# Patient Record
Sex: Female | Born: 1985 | Race: White | Hispanic: No | Marital: Single | Smoking: Never smoker
Health system: Southern US, Community
[De-identification: ages and names within clinical notes are randomized; demographics above are authoritative.]

## PROBLEM LIST (undated history)

## (undated) ENCOUNTER — Inpatient Hospital Stay: Payer: Self-pay

## (undated) DIAGNOSIS — F419 Anxiety disorder, unspecified: Secondary | ICD-10-CM

## (undated) DIAGNOSIS — Z23 Encounter for immunization: Secondary | ICD-10-CM

## (undated) DIAGNOSIS — K219 Gastro-esophageal reflux disease without esophagitis: Secondary | ICD-10-CM

## (undated) DIAGNOSIS — F339 Major depressive disorder, recurrent, unspecified: Secondary | ICD-10-CM

## (undated) DIAGNOSIS — T7840XA Allergy, unspecified, initial encounter: Secondary | ICD-10-CM

## (undated) DIAGNOSIS — J45909 Unspecified asthma, uncomplicated: Secondary | ICD-10-CM

## (undated) DIAGNOSIS — I881 Chronic lymphadenitis, except mesenteric: Secondary | ICD-10-CM

## (undated) DIAGNOSIS — R040 Epistaxis: Secondary | ICD-10-CM

## (undated) DIAGNOSIS — F1411 Cocaine abuse, in remission: Secondary | ICD-10-CM

## (undated) DIAGNOSIS — F900 Attention-deficit hyperactivity disorder, predominantly inattentive type: Secondary | ICD-10-CM

## (undated) DIAGNOSIS — F329 Major depressive disorder, single episode, unspecified: Secondary | ICD-10-CM

## (undated) DIAGNOSIS — Z8041 Family history of malignant neoplasm of ovary: Secondary | ICD-10-CM

## (undated) DIAGNOSIS — F4310 Post-traumatic stress disorder, unspecified: Secondary | ICD-10-CM

## (undated) DIAGNOSIS — G44209 Tension-type headache, unspecified, not intractable: Secondary | ICD-10-CM

## (undated) DIAGNOSIS — Z789 Other specified health status: Secondary | ICD-10-CM

## (undated) HISTORY — DX: Anxiety disorder, unspecified: F41.9

## (undated) HISTORY — DX: Allergy, unspecified, initial encounter: T78.40XA

## (undated) HISTORY — DX: Attention-deficit hyperactivity disorder, predominantly inattentive type: F90.0

## (undated) HISTORY — DX: Cocaine abuse, in remission: F14.11

## (undated) HISTORY — DX: Major depressive disorder, recurrent, unspecified: F33.9

## (undated) HISTORY — DX: Encounter for immunization: Z23

## (undated) HISTORY — DX: Chronic lymphadenitis, except mesenteric: I88.1

## (undated) HISTORY — DX: Family history of malignant neoplasm of ovary: Z80.41

## (undated) HISTORY — DX: Post-traumatic stress disorder, unspecified: F43.10

## (undated) HISTORY — DX: Major depressive disorder, single episode, unspecified: F32.9

## (undated) HISTORY — DX: Tension-type headache, unspecified, not intractable: G44.209

## (undated) HISTORY — PX: LYMPHADENECTOMY: SHX5960

## (undated) HISTORY — DX: Gastro-esophageal reflux disease without esophagitis: K21.9

## (undated) HISTORY — DX: Epistaxis: R04.0

---
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
## 2008-02-18 ENCOUNTER — Emergency Department (HOSPITAL_COMMUNITY): Admission: EM | Admit: 2008-02-18 | Discharge: 2008-02-18 | Payer: Self-pay | Admitting: Emergency Medicine
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
## 2008-03-09 ENCOUNTER — Emergency Department: Payer: Self-pay | Admitting: Emergency Medicine
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
## 2008-07-22 ENCOUNTER — Ambulatory Visit: Payer: Self-pay | Admitting: Unknown Physician Specialty
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
## 2008-07-30 ENCOUNTER — Ambulatory Visit: Payer: Self-pay | Admitting: Unknown Physician Specialty

## 2008-08-07 ENCOUNTER — Ambulatory Visit: Payer: Self-pay | Admitting: Unknown Physician Specialty

## 2008-09-14 ENCOUNTER — Emergency Department: Payer: Self-pay | Admitting: Emergency Medicine

## 2008-09-17 ENCOUNTER — Emergency Department: Payer: Self-pay | Admitting: Emergency Medicine

## 2008-11-21 HISTORY — PX: LAPAROSCOPY: SHX197

## 2008-12-28 ENCOUNTER — Emergency Department: Payer: Self-pay | Admitting: Emergency Medicine
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
## 2009-08-12 ENCOUNTER — Emergency Department: Payer: Self-pay | Admitting: Emergency Medicine
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
## 2009-08-14 ENCOUNTER — Other Ambulatory Visit: Payer: Self-pay | Admitting: Unknown Physician Specialty

## 2010-04-14 ENCOUNTER — Inpatient Hospital Stay: Payer: Self-pay | Admitting: Obstetrics and Gynecology

## 2010-04-19 ENCOUNTER — Ambulatory Visit: Payer: Self-pay

## 2010-06-28 ENCOUNTER — Emergency Department: Payer: Self-pay | Admitting: Emergency Medicine

## 2011-01-18 ENCOUNTER — Encounter: Payer: Self-pay | Admitting: Internal Medicine

## 2011-01-20 ENCOUNTER — Encounter: Payer: Self-pay | Admitting: Internal Medicine

## 2011-02-20 ENCOUNTER — Encounter: Payer: Self-pay | Admitting: Internal Medicine

## 2011-04-08 NOTE — Discharge Summary (Signed)
NAME:  [name], [name]                         ACCOUNT NO.:  [id number]   MEDICAL RECORD NO.:  [id number]                   PATIENT TYPE:  INP   LOCATION:  0105                                 FACILITY:  [facility]   PHYSICIAN:  Beverly Milch, MD                  DATE OF BIRTH:  05-17-1986   DATE OF ADMISSION:  03/29/2004  DATE OF DISCHARGE:  04/05/2004                                 DISCHARGE SUMMARY   IDENTIFYING DATA:  This 25 year old female 10th grade student at [employer] in [place] was admitted voluntarily emergently on  referral from [facility] for inpatient stabilization  of suicide risk, depression, and medical and psychological consequences of  alcohol.  She reported a several month history of progressive depression.  She discontinued her Depakote three months ago for bipolar disorder and  declined to resume it, stating she could not tolerate that medication.  She  is in therapy with [name] with last appointment Mar 24, 2004.  She had  a suicide plan to cut herself and had last cut one year ago.  For full  details, please see the typed admission assessment.   HISTORY OF PRESENT ILLNESS:  The patient would elaborate little on the above  history.  She seemed to likely receive pharmacotherapy in the past from  Leim Fabry.  She noted that she does take Seroquel episodically though  mainly now just for sleep when needed but not regularly, usually taking two  or three of the 25 mg tablets.  She had been treated with Effexor, Prozac,  and Zoloft in the past and noted that antidepressant did not seem to work.  She had been hospitalized at [facility] in March 2004.  Her last  heavy drinking episode was Mar 28, 2004, and she notes a 6 pound weight loss,  upset stomach, and inability to sleep with low energy that seemed likely  consequences of alcohol and mood disorder.  She expects withdrawal symptoms.  There is a family  history of substance abuse apparently in both parents who  also have depression but they were unaware of the patient's drinking.  Both  parents are currently sober and extended family has a history of substance  abuse as well.  Mother is taking lorazepam and Xanax for her depression.  Father apparently is on no medications.  Both parents have significant  depression associated with the death of the patient's brother when the  patient was 68 years of age and the brother was 8 in 55, being run over by  a school bus.  The patient had breakup with a boyfriend of five months February 29, 2004.   INITIAL MENTAL STATUS EXAM:  The patient had moderate to severe dysphoria  and no manic or mixed features.  She had atypical and melancholic features.  She tended to be quiet and reserved while also self-defeating  in her careful  revelation of partial information.  She did acknowledge suicidal ideation.  She had been fearful in the past of something in her room and would check  under the bed but had stopped doing this three months ago.  She was binge  drinking including vodka heavily for the last few months and started alcohol  at age 72.  She would not discuss other drugs much but subsequently later  did admit to using ecstasy at times.  Any withdrawal symptoms seemed to be  also possibly flashbacks from previous ecstasy use.   LABORATORY FINDINGS:  CBC was normal except hematocrit borderline low at  34.7 with reference range 36-49 and hemoglobin borderline at 12.1 with  reference range 12-16.  White count was normal at 6800, MCV at 87, and  platelet count 293,000.  Comprehensive metabolic panel was normal with  sodium 135, potassium 3.8, glucose 78, creatinine 0.7, calcium 9.4, albumin  3.5, AST 16, and ALT 17.  GGT was normal at 7.  TSH was normal at 0.605.  RPR was nonreactive.  Urine drug screen was positive for propoxyphene,  otherwise negative at the time of admission with propoxyphene  quantitated at  2600 ng/mL.  The patient then did clarify that mother had given her one of  mother's Darvocet's for back pain before admission.  Urine pregnancy test  was negative.  Urinalysis was normal except for a poor clean catch with  small amount of leukocyte esterase, many epithelial and bacteria, 3-6 wbc's,  presence of mucus, and specific gravity of 1.018.  She was asymptomatic in  this regard.  Urine probe for gonorrhea and Chlamydia trachomatis by DNA  amplification were both negative.   HOSPITAL COURSE AND TREATMENT:  General medical exam by Vic Ripper,  P.A.-C., noted that the patient takes Sudafed and her birth control pill.  She allergic to PENICILLIN.  She noted that her paternal first cousin has  bipolar disorder.  She had menarche at age 51.  She had scars on the right  thigh from previous self-cutting and had her last GYN exam six months ago  with Dr. Dayna Barker.  Vital signs were normal throughout hospital stay with  height 62 inches and weight 105 pounds on admission, blood pressure 116/80  with heart rate of 107 sitting and standing blood pressure 121/82 with heart  rate 112.  At the time of discharge, the patient's weight was 106.5 pounds  and her blood pressure was 110/77 with heart rate 98 sitting and 108/75 with  heart rate of 128 standing.  The patient had several times of complaining of  shakiness, apprehensions about capacity to control herself, inability to  sleep, and vague misperceptions associated with craving alcohol that seemed  to be low dose withdrawal symptoms and flashbacks of intoxication combined.  She did not manifest a physiologic withdrawal requiring benzodiazepines but  was monitored closely for such.  The patient required to sleep in the quiet  room next to the nursing station on one of these occasions.  She was started  on Cymbalta 20 mg in the morning and Lamictal 25 mg at bedtime for treatment of her bipolar depression.  She did not  tolerate Lamictal as she reportedly  had muscle fatigue and aches that caused she and mother to feel would be  only due to the Lamictal.  She was switched to Neurontin and grandmother  does take Neurontin okay.  Neurontin was titrated up to 1200 mg nightly and  tolerated well.  She still had some difficulty sleeping toward the end of  the hospitalization and anticipated the need to use her Seroquel p.r.n. at  home for this.  Family therapy was carried out with mother.  The patient  participated in substance abuse, group, milieu, behavioral, individual,  special education, anger management, and occupational therapeutic  recreational therapies during the hospital stay.  She and mother addressed  the changes in lifestyle at home for the patient.  The patient feels  apprehensive about not being able to go out with her friends when she wants  to but mother is being more structured about ensuring sobriety.  The patient  seems to appreciate mother's concern as well.  Mother had to go to court on  the day of discharge so that the family therapy session at the time of  discharge was brief.  The patient pledged compliance and demonstrated  knowledge in what she has to do to continue her recovery.  She was  discharged in improved condition free of suicidal ideation.   FINAL DIAGNOSES:   AXIS I:  1. Bipolar disorder, depressed, severe.  2. Alcohol dependence.  3. Psychoactive substance abuse, not otherwise specified.  4. Noncompliance with treatment.  5. Identity disorder with passive-aggressive features.  6. Other specified family circumstances.  7. Parent-child problem.  8. Other interpersonal problems.   AXIS II:  Diagnosis deferred.   AXIS III:  1. Allergy to penicillin.  2. Seasonal allergic rhinitis.  3. Birth control pills.  4. Weight loss.  5. History of fibrocystic disease.  6. Possible mild alcohol gastritis.  7. Borderline anemia.   AXIS IV:  Stressors: Family- severe,  chronic; peer relations and school-  moderate, acute; phase of life- severe, chronic.   AXIS V:  Global assessment of functioning at the time of admission was 35  with highest in the last year 65 and discharge global assessment of  functioning 54.   PLAN:  The patient was making excellent progress at the time of discharge.  She was more sincere in her self-direction of sobriety and recovery as well  as being able to communicate better with parents.  She had no suicidal  ideation at the time of discharge including no medication associated suicide  side effects.  She was discharged on the following medications:  1. Cymbalta 20 mg every morning, quantity #30 with one refill prescribed.  2. Neurontin 600 mg tablet to take two tablets or 1200 mg every bedtime,     quantity #60 with one refill.  3. Seroquel 25 mg tablet using two at bedtime if needed for insomnia, having     a supply at home.  4. Birth control pill to take one daily, own home supply.  AA was recommended and the patient and mother committed that the patient  would attend.  The patient will see [name] for therapy with  appointment to be made as of Apr 08, 2004, as the office is closed until  that time for individual and family therapy as well as substance abuse  treatment.  She will see Jasmine Pang, M.D., for psychiatric followup  and that office will also need to schedule the patient appointment with  mother though appointment was facilitated in every way possible.  Crisis and  safety plans are established if needed.  She is discharged on a bland but  progressive diet and has no restrictions on activity.  There is a signed  release on the chart for both courtesy copies.  Beverly Milch, MD    GJ/MEDQ  D:  04/06/2004  T:  04/06/2004  Job:  [id number]   cc:   Jasmine Pang, M.D.  Fax: [phone]   [name]  [facility]  [address] [place], [place] [address]
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
## 2011-04-22 ENCOUNTER — Emergency Department: Payer: Self-pay | Admitting: Unknown Physician Specialty

## 2011-06-06 ENCOUNTER — Ambulatory Visit: Payer: Self-pay | Admitting: Internal Medicine

## 2011-06-22 ENCOUNTER — Ambulatory Visit: Payer: Self-pay | Admitting: Internal Medicine

## 2011-07-12 ENCOUNTER — Emergency Department: Payer: Self-pay | Admitting: *Deleted

## 2011-09-09 ENCOUNTER — Ambulatory Visit: Payer: Self-pay | Admitting: Internal Medicine

## 2011-09-09 IMAGING — US TRANSABDOMINAL ULTRASOUND OF PELVIS
1 series · 17 of 25 positions shown · non-contrast
Comparison: none

REASON FOR EXAM: pelvic pain
COMMENTS:

[Series 1: transabdominal ultrasound of pelvis · 17 of 76 slices shown]
[im 1/76]
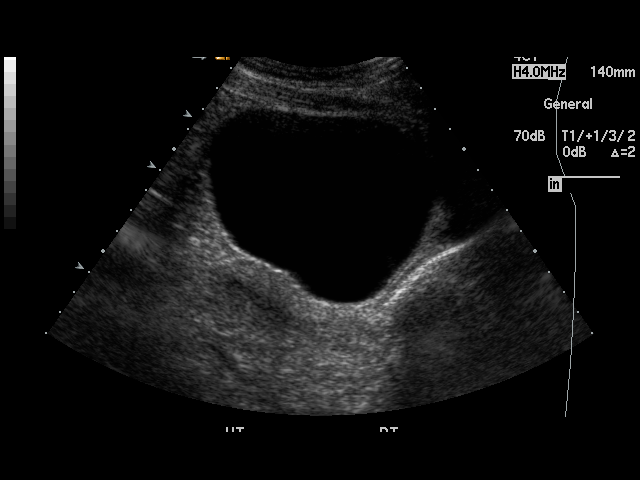
[im 7/76]
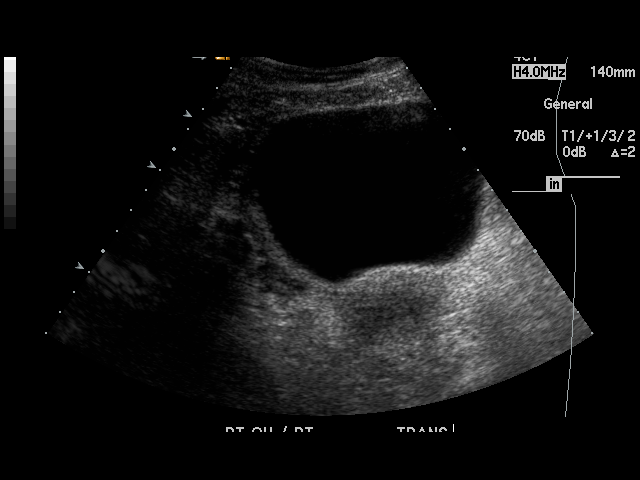
[im 10/76]
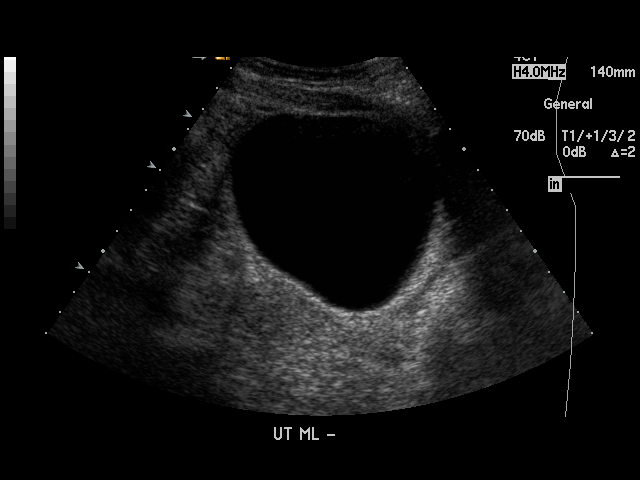
[im 16/76]
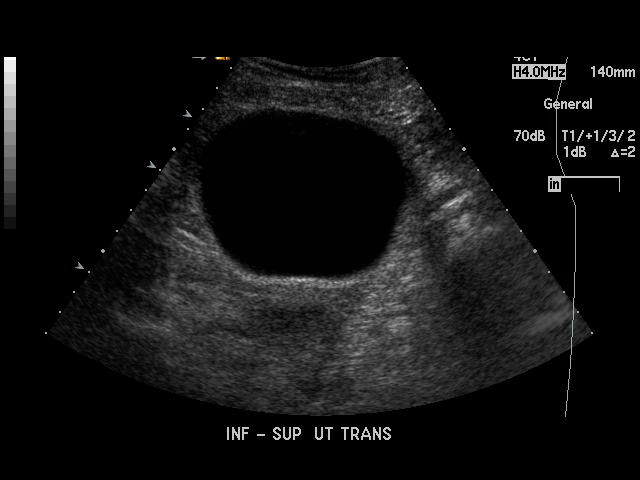
[im 19/76]
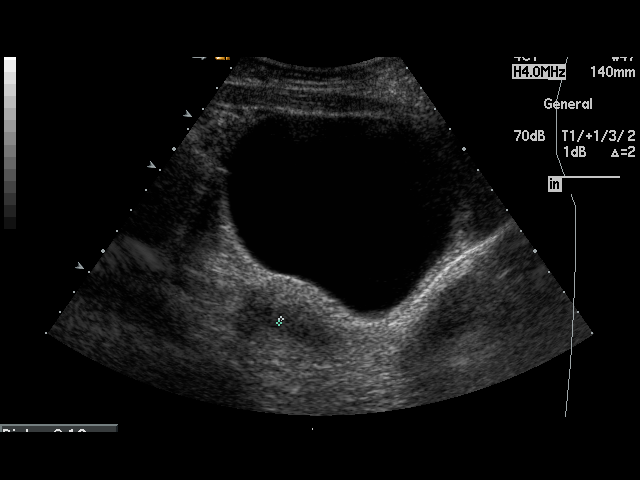
[im 26/76]
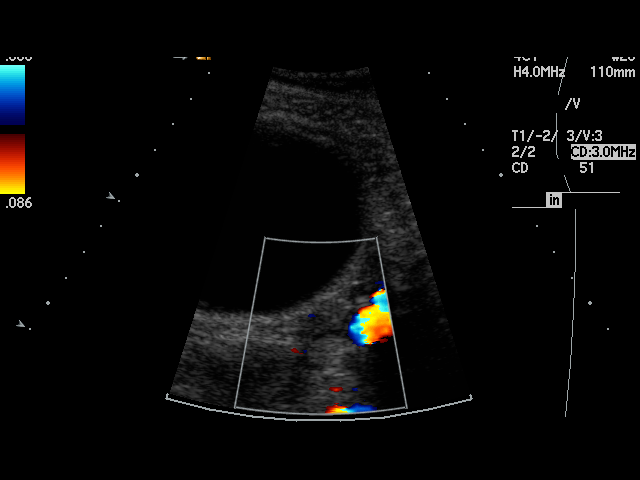
[im 29/76]
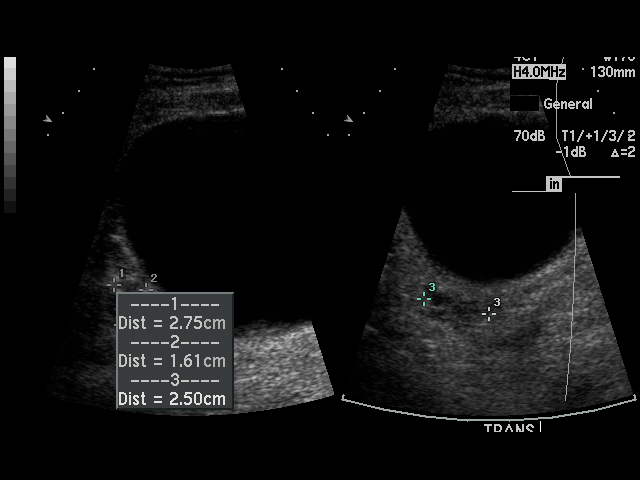
[im 35/76]
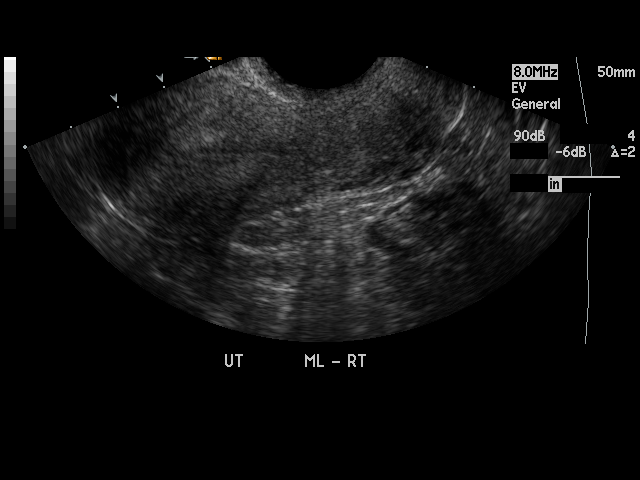
[im 38/76]
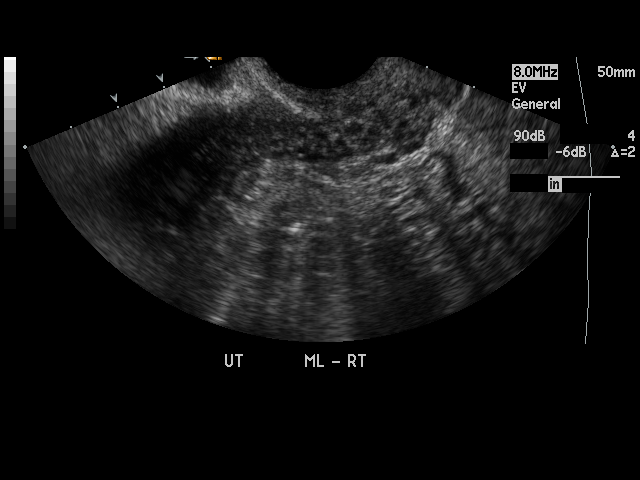
[im 41/76]
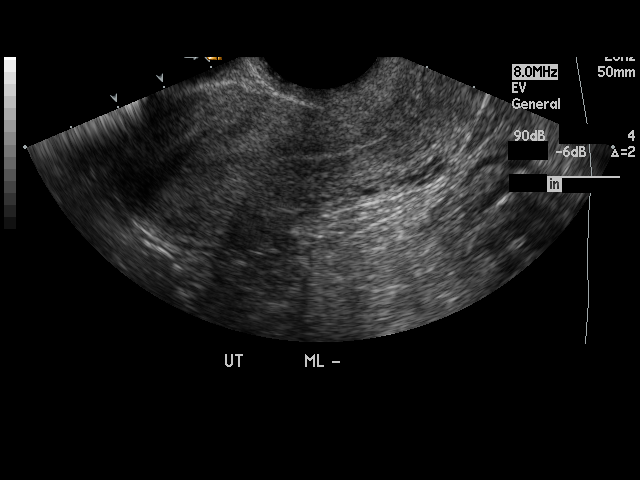
[im 47/76]
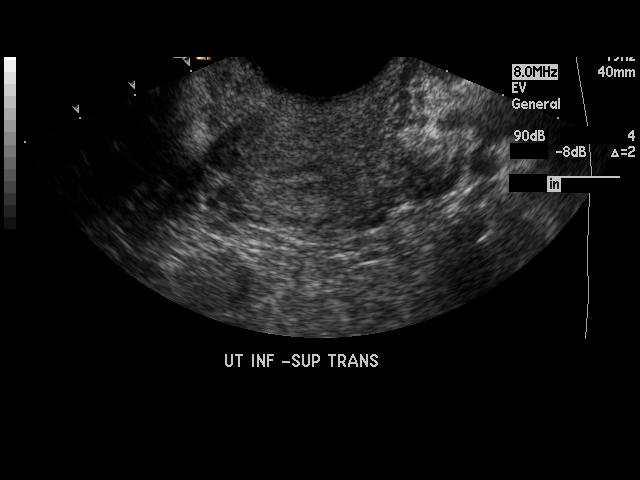
[im 51/76]
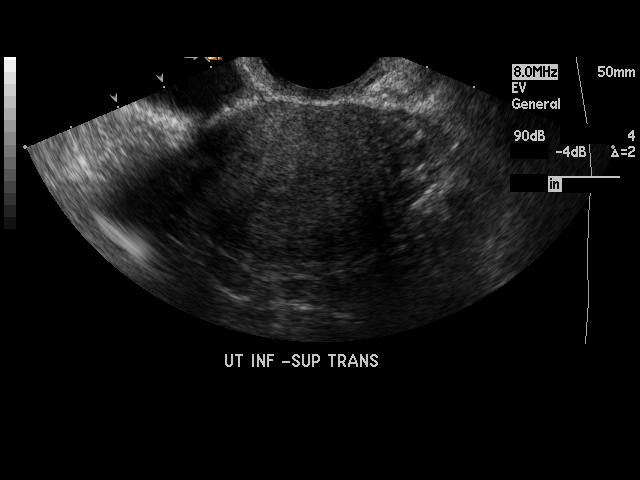
[im 57/76]
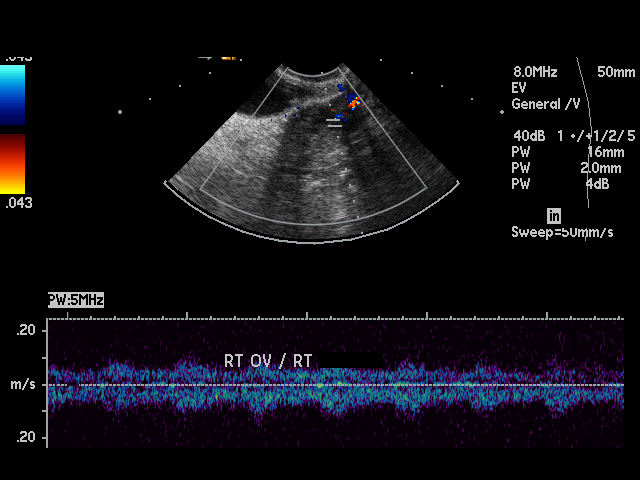
[im 60/76]
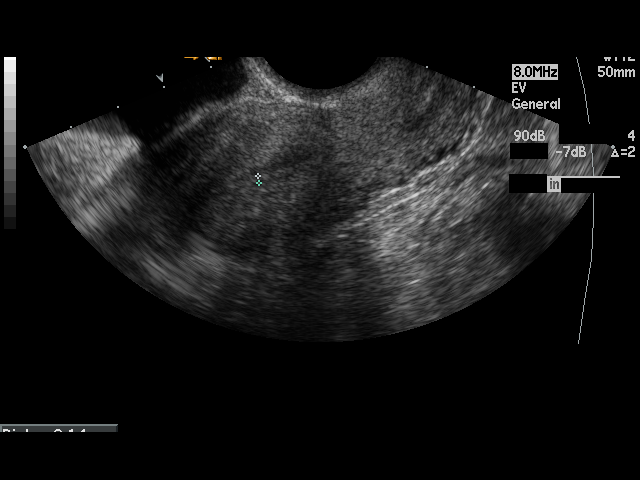
[im 66/76]
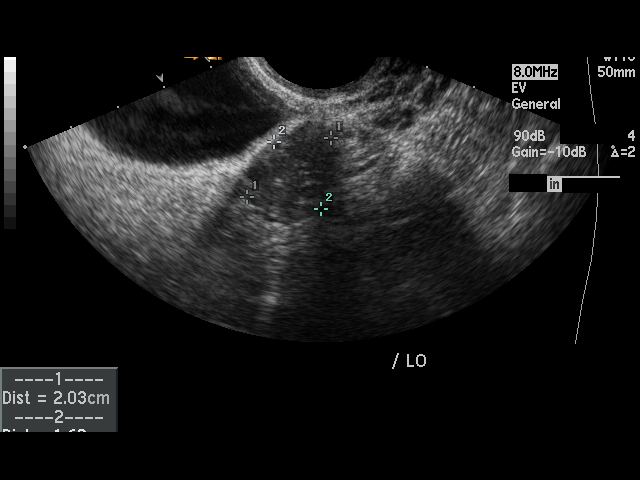
[im 69/76]
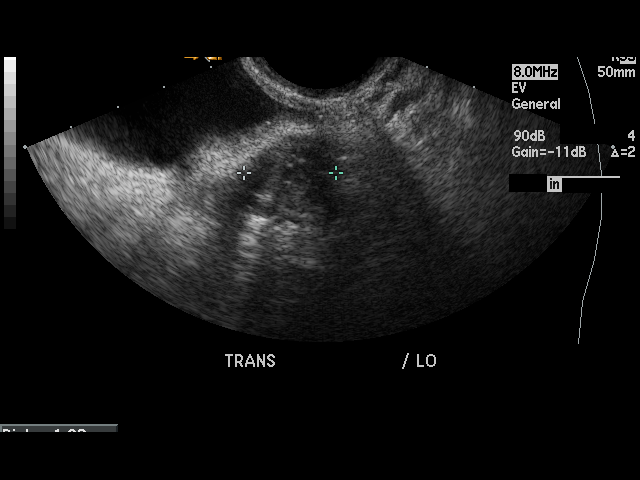
[im 76/76]
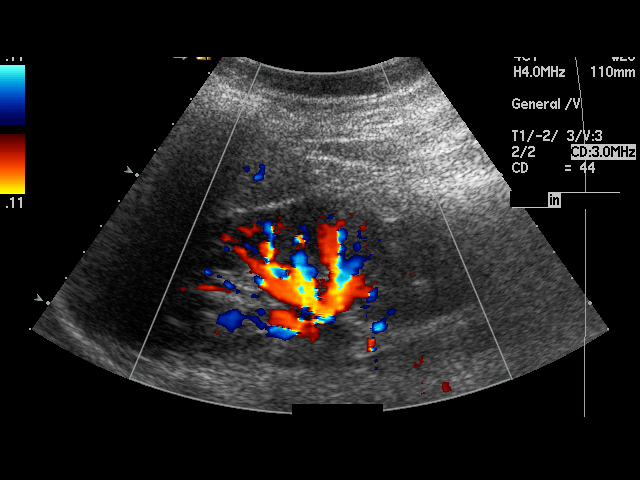

[17 of 25 positions shown; findings below may reference images not displayed]

PROCEDURE:     OMAIR - OMAIR PELVIS NON-OB W/TRANSVAGINAL  - [DATE]  [DATE]

RESULT:     Transabdominal and endovaginal ultrasound was performed. The
uterus measures 7.21 cm x 3.26 cm x 4.73 cm. No uterine mass is seen. The
endometrium measures 1.4 mm in thickness. The right and left ovaries are
visualized. The right ovary measures 2.75 cm at maximum diameter and the
left ovary measures 2.57 cm at maximum diameter. Vascular flow is seen in
each ovary on Doppler examination. No free fluid is seen in the pelvis.
There is slight prominence of the upper and lower infundibulum of the right
kidney compatible with mild ectasia. No dilatation of the renal pelvis is
seen to suggest hydronephrosis. The left kidney is normal in appearance
sonographically.
IMPRESSION: Normal study except for mild ectasia of the right renal collecting system.
This was not present on a prior exam of [DATE]. The possibility of
inflammation or reflux cannot be ruled out.

## 2012-05-23 ENCOUNTER — Ambulatory Visit: Payer: Self-pay | Admitting: General Surgery

## 2012-05-23 LAB — CBC WITH DIFFERENTIAL/PLATELET
Basophil %: 0.3 %
HGB: 11.8 g/dL — ABNORMAL LOW (ref 12.0–16.0)
Lymphocyte #: 2.6 10*3/uL (ref 1.0–3.6)
Lymphocyte %: 23.1 %
MCHC: 34.4 g/dL (ref 32.0–36.0)
MCV: 93 fL (ref 80–100)
Monocyte %: 5.5 %
Neutrophil #: 7.7 10*3/uL — ABNORMAL HIGH (ref 1.4–6.5)
RBC: 3.67 10*6/uL — ABNORMAL LOW (ref 3.80–5.20)

## 2012-05-28 ENCOUNTER — Ambulatory Visit: Payer: Self-pay | Admitting: General Surgery

## 2012-06-01 LAB — PATHOLOGY REPORT

## 2012-09-29 ENCOUNTER — Emergency Department: Payer: Self-pay | Admitting: Internal Medicine

## 2012-09-29 IMAGING — CR DG SHOULDER 3+V*L*
1 series · 3 of 3 positions shown · non-contrast
Comparison: None

REASON FOR EXAM: pain
COMMENTS:

PROCEDURE:     DXR - DXR SHOULDER LEFT COMPLETE  - [DATE] [DATE]
RESULT:     History: Left arm pain

[Series 1: w shoulder external left · 0.14mm/px · 3 of 3 slices shown]
[im 1/3]
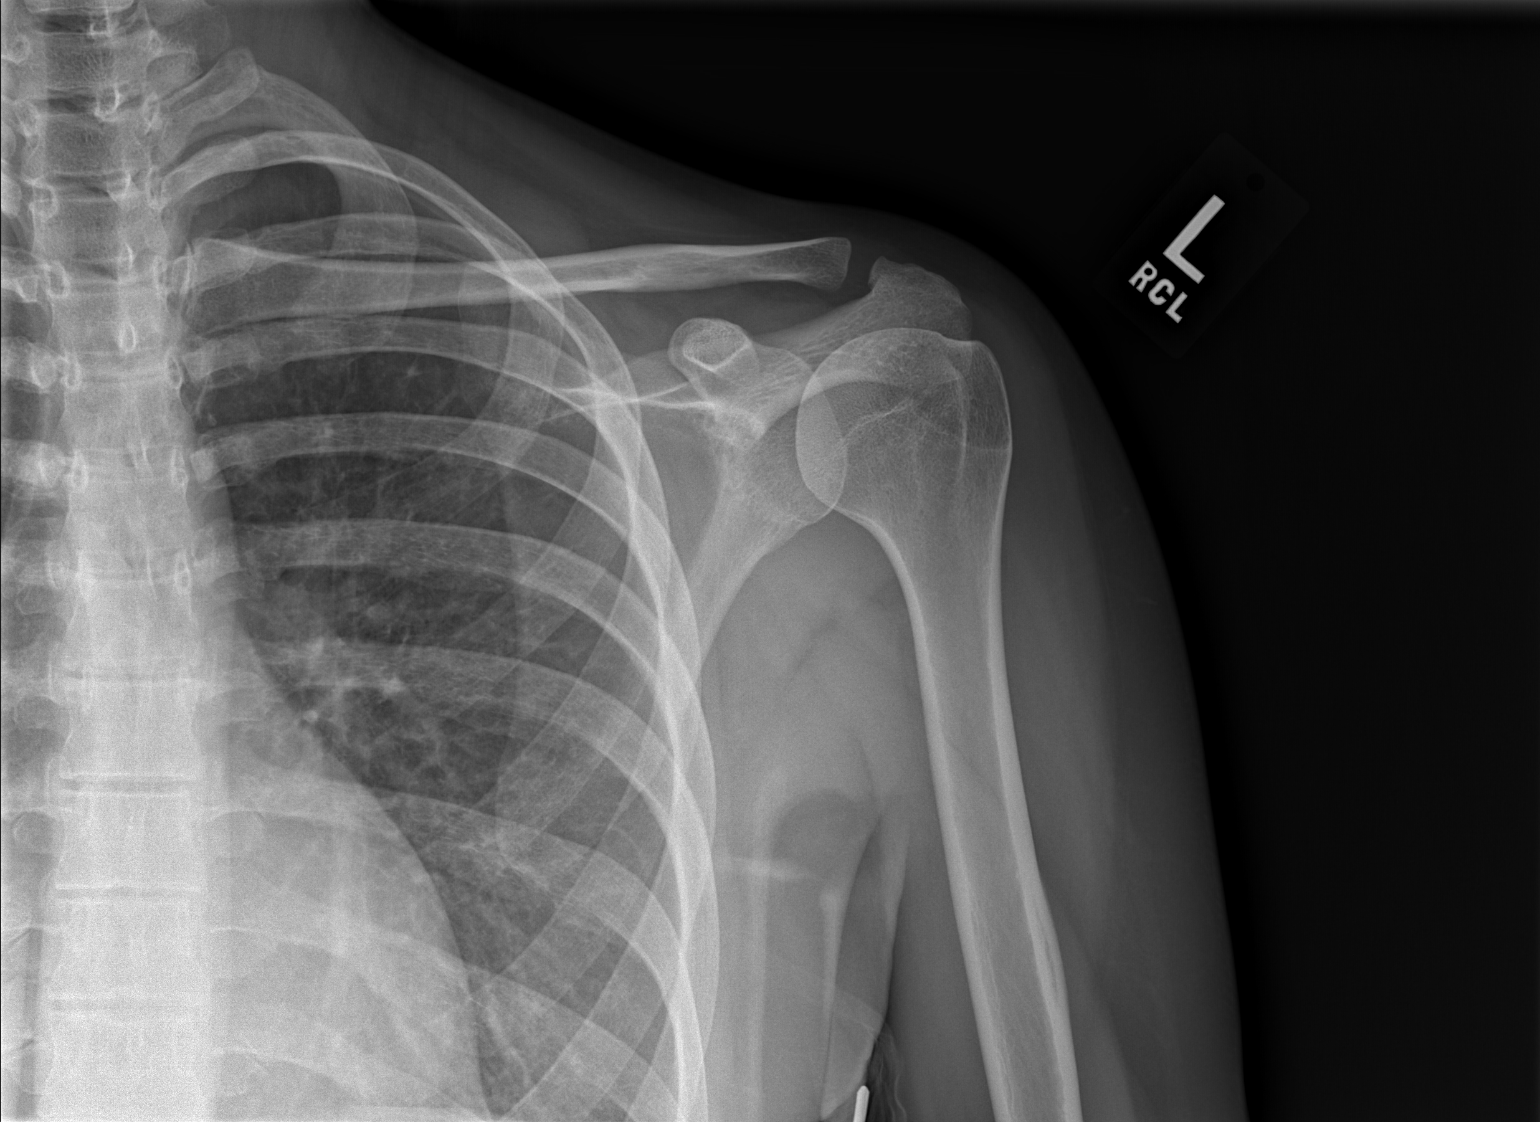
[im 2/3]
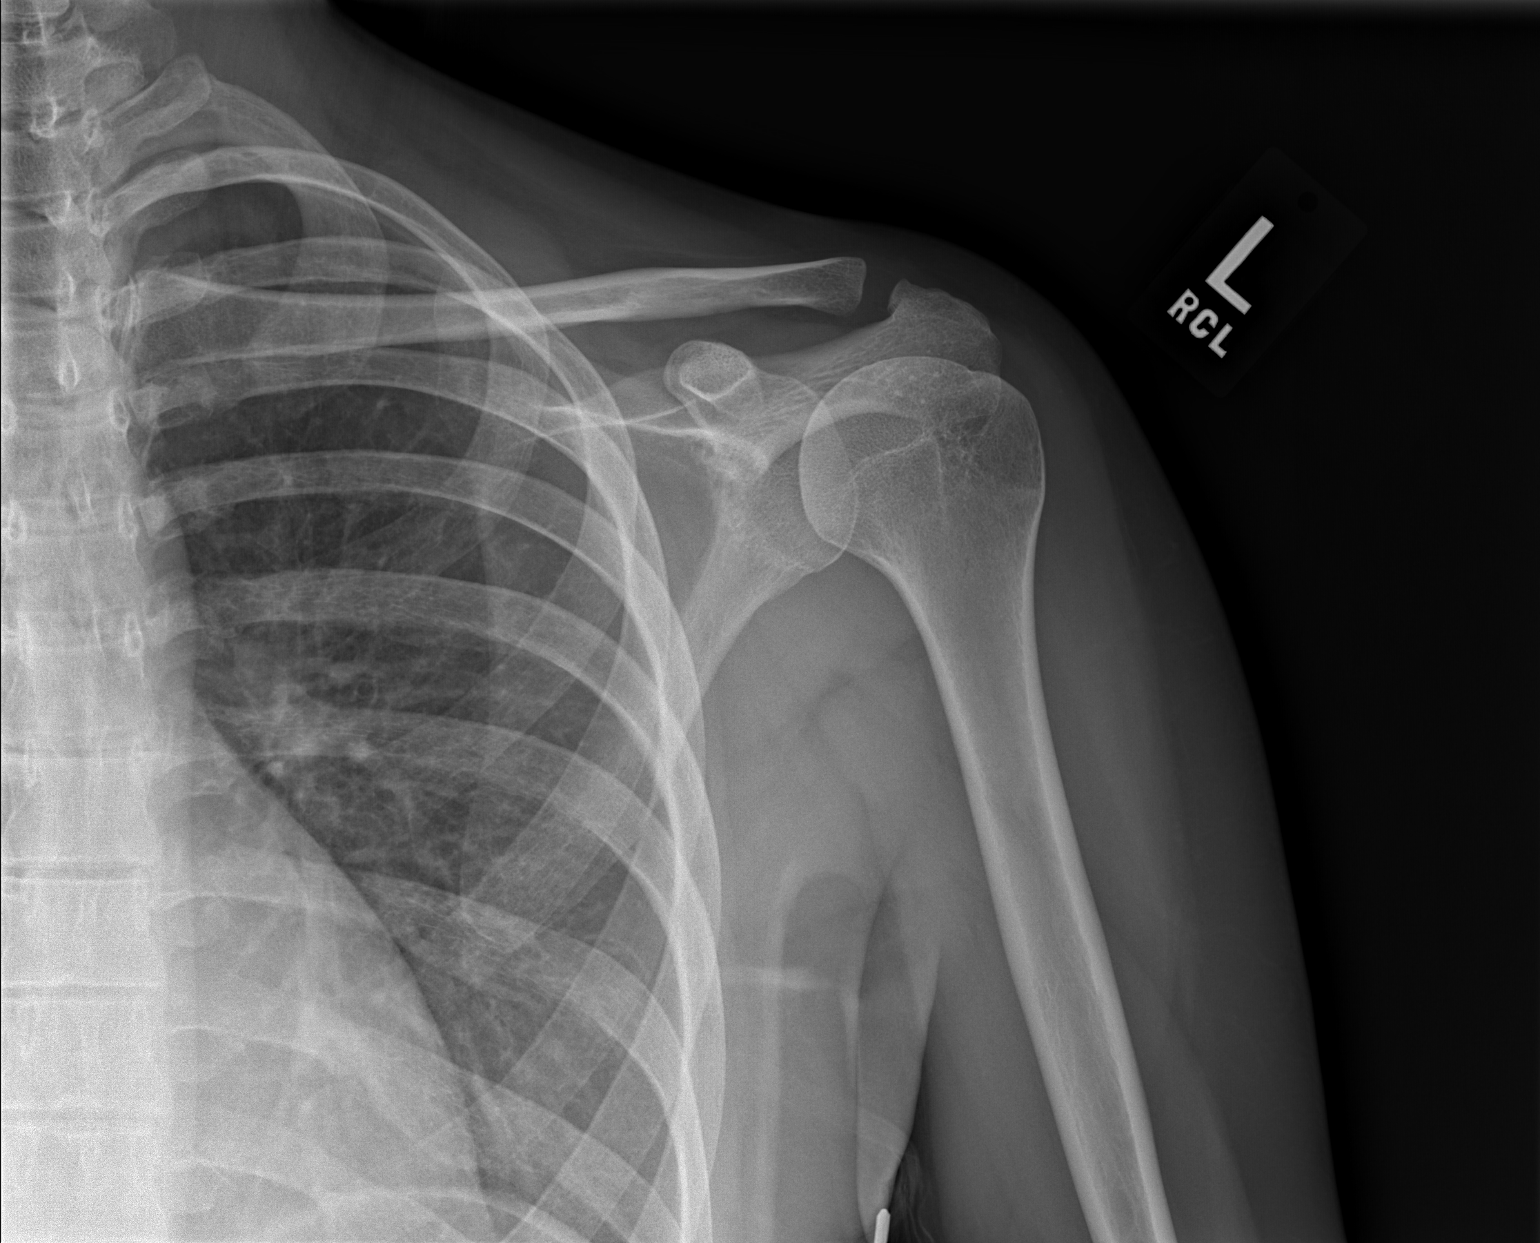
[im 3/3]
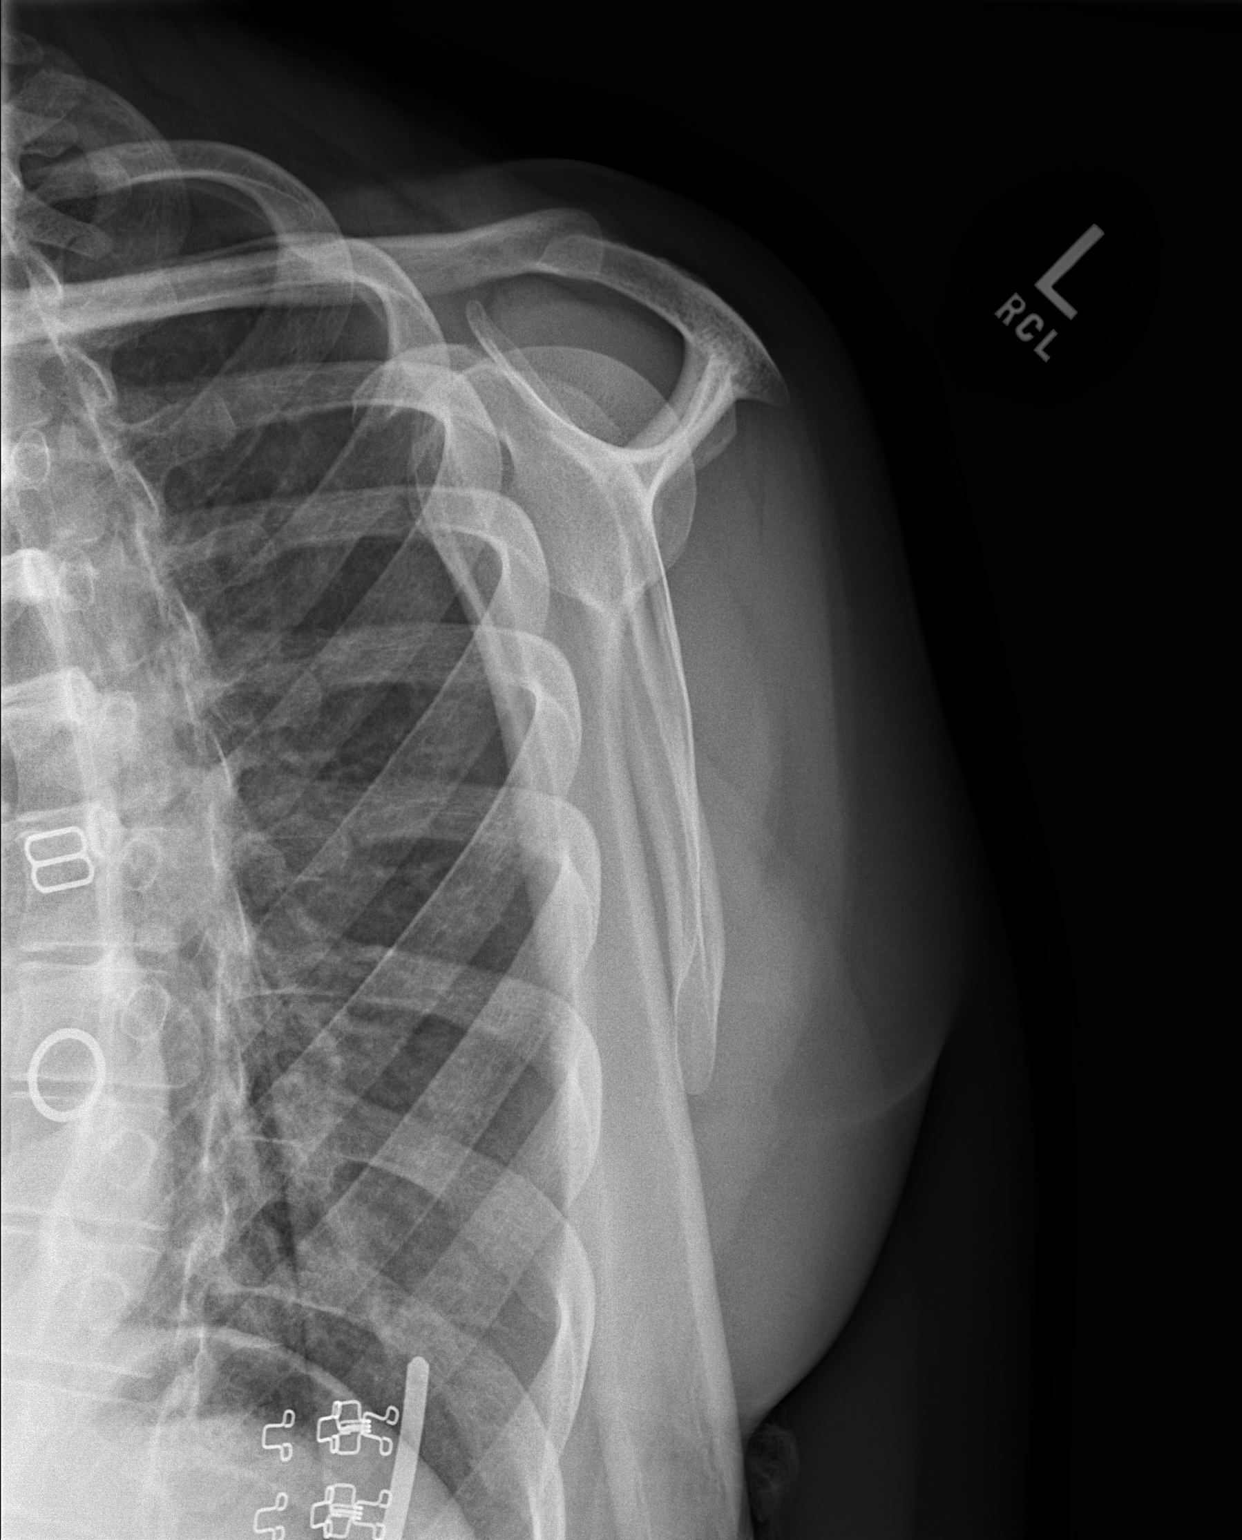

[3 of 3 positions shown; findings below may reference images not displayed]

FINDINGS: 3 views of the left shoulder demonstrates no fracture or dislocation. The
acromioclavicular joint is normal.
IMPRESSION: No acute osseous injury of the left shoulder.

[REDACTED]

## 2013-05-21 IMAGING — CR DG HUMERUS 2V *L*
1 series · 2 of 2 positions shown · non-contrast
Comparison: none

REASON FOR EXAM: pain
COMMENTS:

PROCEDURE:     DXR - DXR HUMERUS LEFT  - [DATE] [DATE]
RESULT:     Findings: 2 views of the left humerus demonstrate no fracture or
dislocation.

[Series 1: w humerus ap left · 0.14mm/px · 2 of 2 slices shown]
[im 1/2]
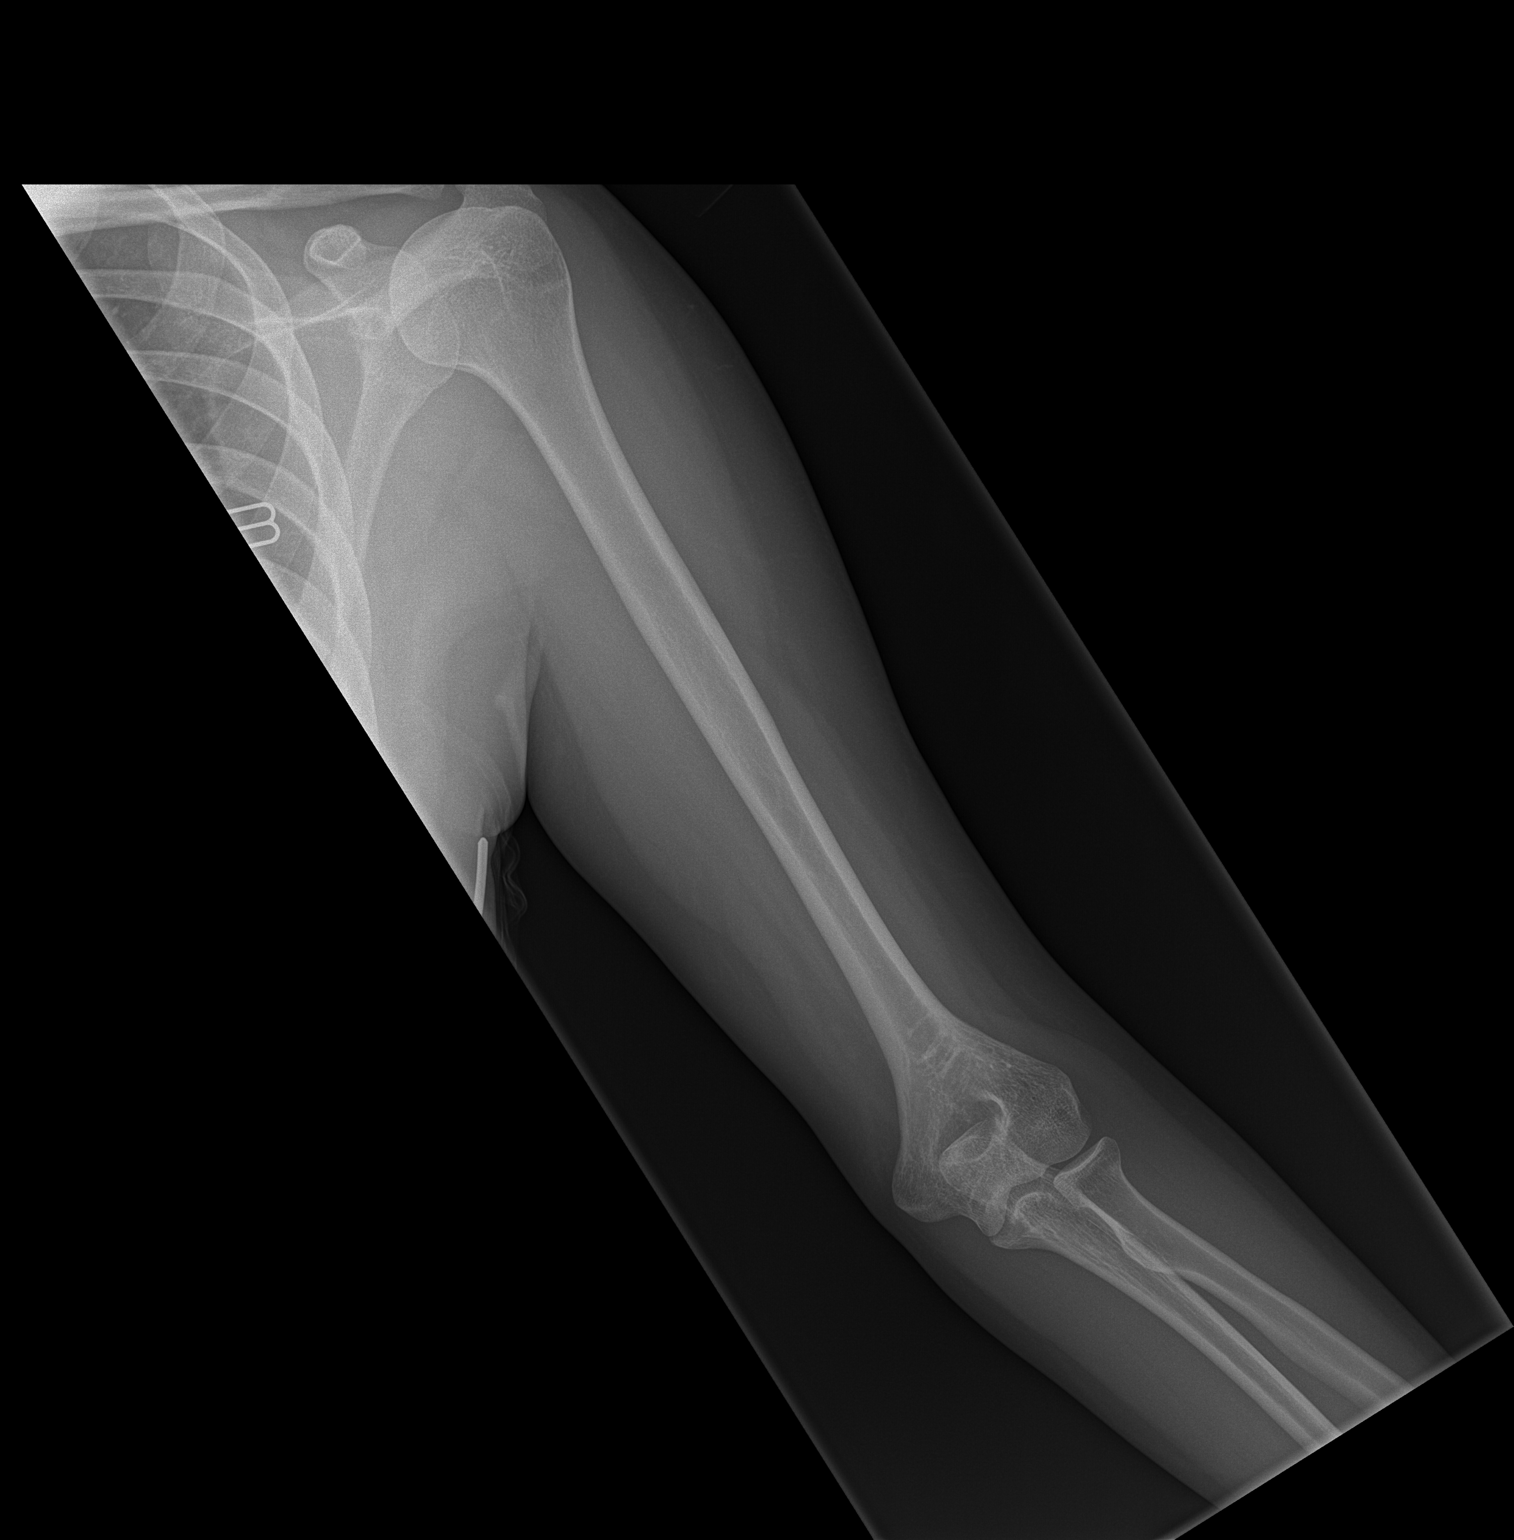
[im 2/2]
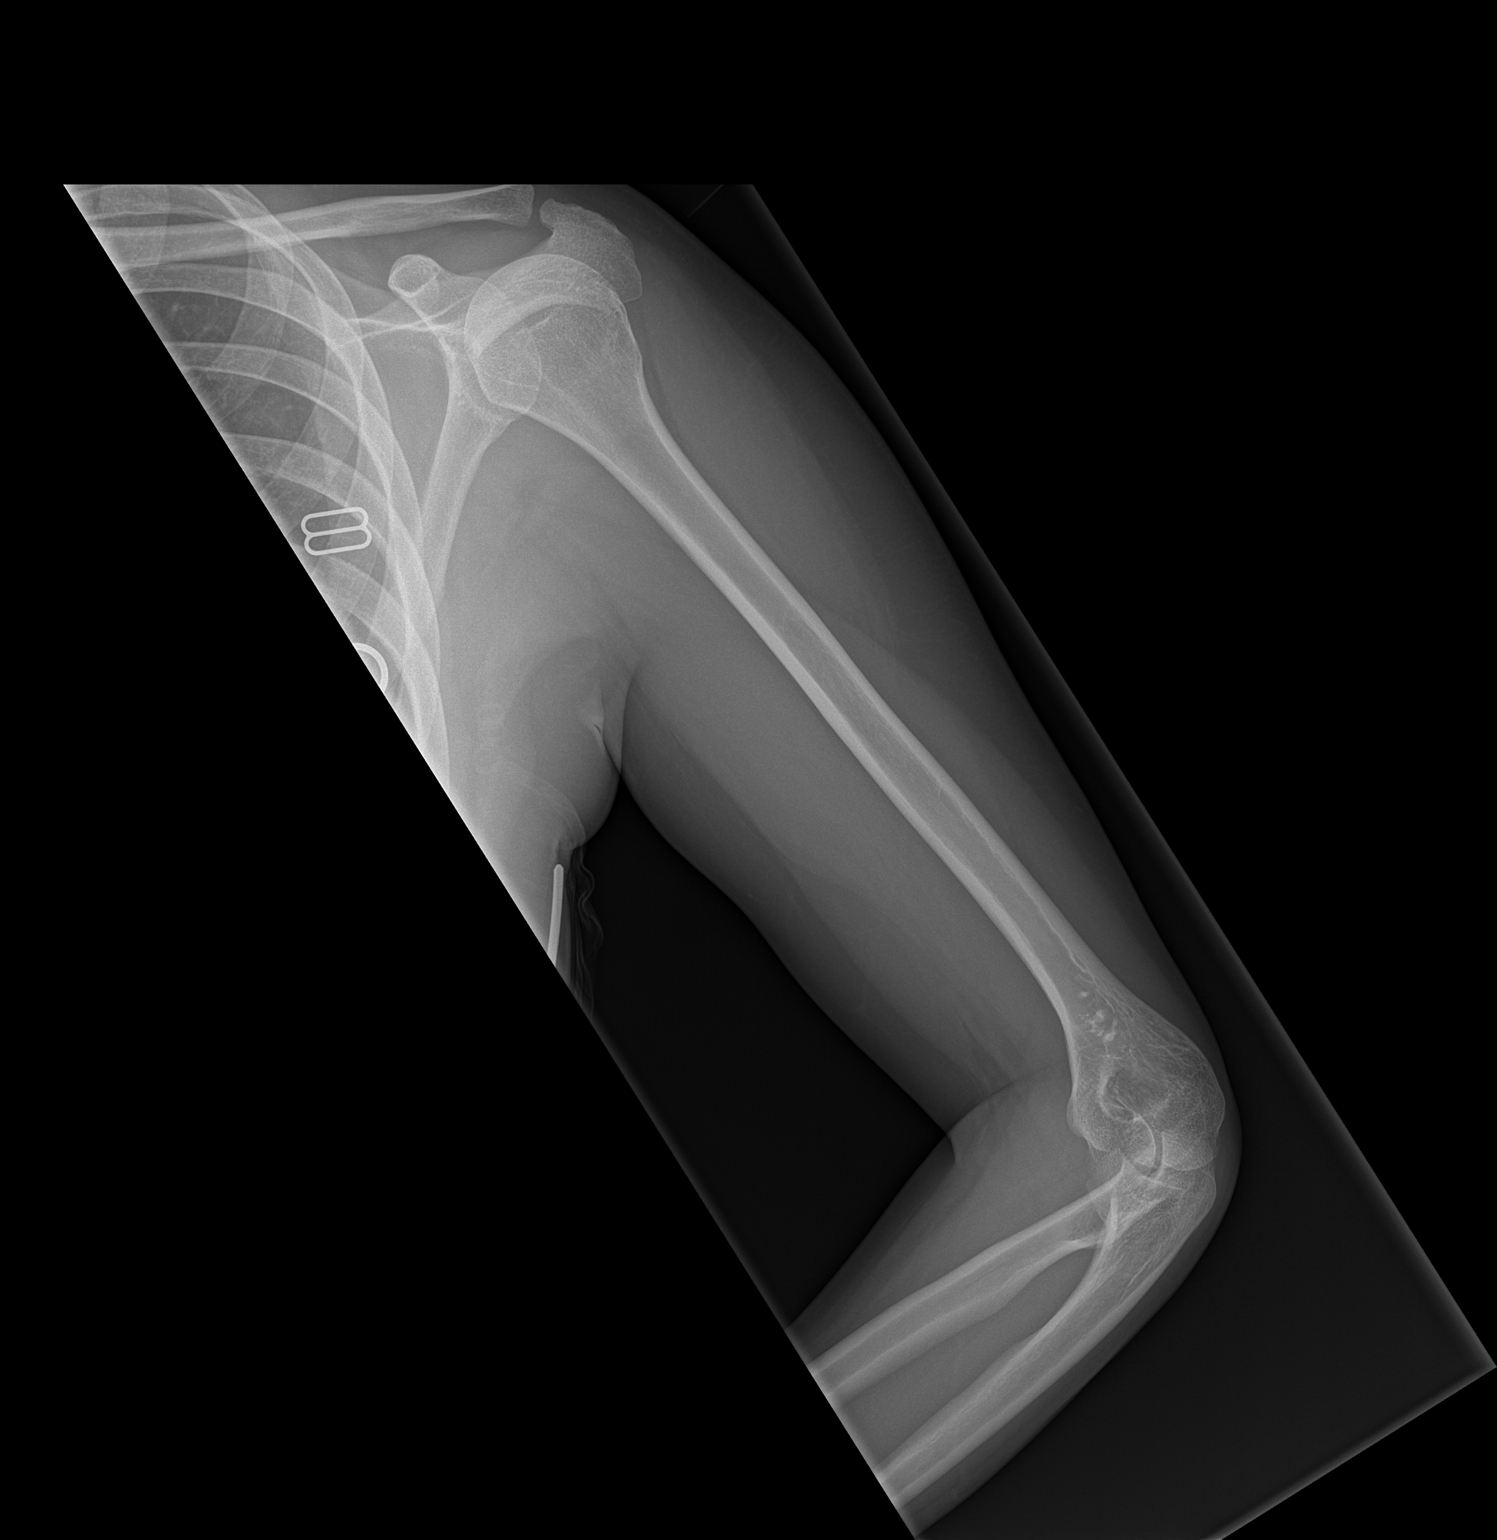

[2 of 2 positions shown; findings below may reference images not displayed]

IMPRESSION: No acute osseous injury of the left humerus.

## 2013-11-21 LAB — HM PAP SMEAR

## 2013-12-03 ENCOUNTER — Emergency Department: Payer: Self-pay | Admitting: Emergency Medicine

## 2013-12-03 LAB — URINALYSIS, COMPLETE
Bacteria: NONE SEEN
Bilirubin,UR: NEGATIVE
GLUCOSE, UR: NEGATIVE mg/dL (ref 0–75)
Nitrite: NEGATIVE
Ph: 5 (ref 4.5–8.0)
Specific Gravity: 1.03 (ref 1.003–1.030)

## 2013-12-03 LAB — CBC
HCT: 40 % (ref 35.0–47.0)
HGB: 13.6 g/dL (ref 12.0–16.0)
MCH: 31.3 pg (ref 26.0–34.0)
MCHC: 34 g/dL (ref 32.0–36.0)
MCV: 92 fL (ref 80–100)
PLATELETS: 306 10*3/uL (ref 150–440)
RBC: 4.35 10*6/uL (ref 3.80–5.20)
RDW: 12.5 % (ref 11.5–14.5)
WBC: 12 10*3/uL — ABNORMAL HIGH (ref 3.6–11.0)

## 2013-12-03 LAB — HCG, QUANTITATIVE, PREGNANCY: BETA HCG, QUANT.: 9490 m[IU]/mL — AB

## 2013-12-04 IMAGING — US US OB < 14 WEEKS - US OB TV
1 series · 14 of 28 positions shown · non-contrast
Comparison: [DATE]

CLINICAL DATA: Vaginal bleeding and pregnancy.

EXAM:
OBSTETRIC <14 WK US AND TRANSVAGINAL OB US
TECHNIQUE: Both transabdominal and transvaginal ultrasound examinations were
performed for complete evaluation of the gestation as well as the
maternal uterus, adnexal regions, and pelvic cul-de-sac.
Transvaginal technique was performed to assess early pregnancy.

[Series 1: us ob < 14 weeks - us ob tv · 0.17mm/px · 14 of 46 slices shown]
[im 2/46]
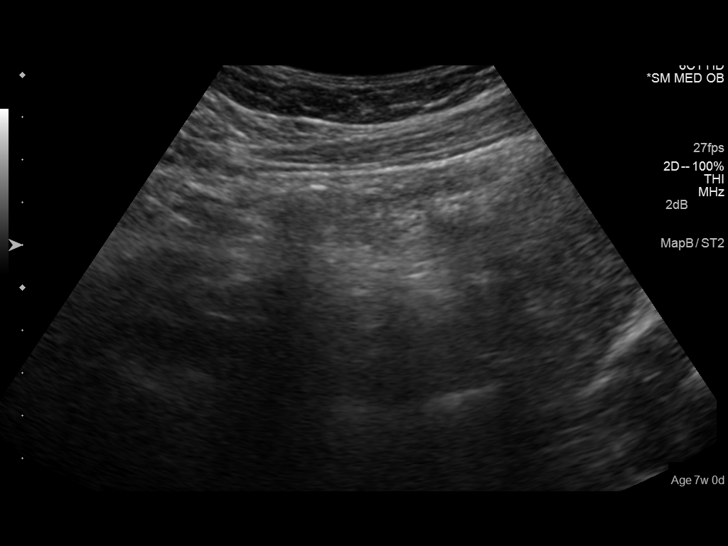
[im 6/46]
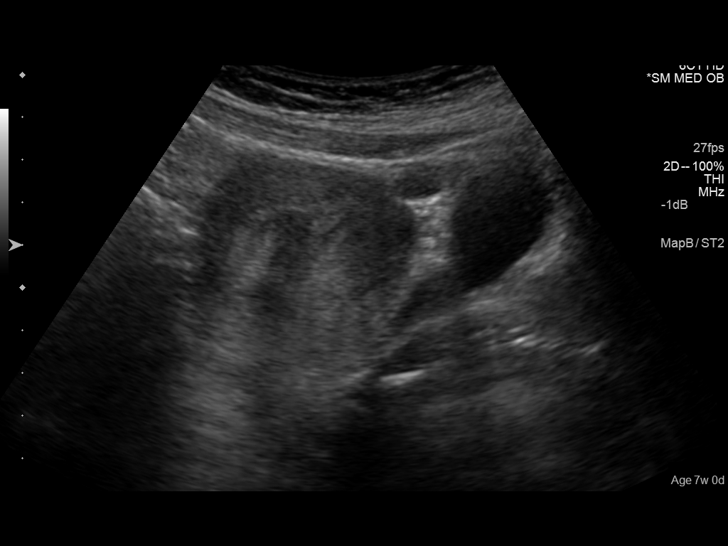
[im 9/46]
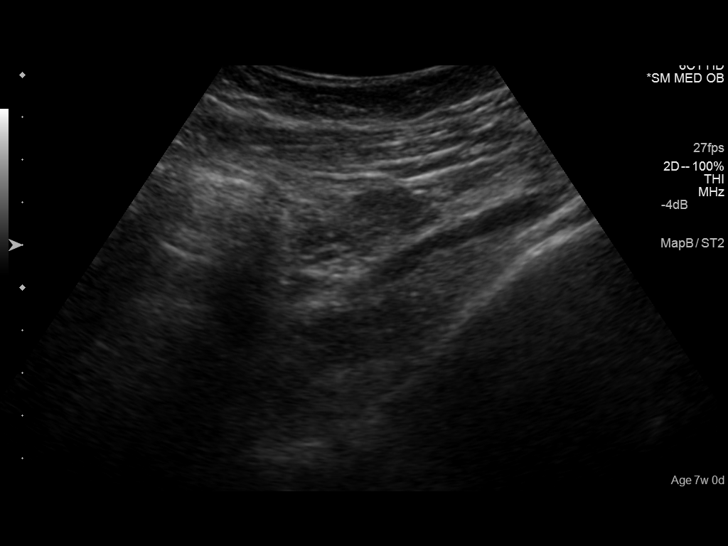
[im 12/46]
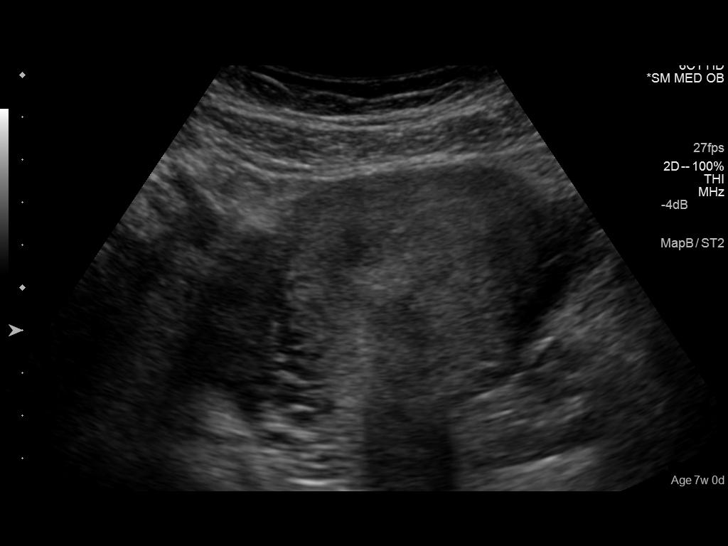
[im 16/46]
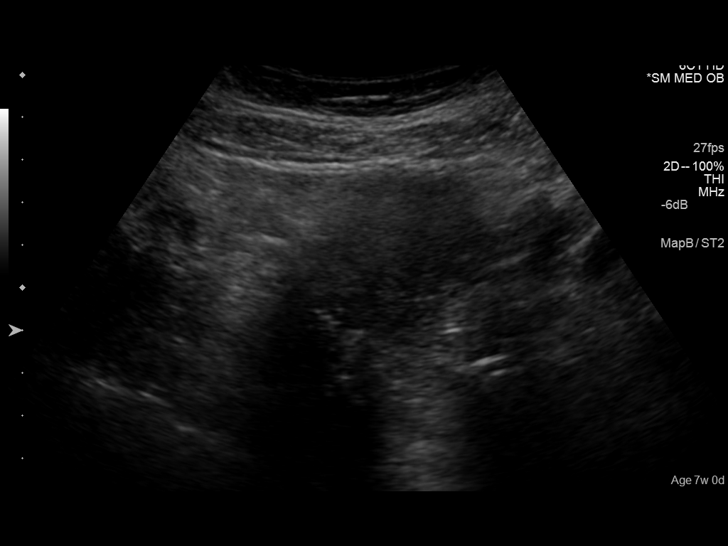
[im 19/46]
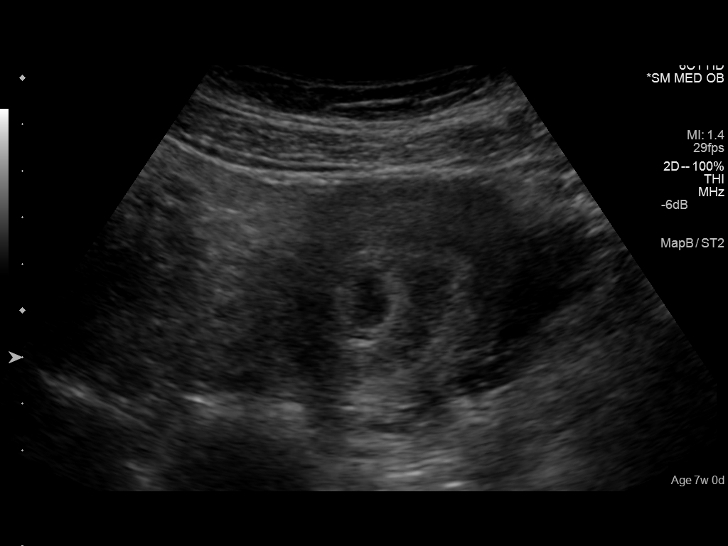
[im 22/46]
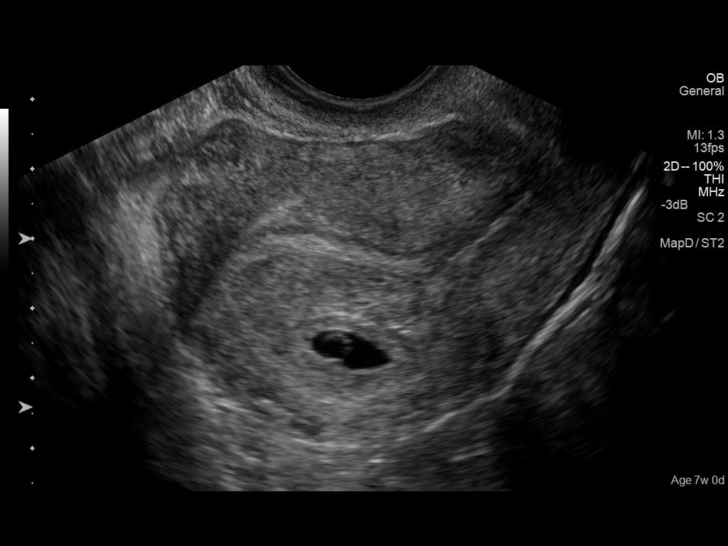
[im 26/46]
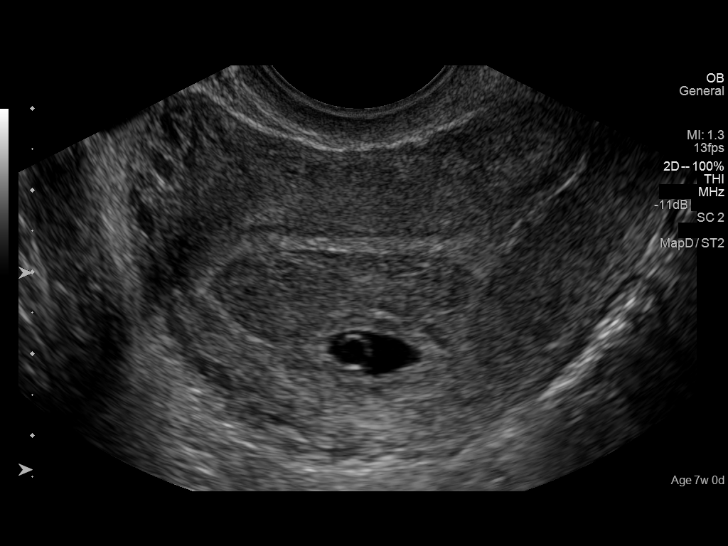
[im 29/46]
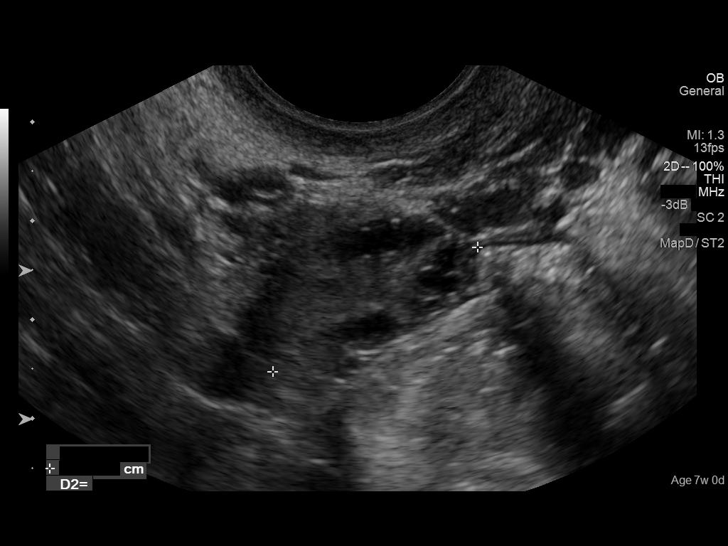
[im 32/46]
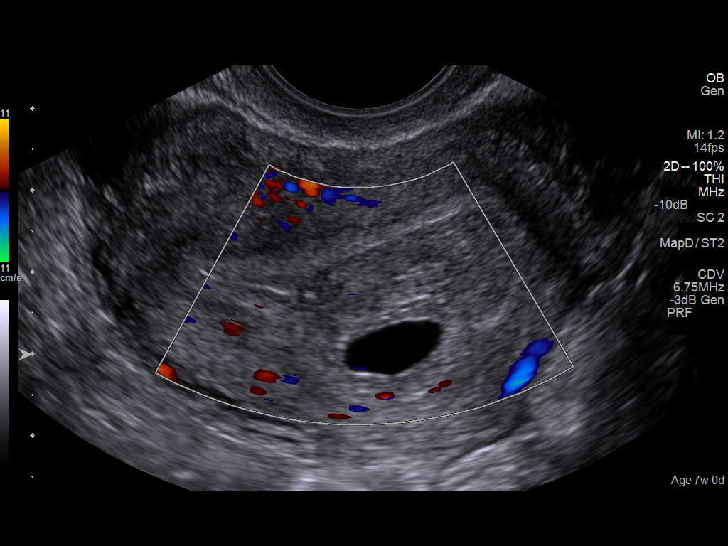
[im 36/46]
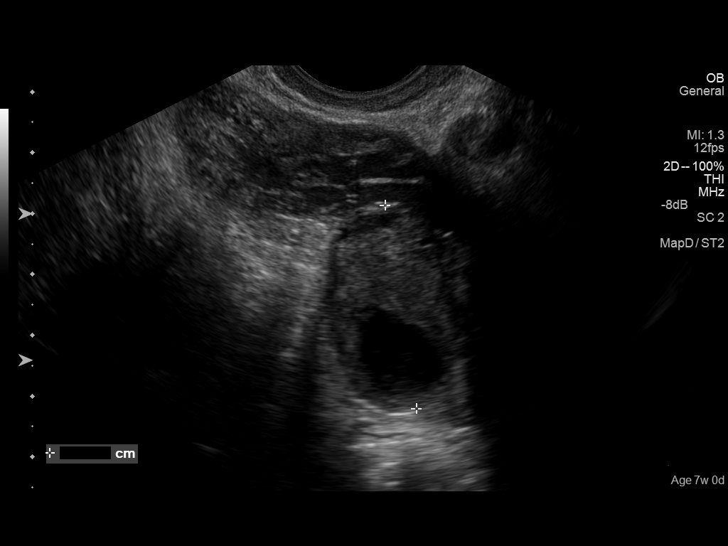
[im 39/46]
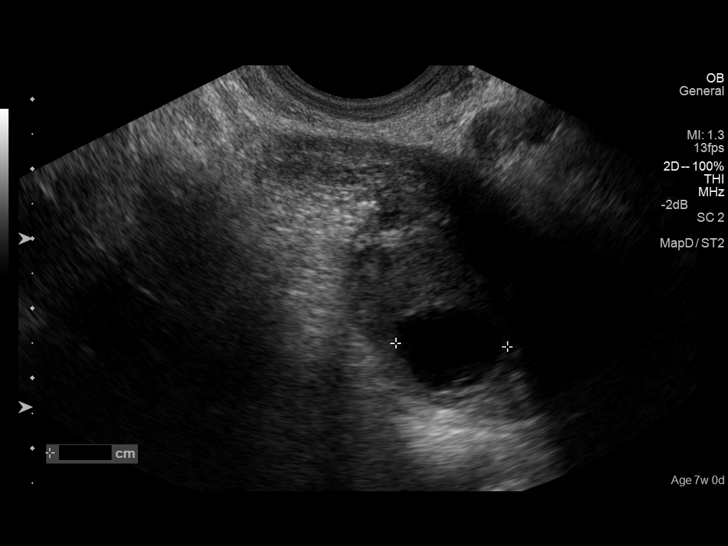
[im 42/46]
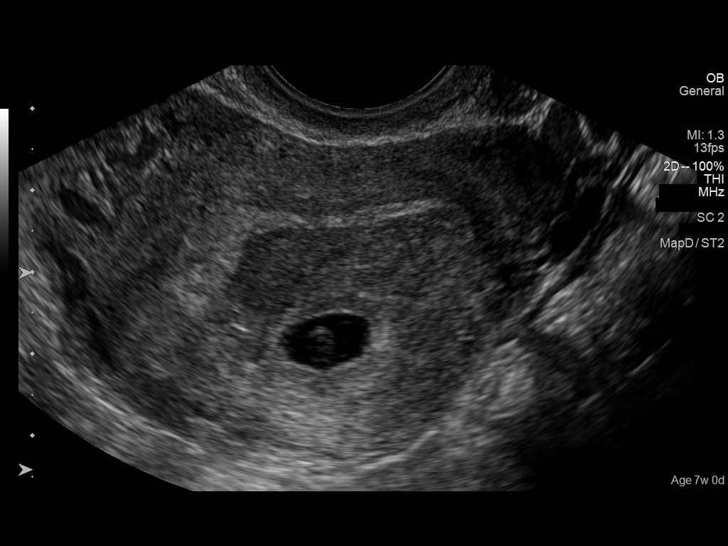
[im 46/46]
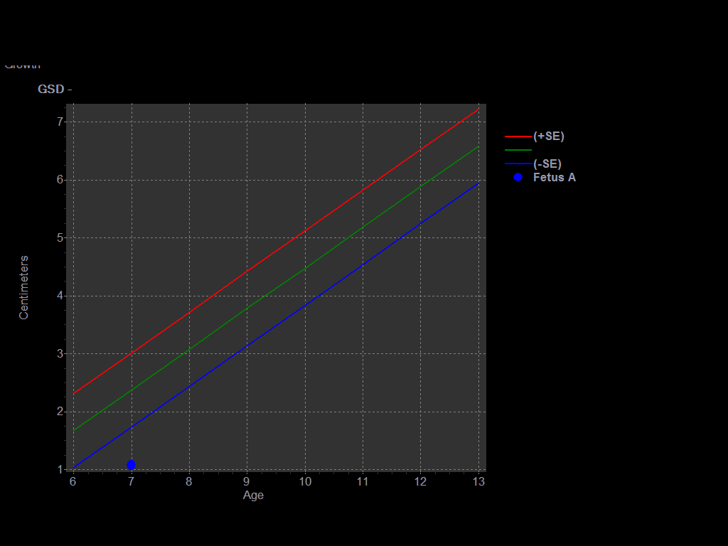

[14 of 28 positions shown; findings below may reference images not displayed]

FINDINGS: Intrauterine gestational sac: Present

Yolk sac:  Present

Embryo:  Not yet seen

MSD:  10.8  mm   5 w   6 d US EDC: [DATE]

Maternal uterus/adnexae: Symmetric and normal size ovaries when
accounting for a dominant follicle in the left ovary, 1.5 cm. No
adnexal mass seen. There is a moderate subchorionic hematoma,
measuring 1.4 x 2.7 x 3.4 cm. The hematoma is slightly hypoechoic to
the endometrium; it encompasses nearly half the gestational sac
circumference.
IMPRESSION: 1. Single intrauterine gestation, estimated age 5 weeks 6 days.
2. Moderate subchorionic hematoma, as above.

## 2014-03-03 DIAGNOSIS — I888 Other nonspecific lymphadenitis: Secondary | ICD-10-CM

## 2014-03-03 HISTORY — DX: Other nonspecific lymphadenitis: I88.8

## 2014-03-14 ENCOUNTER — Emergency Department: Payer: Self-pay | Admitting: Emergency Medicine

## 2014-03-14 LAB — BASIC METABOLIC PANEL
ANION GAP: 6 — AB (ref 7–16)
BUN: 9 mg/dL (ref 7–18)
CALCIUM: 8.3 mg/dL — AB (ref 8.5–10.1)
Chloride: 108 mmol/L — ABNORMAL HIGH (ref 98–107)
Co2: 26 mmol/L (ref 21–32)
Creatinine: 0.42 mg/dL — ABNORMAL LOW (ref 0.60–1.30)
EGFR (African American): >60
Glucose: 82 mg/dL (ref 65–99)
OSMOLALITY: 277 (ref 275–301)
POTASSIUM: 3.5 mmol/L (ref 3.5–5.1)
Sodium: 140 mmol/L (ref 136–145)

## 2014-03-14 LAB — URINALYSIS, COMPLETE
Bacteria: NONE SEEN
Bilirubin,UR: NEGATIVE
Blood: NEGATIVE
Glucose,UR: NEGATIVE mg/dL (ref 0–75)
Leukocyte Esterase: NEGATIVE
NITRITE: NEGATIVE
Ph: 6 (ref 4.5–8.0)
Protein: NEGATIVE
Specific Gravity: 1.023 (ref 1.003–1.030)
Squamous Epithelial: 5
Transitional Epi: <1
WBC UR: 2 /HPF (ref 0–5)

## 2014-03-14 LAB — CBC WITH DIFFERENTIAL/PLATELET
Basophil #: 0 10*3/uL (ref 0.0–0.1)
Basophil %: 0.1 %
EOS PCT: 1.2 %
Eosinophil #: 0.1 10*3/uL (ref 0.0–0.7)
HCT: 32.4 % — AB (ref 35.0–47.0)
HGB: 11.3 g/dL — AB (ref 12.0–16.0)
LYMPHS PCT: 13.6 %
Lymphocyte #: 1.4 10*3/uL (ref 1.0–3.6)
MCH: 34.5 pg — AB (ref 26.0–34.0)
MCHC: 34.8 g/dL (ref 32.0–36.0)
MCV: 99 fL (ref 80–100)
MONO ABS: 0.6 x10 3/mm (ref 0.2–0.9)
MONOS PCT: 6.1 %
NEUTROS PCT: 79 %
Neutrophil #: 8.3 10*3/uL — ABNORMAL HIGH (ref 1.4–6.5)
Platelet: 245 10*3/uL (ref 150–440)
RBC: 3.27 10*6/uL — ABNORMAL LOW (ref 3.80–5.20)
RDW: 14.4 % (ref 11.5–14.5)
WBC: 10.5 10*3/uL (ref 3.6–11.0)

## 2014-05-27 ENCOUNTER — Observation Stay: Payer: Self-pay | Admitting: Obstetrics and Gynecology

## 2014-05-27 LAB — URINALYSIS, COMPLETE
BILIRUBIN, UR: NEGATIVE
Bacteria: NONE SEEN
Blood: NEGATIVE
GLUCOSE, UR: NEGATIVE mg/dL (ref 0–75)
KETONE: NEGATIVE
LEUKOCYTE ESTERASE: NEGATIVE
Nitrite: NEGATIVE
Ph: 7 (ref 4.5–8.0)
Protein: NEGATIVE
RBC,UR: NONE SEEN /HPF (ref 0–5)
Specific Gravity: 1.016 (ref 1.003–1.030)
Squamous Epithelial: 4

## 2014-11-21 NOTE — L&D Delivery Note (Cosign Needed)
Delivery Note At  a viable and healthy female was delivered via  (Presentation:OA;  ).  APGAR:9 ,10 ; weight  .   Placenta status:small, delivered , .3 vessel  Cord:  with the following complications:none .  Cord pH: NA  Anesthesia:  epidural Episiotomy:  none Lacerations:  none Suture Repair: NA Est. Blood Loss (mL):  200  Mom to postpartum.  Baby to Couplet care / Skin to Skin.  Robin Pafford Burr 09/04/2015, 4:36 PM

## 2015-01-31 LAB — OB RESULTS CONSOLE RPR: RPR: NONREACTIVE

## 2015-01-31 LAB — OB RESULTS CONSOLE VARICELLA ZOSTER ANTIBODY, IGG: VARICELLA IGG: IMMUNE

## 2015-01-31 LAB — OB RESULTS CONSOLE ANTIBODY SCREEN: ANTIBODY SCREEN: NEGATIVE

## 2015-01-31 LAB — OB RESULTS CONSOLE ABO/RH: RH Type: POSITIVE

## 2015-01-31 LAB — OB RESULTS CONSOLE HGB/HCT, BLOOD
HEMATOCRIT: 36 %
HEMOGLOBIN: 12.5 g/dL

## 2015-01-31 LAB — OB RESULTS CONSOLE RUBELLA ANTIBODY, IGM: Rubella: IMMUNE

## 2015-01-31 LAB — OB RESULTS CONSOLE GC/CHLAMYDIA
CHLAMYDIA, DNA PROBE: NEGATIVE
GC PROBE AMP, GENITAL: NEGATIVE

## 2015-01-31 LAB — OB RESULTS CONSOLE HEPATITIS B SURFACE ANTIGEN: Hepatitis B Surface Ag: NEGATIVE

## 2015-01-31 LAB — OB RESULTS CONSOLE PLATELET COUNT: Platelets: 295 10*3/uL

## 2015-01-31 LAB — OB RESULTS CONSOLE HIV ANTIBODY (ROUTINE TESTING): HIV: NONREACTIVE

## 2015-03-15 NOTE — Op Note (Signed)
PATIENT NAME:  [name], [name] MR#:  [id number] DATE OF BIRTH:  1986/02/13  DATE OF PROCEDURE:  05/28/2012  PREOPERATIVE DIAGNOSIS: Right inguinal adenopathy.   POSTOPERATIVE DIAGNOSIS: Right inguinal adenopathy.   OPERATIVE PROCEDURE: Excision right inguinal node.   SURGEON: Donnalee CurryJeffrey Hatcher Froning, MD   ANESTHESIA: General by LMA under Dr. Rubye OaksPalmer, Marcaine 0.5% with 1: 200,000 units epinephrine, 15 mL local infiltration.   CLINICAL NOTE: This 29 year old woman has a one-year history of a mass in the right groin area. Ultrasound suggested adenopathy. She was brought to the operating room for planned excision.   OPERATIVE NOTE: With the patient under adequate general anesthesia, the area was prepped with ChloraPrep and draped. A 4 to 5-cm incision was made after localizing the area with ultrasound. The skin was incised sharply and hemostasis achieved with electrocautery and 3-0 Vicryl ties. A bilobed mass consisting of an enlarged but otherwise normal-appearing lymph node laterally attached to a 1.5-cm smoothly marginated cystic-like lesion medially was excised and sent fresh to pathology. The wound was closed in layers with running 3-0 Vicryl deep and running 4-0 Vicryl subcuticular suture for the skin. Marcaine was infiltrated for postoperative analgesia. Benzoin, Steri-Strips, Telfa, and Tegaderm dressing was applied.   The patient tolerated the procedure well and was taken to the recovery room in stable condition.    ____________________________ Earline MayotteJeffrey W. Letica Giaimo, MD jwb:bjt D: 05/28/2012 11:47:02 ET T: 05/28/2012 14:32:06 ET JOB#: [id number]  cc: Margaretann LovelessNeelam S. Khan, MD Karman Veney Brion AlimentW Maccoy Haubner MD ELECTRONICALLY SIGNED 05/28/2012 21:17
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
## 2015-04-28 ENCOUNTER — Other Ambulatory Visit: Payer: Self-pay | Admitting: Obstetrics and Gynecology

## 2015-04-28 ENCOUNTER — Telehealth: Payer: Self-pay | Admitting: Obstetrics and Gynecology

## 2015-04-28 ENCOUNTER — Encounter: Payer: Self-pay | Admitting: *Deleted

## 2015-04-28 ENCOUNTER — Observation Stay
Admission: EM | Admit: 2015-04-28 | Discharge: 2015-04-29 | Disposition: A | Discharge status: Home / Self Care | Payer: Medicaid Other | Attending: Obstetrics and Gynecology | Admitting: Obstetrics and Gynecology

## 2015-04-28 DIAGNOSIS — O212 Late vomiting of pregnancy: Secondary | ICD-10-CM | POA: Diagnosis not present

## 2015-04-28 DIAGNOSIS — R112 Nausea with vomiting, unspecified: Secondary | ICD-10-CM | POA: Diagnosis present

## 2015-04-28 DIAGNOSIS — Z3A21 21 weeks gestation of pregnancy: Secondary | ICD-10-CM | POA: Diagnosis present

## 2015-04-28 DIAGNOSIS — Z3490 Encounter for supervision of normal pregnancy, unspecified, unspecified trimester: Secondary | ICD-10-CM

## 2015-04-28 HISTORY — DX: Other specified health status: Z78.9

## 2015-04-28 LAB — URINALYSIS COMPLETE WITH MICROSCOPIC (ARMC ONLY)
BACTERIA UA: NONE SEEN
Bilirubin Urine: NEGATIVE
Glucose, UA: >500 mg/dL — AB
HGB URINE DIPSTICK: NEGATIVE
LEUKOCYTES UA: NEGATIVE
NITRITE: NEGATIVE
PROTEIN: NEGATIVE mg/dL
Specific Gravity, Urine: 1.011 (ref 1.005–1.030)
pH: 6 (ref 5.0–8.0)

## 2015-04-28 MED ORDER — ONDANSETRON HCL 4 MG/2ML IJ SOLN
INTRAMUSCULAR | Status: AC
  Administered 2015-04-28: 4 mg via INTRAVENOUS
  Filled 2015-04-28: qty 2

## 2015-04-28 MED ORDER — ONDANSETRON 4 MG PO TBDP: 4.0000 mg | ORAL_TABLET | Freq: Four times a day (QID) | ORAL | Status: DC | PRN

## 2015-04-28 MED ORDER — ONDANSETRON HCL 4 MG/2ML IJ SOLN
4.0000 mg | Freq: Four times a day (QID) | INTRAMUSCULAR | Status: DC | PRN
  Administered 2015-04-28 – 2015-04-29 (×2): 4 mg via INTRAVENOUS

## 2015-04-28 MED ORDER — DEXTROSE 5 % IN LACTATED RINGERS IV BOLUS
500.0000 mL | Freq: Once | INTRAVENOUS | Status: AC
  Administered 2015-04-28: 500 mL via INTRAVENOUS

## 2015-04-28 MED ORDER — DEXTROSE IN LACTATED RINGERS 5 % IV SOLN
INTRAVENOUS | Status: DC
  Administered 2015-04-28 – 2015-04-29 (×2): via INTRAVENOUS

## 2015-04-28 MED ORDER — PROMETHAZINE HCL 25 MG/ML IJ SOLN
25.0000 mg | Freq: Once | INTRAMUSCULAR | Status: AC
  Administered 2015-04-28: 25 mg via INTRAVENOUS

## 2015-04-28 MED ORDER — PROMETHAZINE HCL 25 MG/ML IJ SOLN
INTRAMUSCULAR | Status: AC
  Administered 2015-04-28: 25 mg via INTRAVENOUS
  Filled 2015-04-28: qty 1

## 2015-04-28 NOTE — Telephone Encounter (Signed)
Please let her know I sent in rx for zofran ODT, and to let us know if that doesn't work

## 2015-04-28 NOTE — Telephone Encounter (Signed)
PT IS SICK THROWING UP CAN'T KEEP ANYTHING DOWN , SHE WANTED TO KNOW IF SOMETHING CAN BE PHONED IN

## 2015-04-28 NOTE — Telephone Encounter (Signed)
rx was faxed to pts pharmacy

## 2015-04-28 NOTE — OB Triage Provider Note (Signed)
L&D OB Triage Note  [name] is a 29 y.o. 334P2010 female at 7736w3d, EDD Estimated Date of Delivery: 09/05/15 who presented to triage for complaints of nausea and vomiting x 16 hours with no relief from ODT Zofran.  She was evaluated by the nurses with no significant findings/findings significant for vomiting and dehydration. Vital signs stable. An NST was not  performed due to early gestational age but tracing has been reviewed by CNM. She was treated with D5LR IVF and phenergan & zofran.   NST INTERPRETATION: Indications: dehydration                   Impression: Gastritis at 21 weeks   Plan: Slowly reintroduce fluids and bland diet. D/C home when stable and to continue Zofran ODT as needed. Continue routine prenatal care. Follow up with OB/GYN as previously scheduled.     Jode Lippe Ines BloomerBurr, CNM

## 2015-04-29 DIAGNOSIS — O212 Late vomiting of pregnancy: Secondary | ICD-10-CM | POA: Diagnosis not present

## 2015-04-29 MED ORDER — ONDANSETRON HCL 4 MG/2ML IJ SOLN
INTRAMUSCULAR | Status: AC
  Administered 2015-04-29: 4 mg via INTRAVENOUS
  Filled 2015-04-29: qty 2

## 2015-04-29 NOTE — Discharge Summary (Signed)
Reviewed discharge instructions with patient including when to return or follow up with provider. Instructed to rest and hydrate and call with any concerns.

## 2015-04-30 ENCOUNTER — Telehealth: Payer: Self-pay | Admitting: Obstetrics and Gynecology

## 2015-04-30 ENCOUNTER — Encounter: Payer: Self-pay | Admitting: Obstetrics and Gynecology

## 2015-06-05 ENCOUNTER — Ambulatory Visit (INDEPENDENT_AMBULATORY_CARE_PROVIDER_SITE_OTHER): Payer: Medicaid Other | Admitting: Obstetrics and Gynecology

## 2015-06-05 ENCOUNTER — Encounter: Payer: Self-pay | Admitting: Obstetrics and Gynecology

## 2015-06-05 VITALS — BP 97/64 | HR 97 | Wt 129.5 lb

## 2015-06-05 DIAGNOSIS — Z3402 Encounter for supervision of normal first pregnancy, second trimester: Secondary | ICD-10-CM

## 2015-06-05 DIAGNOSIS — Z202 Contact with and (suspected) exposure to infections with a predominantly sexual mode of transmission: Secondary | ICD-10-CM

## 2015-06-05 LAB — POCT URINALYSIS DIPSTICK
Bilirubin, UA: NEGATIVE
Blood, UA: NEGATIVE
Glucose, UA: NEGATIVE
Ketones, UA: NEGATIVE
Nitrite, UA: NEGATIVE
PH UA: 8
Protein, UA: NEGATIVE
SPEC GRAV UA: 1.01
UROBILINOGEN UA: NEGATIVE
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
## 2015-06-17 ENCOUNTER — Other Ambulatory Visit: Payer: Medicaid Other

## 2015-06-17 ENCOUNTER — Encounter: Payer: Self-pay | Admitting: Obstetrics and Gynecology

## 2015-06-17 ENCOUNTER — Ambulatory Visit (INDEPENDENT_AMBULATORY_CARE_PROVIDER_SITE_OTHER): Payer: Medicaid Other | Admitting: Obstetrics and Gynecology

## 2015-06-17 VITALS — BP 84/59 | HR 115 | Wt 132.8 lb

## 2015-06-17 DIAGNOSIS — Z131 Encounter for screening for diabetes mellitus: Secondary | ICD-10-CM

## 2015-06-17 DIAGNOSIS — Z3493 Encounter for supervision of normal pregnancy, unspecified, third trimester: Secondary | ICD-10-CM

## 2015-06-17 LAB — POCT URINALYSIS DIPSTICK
Glucose, UA: NEGATIVE
Ketones, UA: NEGATIVE
Leukocytes, UA: NEGATIVE
Nitrite, UA: NEGATIVE
RBC UA: NEGATIVE
SPEC GRAV UA: 1.015
Urobilinogen, UA: 0.2
pH, UA: 5

## 2015-06-17 MED ORDER — TETANUS-DIPHTH-ACELL PERTUSSIS 5-2.5-18.5 LF-MCG/0.5 IM SUSP
0.5000 mL | Freq: Once | INTRAMUSCULAR | Status: AC
  Administered 2015-06-17: 0.5 mL via INTRAMUSCULAR

## 2015-06-17 NOTE — Progress Notes (Signed)
ROB & glucola- STI panel obtained, no current s/s at this time.

## 2015-06-17 NOTE — Progress Notes (Signed)
Pt is here blood consent signed, tdap given, pt would like STD check C/o headaches, SOB

## 2015-06-18 ENCOUNTER — Telehealth: Payer: Self-pay | Admitting: *Deleted

## 2015-06-18 LAB — RPR: RPR Ser Ql: NONREACTIVE

## 2015-06-18 LAB — GLUCOSE, 1 HOUR GESTATIONAL: GESTATIONAL DIABETES SCREEN: 83 mg/dL (ref 65–139)

## 2015-06-18 LAB — HEMOGLOBIN AND HEMATOCRIT, BLOOD
HEMOGLOBIN: 10.7 g/dL — AB (ref 11.1–15.9)
Hematocrit: 31.6 % — ABNORMAL LOW (ref 34.0–46.6)

## 2015-06-18 LAB — HIV ANTIBODY (ROUTINE TESTING W REFLEX): HIV SCREEN 4TH GENERATION: NONREACTIVE

## 2015-06-18 LAB — HEPATITIS C ANTIBODY

## 2015-06-18 NOTE — Telephone Encounter (Signed)
Notified pt of lab results, samples put up front for pt, she was very appreciative

## 2015-06-18 NOTE — Telephone Encounter (Signed)
-----   Message from Ulyses Amor, [place] sent at 06/18/2015  8:07 AM EDT ----- Please let her know she passed her glucola but is anemic, needs to start iron supplements, put fusion plus samlpes out for her

## 2015-06-21 LAB — NUSWAB VAGINITIS PLUS (VG+)
Atopobium vaginae: HIGH Score — AB
BVAB 2: HIGH Score — AB
MEGASPHAERA 1: HIGH {score} — AB

## 2015-06-24 ENCOUNTER — Other Ambulatory Visit: Payer: Self-pay | Admitting: Obstetrics and Gynecology

## 2015-06-24 ENCOUNTER — Telehealth: Payer: Self-pay | Admitting: *Deleted

## 2015-06-24 DIAGNOSIS — N760 Acute vaginitis: Secondary | ICD-10-CM

## 2015-06-24 DIAGNOSIS — B9689 Other specified bacterial agents as the cause of diseases classified elsewhere: Secondary | ICD-10-CM

## 2015-06-24 LAB — HSV 1 AND 2 IGM ABS, INDIRECT: HSV 2 IgM: <1:10 {titer}

## 2015-06-24 MED ORDER — METRONIDAZOLE 500 MG PO TABS: 500.0000 mg | ORAL_TABLET | Freq: Two times a day (BID) | ORAL | Status: DC

## 2015-06-24 NOTE — Telephone Encounter (Signed)
-----   Message from Ulyses Amor, [place] sent at 06/24/2015  9:21 AM EDT ----- Please let her know vaginal swab was negative for STDs but she does have BV- I sent in prescription to treat- to take 1 pill bid x 5 days.

## 2015-06-24 NOTE — Telephone Encounter (Signed)
Notified pt of lab results and rx was sent for pt to pick up

## 2015-06-26 ENCOUNTER — Telehealth: Payer: Self-pay | Admitting: *Deleted

## 2015-06-26 NOTE — Telephone Encounter (Signed)
Spoke with pt lab results given

## 2015-06-26 NOTE — Telephone Encounter (Signed)
-----   Message from Ulyses Amor, [place] sent at 06/25/2015  8:35 AM EDT ----- Please let her know her herpes screen is negative

## 2015-06-30 ENCOUNTER — Ambulatory Visit (INDEPENDENT_AMBULATORY_CARE_PROVIDER_SITE_OTHER): Payer: Medicaid Other | Admitting: Obstetrics and Gynecology

## 2015-06-30 ENCOUNTER — Encounter: Payer: Self-pay | Admitting: Obstetrics and Gynecology

## 2015-06-30 VITALS — BP 104/65 | HR 101 | Wt 135.4 lb

## 2015-06-30 DIAGNOSIS — Z3493 Encounter for supervision of normal pregnancy, unspecified, third trimester: Secondary | ICD-10-CM

## 2015-06-30 LAB — POCT URINALYSIS DIPSTICK
Blood, UA: NEGATIVE
GLUCOSE UA: NEGATIVE
KETONES UA: NEGATIVE
NITRITE UA: NEGATIVE
PROTEIN UA: NEGATIVE
SPEC GRAV UA: 1.01
UROBILINOGEN UA: 0.2
pH, UA: 7

## 2015-06-30 NOTE — Progress Notes (Signed)
ROB-denies any changes Pt forgot to pick up Metronidazole, will do today

## 2015-06-30 NOTE — Progress Notes (Signed)
ROB-doing well, considering Depo PP and breastfeeding, plans circumcision.

## 2015-08-01 ENCOUNTER — Observation Stay
Admission: EM | Admit: 2015-08-01 | Discharge: 2015-08-01 | Disposition: A | Discharge status: Home / Self Care | Payer: Medicaid Other | Attending: Obstetrics and Gynecology | Admitting: Obstetrics and Gynecology

## 2015-08-01 DIAGNOSIS — Z3A35 35 weeks gestation of pregnancy: Secondary | ICD-10-CM | POA: Diagnosis not present

## 2015-08-01 LAB — URINALYSIS COMPLETE WITH MICROSCOPIC (ARMC ONLY)
BILIRUBIN URINE: NEGATIVE
Bacteria, UA: NONE SEEN
Glucose, UA: NEGATIVE mg/dL
HGB URINE DIPSTICK: NEGATIVE
KETONES UR: NEGATIVE mg/dL
LEUKOCYTES UA: NEGATIVE
Nitrite: NEGATIVE
PROTEIN: NEGATIVE mg/dL
SPECIFIC GRAVITY, URINE: 1.02 (ref 1.005–1.030)
pH: 6 (ref 5.0–8.0)

## 2015-08-01 MED ORDER — TERBUTALINE SULFATE 1 MG/ML IJ SOLN
INTRAMUSCULAR | Status: AC
  Administered 2015-08-01: 0.25 mg via SUBCUTANEOUS
  Filled 2015-08-01: qty 1

## 2015-08-01 MED ORDER — TERBUTALINE SULFATE 1 MG/ML IJ SOLN
0.2500 mg | Freq: Once | INTRAMUSCULAR | Status: AC
Start: 2015-08-01 — End: 2015-08-01
  Administered 2015-08-01: 0.25 mg via SUBCUTANEOUS

## 2015-08-01 NOTE — OB Triage Note (Signed)
Contractions every 15 minutes since 1230 PM today. No leaking fluid or vaginal bleeding. Decreased fetal movement. Tried rest and apple juice, no improvement.

## 2015-08-03 NOTE — Progress Notes (Signed)
NST INTERPRETATION:  Indications:  1. Contractions 2.  35.2 week IUP  Mode: External Baseline Rate (A): 125 bpm Variability: Moderate Accelerations: 15 x 15 Decelerations: None     Contraction Frequency (min): occasional   Impression: 1.  reactive NST 2. No evidence of labor   Plan:  1.  Labor precautions. 2.  Keep her regular OB appointment as scheduled   Herold Harms, MD

## 2015-08-05 ENCOUNTER — Encounter: Payer: Self-pay | Admitting: Obstetrics and Gynecology

## 2015-08-05 ENCOUNTER — Ambulatory Visit (INDEPENDENT_AMBULATORY_CARE_PROVIDER_SITE_OTHER): Payer: Medicaid Other | Admitting: Obstetrics and Gynecology

## 2015-08-05 VITALS — BP 100/62 | HR 95 | Wt 145.1 lb

## 2015-08-05 DIAGNOSIS — Z3493 Encounter for supervision of normal pregnancy, unspecified, third trimester: Secondary | ICD-10-CM

## 2015-08-05 LAB — POCT URINALYSIS DIPSTICK
BILIRUBIN UA: NEGATIVE
Blood, UA: NEGATIVE
GLUCOSE UA: NEGATIVE
KETONES UA: NEGATIVE
LEUKOCYTES UA: NEGATIVE
Nitrite, UA: NEGATIVE
Spec Grav, UA: >=1.03
Urobilinogen, UA: 0.2
pH, UA: 5

## 2015-08-05 MED ORDER — INFLUENZA VAC SPLIT QUAD 0.5 ML IM SUSY
0.5000 mL | PREFILLED_SYRINGE | Freq: Once | INTRAMUSCULAR | Status: AC
  Administered 2015-08-05: 0.5 mL via INTRAMUSCULAR

## 2015-08-05 NOTE — Progress Notes (Signed)
ROB-feeling better since going to hospital- no more uterine irritabilty, PTL precautions discussed, Cultures next visit.

## 2015-08-05 NOTE — Patient Instructions (Signed)

## 2015-08-05 NOTE — Progress Notes (Signed)
ROB- went to ER 08/01/2015, she is c/o fatigue

## 2015-08-13 ENCOUNTER — Ambulatory Visit (INDEPENDENT_AMBULATORY_CARE_PROVIDER_SITE_OTHER): Payer: Medicaid Other | Admitting: Obstetrics and Gynecology

## 2015-08-13 ENCOUNTER — Encounter: Payer: Self-pay | Admitting: Obstetrics and Gynecology

## 2015-08-13 VITALS — BP 102/66 | HR 92 | Wt 148.0 lb

## 2015-08-13 DIAGNOSIS — Z3493 Encounter for supervision of normal pregnancy, unspecified, third trimester: Secondary | ICD-10-CM

## 2015-08-13 DIAGNOSIS — Z36 Encounter for antenatal screening of mother: Secondary | ICD-10-CM

## 2015-08-13 DIAGNOSIS — O3473 Maternal care for abnormality of vulva and perineum, third trimester: Secondary | ICD-10-CM

## 2015-08-13 DIAGNOSIS — Z113 Encounter for screening for infections with a predominantly sexual mode of transmission: Secondary | ICD-10-CM

## 2015-08-13 DIAGNOSIS — Z3685 Encounter for antenatal screening for Streptococcus B: Secondary | ICD-10-CM

## 2015-08-13 LAB — POCT URINALYSIS DIPSTICK
BILIRUBIN UA: NEGATIVE
Glucose, UA: NEGATIVE
Ketones, UA: NEGATIVE
Leukocytes, UA: NEGATIVE
NITRITE UA: NEGATIVE
PH UA: 7
Protein, UA: NEGATIVE
RBC UA: NEGATIVE
Spec Grav, UA: 1.01
Urobilinogen, UA: 0.2

## 2015-08-13 MED ORDER — LIDOCAINE HCL 2 % EX GEL: 1.0000 "application " | CUTANEOUS | Status: DC | PRN

## 2015-08-13 MED ORDER — CLINDAMYCIN HCL 300 MG PO CAPS: 300.0000 mg | ORAL_CAPSULE | Freq: Three times a day (TID) | ORAL | Status: DC

## 2015-08-13 MED ORDER — FUSION PLUS PO CAPS: 1.0000 | ORAL_CAPSULE | Freq: Every day | ORAL | Status: DC

## 2015-08-13 NOTE — Progress Notes (Signed)
ROB-cultures obtained today, ? Knot R labia- soreness and painful

## 2015-08-13 NOTE — Progress Notes (Signed)
ROB- doing well, right vulvar boil noted 2-3cm painful- not draining- rx for lidocaine gel and antibiotic sent in. Cultures obtained.

## 2015-08-15 LAB — STREP GP B NAA+RFLX: Strep Gp B NAA+Rflx: NEGATIVE

## 2015-08-16 LAB — GC/CHLAMYDIA PROBE AMP
CHLAMYDIA, DNA PROBE: NEGATIVE
Neisseria gonorrhoeae by PCR: NEGATIVE

## 2015-08-20 ENCOUNTER — Encounter: Payer: Self-pay | Admitting: Obstetrics and Gynecology

## 2015-08-20 ENCOUNTER — Ambulatory Visit (INDEPENDENT_AMBULATORY_CARE_PROVIDER_SITE_OTHER): Payer: Medicaid Other | Admitting: Obstetrics and Gynecology

## 2015-08-20 ENCOUNTER — Encounter: Payer: Medicaid Other | Admitting: Obstetrics and Gynecology

## 2015-08-20 VITALS — BP 102/62 | HR 99 | Wt 145.1 lb

## 2015-08-20 DIAGNOSIS — Z3493 Encounter for supervision of normal pregnancy, unspecified, third trimester: Secondary | ICD-10-CM

## 2015-08-20 LAB — POCT URINALYSIS DIPSTICK
BILIRUBIN UA: 1
Blood, UA: NEGATIVE
Glucose, UA: NEGATIVE
LEUKOCYTES UA: NEGATIVE
NITRITE UA: NEGATIVE
PH UA: 6
Spec Grav, UA: 1.025
Urobilinogen, UA: 0.2

## 2015-08-20 MED ORDER — DOCUSATE SODIUM 100 MG PO CAPS
100.0000 mg | ORAL_CAPSULE | Freq: Two times a day (BID) | ORAL | Status: DC | PRN
Start: 2015-08-20 — End: 2015-10-13

## 2015-08-20 NOTE — Progress Notes (Signed)
ROB: Patient doing well, denies complaints.  Notes right vulvar boil has decreased in size but still present after antibiotic use.  Denies tenderness or drainage.  Concerned as to effects on delivery.  Reassured patient that boil was healing appropriately, and would have no bearing on being able to have a vaginal delivery as long as it was closed nnot obstructing the vaginal canal. RTC in 1 week. Labor precautions discussed.

## 2015-08-20 NOTE — Progress Notes (Signed)
Completed atb- vulvar boil still present. Not painful. No draining.

## 2015-08-27 ENCOUNTER — Encounter: Payer: Self-pay | Admitting: Obstetrics and Gynecology

## 2015-08-27 ENCOUNTER — Ambulatory Visit (INDEPENDENT_AMBULATORY_CARE_PROVIDER_SITE_OTHER): Payer: Medicaid Other | Admitting: Obstetrics and Gynecology

## 2015-08-27 VITALS — BP 103/66 | HR 106 | Wt 148.4 lb

## 2015-08-27 DIAGNOSIS — Z3493 Encounter for supervision of normal pregnancy, unspecified, third trimester: Secondary | ICD-10-CM

## 2015-08-27 LAB — POCT URINALYSIS DIPSTICK
Bilirubin, UA: NEGATIVE
Blood, UA: NEGATIVE
Glucose, UA: NEGATIVE
KETONES UA: NEGATIVE
LEUKOCYTES UA: NEGATIVE
Nitrite, UA: NEGATIVE
PH UA: 6
PROTEIN UA: NEGATIVE
SPEC GRAV UA: 1.01
UROBILINOGEN UA: 0.2

## 2015-08-27 NOTE — Progress Notes (Signed)
ROB-nose is stopped up x 1 day Some contractions, lots of pressure

## 2015-08-27 NOTE — Progress Notes (Signed)
ROB- labor precautions reiterated, will consider IOL next week due to precipitous delivery last time.

## 2015-09-03 NOTE — Telephone Encounter (Signed)
ERROR

## 2015-09-04 ENCOUNTER — Inpatient Hospital Stay: Payer: Medicaid Other | Admitting: Anesthesiology

## 2015-09-04 ENCOUNTER — Encounter: Payer: Self-pay | Admitting: *Deleted

## 2015-09-04 ENCOUNTER — Inpatient Hospital Stay
Admission: EM | Admit: 2015-09-04 | Discharge: 2015-09-06 | DRG: 775 | Disposition: A | Discharge status: Home / Self Care | Payer: Medicaid Other | Attending: Obstetrics and Gynecology | Admitting: Obstetrics and Gynecology

## 2015-09-04 ENCOUNTER — Ambulatory Visit (INDEPENDENT_AMBULATORY_CARE_PROVIDER_SITE_OTHER): Payer: Medicaid Other | Admitting: Obstetrics and Gynecology

## 2015-09-04 ENCOUNTER — Encounter: Payer: Self-pay | Admitting: Obstetrics and Gynecology

## 2015-09-04 VITALS — BP 99/68 | HR 93 | Wt 146.5 lb

## 2015-09-04 DIAGNOSIS — Z3A39 39 weeks gestation of pregnancy: Secondary | ICD-10-CM

## 2015-09-04 DIAGNOSIS — O9902 Anemia complicating childbirth: Secondary | ICD-10-CM | POA: Diagnosis present

## 2015-09-04 DIAGNOSIS — Z79899 Other long term (current) drug therapy: Secondary | ICD-10-CM | POA: Diagnosis not present

## 2015-09-04 DIAGNOSIS — Z3493 Encounter for supervision of normal pregnancy, unspecified, third trimester: Secondary | ICD-10-CM

## 2015-09-04 DIAGNOSIS — Z91040 Latex allergy status: Secondary | ICD-10-CM

## 2015-09-04 DIAGNOSIS — Z833 Family history of diabetes mellitus: Secondary | ICD-10-CM

## 2015-09-04 DIAGNOSIS — Z3490 Encounter for supervision of normal pregnancy, unspecified, unspecified trimester: Secondary | ICD-10-CM

## 2015-09-04 DIAGNOSIS — Z8041 Family history of malignant neoplasm of ovary: Secondary | ICD-10-CM | POA: Diagnosis not present

## 2015-09-04 DIAGNOSIS — Z880 Allergy status to penicillin: Secondary | ICD-10-CM

## 2015-09-04 LAB — POCT URINALYSIS DIPSTICK
BILIRUBIN UA: NEGATIVE
GLUCOSE UA: NEGATIVE
Ketones, UA: NEGATIVE
Leukocytes, UA: NEGATIVE
NITRITE UA: NEGATIVE
Protein, UA: NEGATIVE
RBC UA: NEGATIVE
SPEC GRAV UA: 1.01
Urobilinogen, UA: 0.2
pH, UA: 7

## 2015-09-04 LAB — CBC
HEMATOCRIT: 33.4 % — AB (ref 35.0–47.0)
HEMOGLOBIN: 11.4 g/dL — AB (ref 12.0–16.0)
MCH: 30.7 pg (ref 26.0–34.0)
MCHC: 34 g/dL (ref 32.0–36.0)
MCV: 90.2 fL (ref 80.0–100.0)
PLATELETS: 239 10*3/uL (ref 150–440)
RBC: 3.7 MIL/uL — ABNORMAL LOW (ref 3.80–5.20)
RDW: 15.7 % — AB (ref 11.5–14.5)
WBC: 12.3 10*3/uL — ABNORMAL HIGH (ref 3.6–11.0)

## 2015-09-04 LAB — RAPID HIV SCREEN (HIV 1/2 AB+AG)
HIV 1/2 Antibodies: NONREACTIVE
HIV-1 P24 Antigen - HIV24: NONREACTIVE

## 2015-09-04 MED ORDER — LACTATED RINGERS IV SOLN
INTRAVENOUS | Status: DC
  Administered 2015-09-04: 12:00:00 via INTRAVENOUS

## 2015-09-04 MED ORDER — BUPIVACAINE HCL (PF) 0.25 % IJ SOLN
INTRAMUSCULAR | Status: DC | PRN
  Administered 2015-09-04: 10 mL via EPIDURAL

## 2015-09-04 MED ORDER — OXYTOCIN BOLUS FROM INFUSION
500.0000 mL | INTRAVENOUS | Status: DC
Start: 2015-09-04 — End: 2015-09-04

## 2015-09-04 MED ORDER — WITCH HAZEL-GLYCERIN EX PADS: 1.0000 "application " | MEDICATED_PAD | CUTANEOUS | Status: DC | PRN

## 2015-09-04 MED ORDER — ACETAMINOPHEN 325 MG PO TABS: 650.0000 mg | ORAL_TABLET | ORAL | Status: DC | PRN

## 2015-09-04 MED ORDER — IBUPROFEN 600 MG PO TABS
ORAL_TABLET | ORAL | Status: AC
  Administered 2015-09-04: 600 mg via ORAL
  Filled 2015-09-04: qty 1

## 2015-09-04 MED ORDER — IBUPROFEN 600 MG PO TABS
600.0000 mg | ORAL_TABLET | Freq: Four times a day (QID) | ORAL | Status: DC
  Administered 2015-09-04 – 2015-09-06 (×8): 600 mg via ORAL
  Filled 2015-09-04 (×7): qty 1

## 2015-09-04 MED ORDER — LANOLIN HYDROUS EX OINT: TOPICAL_OINTMENT | CUTANEOUS | Status: DC | PRN

## 2015-09-04 MED ORDER — DIPHENHYDRAMINE HCL 25 MG PO CAPS
25.0000 mg | ORAL_CAPSULE | Freq: Four times a day (QID) | ORAL | Status: DC | PRN
Start: 2015-09-04 — End: 2015-09-06

## 2015-09-04 MED ORDER — SIMETHICONE 80 MG PO CHEW: 80.0000 mg | CHEWABLE_TABLET | ORAL | Status: DC | PRN

## 2015-09-04 MED ORDER — LIDOCAINE HCL (PF) 2 % IJ SOLN
INTRAMUSCULAR | Status: DC | PRN
  Administered 2015-09-04: 7 mL via INTRADERMAL

## 2015-09-04 MED ORDER — OXYCODONE-ACETAMINOPHEN 5-325 MG PO TABS
2.0000 | ORAL_TABLET | ORAL | Status: DC | PRN
  Administered 2015-09-04 – 2015-09-05 (×5): 2 via ORAL
  Filled 2015-09-04 (×4): qty 2

## 2015-09-04 MED ORDER — DIPHENHYDRAMINE HCL 50 MG/ML IJ SOLN: 12.5000 mg | INTRAMUSCULAR | Status: DC | PRN

## 2015-09-04 MED ORDER — DIBUCAINE 1 % RE OINT: 1.0000 "application " | TOPICAL_OINTMENT | RECTAL | Status: DC | PRN

## 2015-09-04 MED ORDER — PRENATAL MULTIVITAMIN CH
1.0000 | ORAL_TABLET | Freq: Every day | ORAL | Status: DC
  Administered 2015-09-05 – 2015-09-06 (×2): 1 via ORAL
  Filled 2015-09-04 (×2): qty 1

## 2015-09-04 MED ORDER — FENTANYL 2.5 MCG/ML W/ROPIVACAINE 0.2% IN NS 100 ML EPIDURAL INFUSION (ARMC-ANES): 10.0000 mL/h | EPIDURAL | Status: DC

## 2015-09-04 MED ORDER — OXYCODONE-ACETAMINOPHEN 5-325 MG PO TABS
1.0000 | ORAL_TABLET | ORAL | Status: DC | PRN
  Administered 2015-09-06 (×2): 1 via ORAL
  Filled 2015-09-04 (×2): qty 1

## 2015-09-04 MED ORDER — FENTANYL CITRATE (PF) 100 MCG/2ML IJ SOLN: 50.0000 ug | INTRAMUSCULAR | Status: DC | PRN

## 2015-09-04 MED ORDER — LIDOCAINE HCL (PF) 1 % IJ SOLN: 30.0000 mL | INTRAMUSCULAR | Status: DC | PRN

## 2015-09-04 MED ORDER — CITRIC ACID-SODIUM CITRATE 334-500 MG/5ML PO SOLN: 30.0000 mL | ORAL | Status: DC | PRN

## 2015-09-04 MED ORDER — PHENYLEPHRINE 40 MCG/ML (10ML) SYRINGE FOR IV PUSH (FOR BLOOD PRESSURE SUPPORT): 80.0000 ug | PREFILLED_SYRINGE | INTRAVENOUS | Status: DC | PRN

## 2015-09-04 MED ORDER — FENTANYL 2.5 MCG/ML W/ROPIVACAINE 0.2% IN NS 100 ML EPIDURAL INFUSION (ARMC-ANES)
EPIDURAL | Status: AC
  Administered 2015-09-04: 10 mL/h via EPIDURAL
  Filled 2015-09-04: qty 100

## 2015-09-04 MED ORDER — BUTORPHANOL TARTRATE 1 MG/ML IJ SOLN
1.0000 mg | INTRAMUSCULAR | Status: DC | PRN
  Administered 2015-09-04 (×2): 1 mg via INTRAVENOUS
  Filled 2015-09-04 (×2): qty 1

## 2015-09-04 MED ORDER — EPHEDRINE 5 MG/ML INJ
10.0000 mg | INTRAVENOUS | Status: DC | PRN
Start: 2015-09-04 — End: 2015-09-04

## 2015-09-04 MED ORDER — OXYTOCIN 40 UNITS IN LACTATED RINGERS INFUSION - SIMPLE MED
1.0000 m[IU]/min | INTRAVENOUS | Status: DC
  Administered 2015-09-04: 1 m[IU]/min via INTRAVENOUS

## 2015-09-04 MED ORDER — BENZOCAINE-MENTHOL 20-0.5 % EX AERO: 1.0000 "application " | INHALATION_SPRAY | CUTANEOUS | Status: DC | PRN

## 2015-09-04 MED ORDER — LIDOCAINE HCL (PF) 1 % IJ SOLN: INTRAMUSCULAR | Status: DC | PRN

## 2015-09-04 MED ORDER — TERBUTALINE SULFATE 1 MG/ML IJ SOLN: 0.2500 mg | Freq: Once | INTRAMUSCULAR | Status: DC | PRN

## 2015-09-04 MED ORDER — OXYCODONE-ACETAMINOPHEN 5-325 MG PO TABS
ORAL_TABLET | ORAL | Status: AC
  Filled 2015-09-04: qty 2

## 2015-09-04 MED ORDER — LACTATED RINGERS IV SOLN: 500.0000 mL | INTRAVENOUS | Status: DC | PRN

## 2015-09-04 MED ORDER — ZOLPIDEM TARTRATE 5 MG PO TABS: 5.0000 mg | ORAL_TABLET | Freq: Every evening | ORAL | Status: DC | PRN

## 2015-09-04 MED ORDER — LIDOCAINE-EPINEPHRINE (PF) 1.5 %-1:200000 IJ SOLN
INTRAMUSCULAR | Status: DC | PRN
  Administered 2015-09-04: 3 mL via PERINEURAL

## 2015-09-04 MED ORDER — OXYTOCIN 40 UNITS IN LACTATED RINGERS INFUSION - SIMPLE MED
62.5000 mL/h | INTRAVENOUS | Status: DC
  Administered 2015-09-04: 999 mL/h via INTRAVENOUS
  Filled 2015-09-04: qty 1000

## 2015-09-04 MED ORDER — ONDANSETRON HCL 4 MG PO TABS: 4.0000 mg | ORAL_TABLET | ORAL | Status: DC | PRN

## 2015-09-04 MED ORDER — ONDANSETRON HCL 4 MG/2ML IJ SOLN
4.0000 mg | Freq: Four times a day (QID) | INTRAMUSCULAR | Status: DC | PRN
  Administered 2015-09-04: 4 mg via INTRAVENOUS
  Filled 2015-09-04: qty 2

## 2015-09-04 MED ORDER — SENNOSIDES-DOCUSATE SODIUM 8.6-50 MG PO TABS
2.0000 | ORAL_TABLET | ORAL | Status: DC
  Administered 2015-09-06: 2 via ORAL
  Filled 2015-09-04: qty 2

## 2015-09-04 MED ORDER — ONDANSETRON HCL 4 MG/2ML IJ SOLN: 4.0000 mg | INTRAMUSCULAR | Status: DC | PRN

## 2015-09-04 NOTE — H&P (Signed)
Obstetric History and Physical  [name] is a 29 y.o. [phone] with IUP at [redacted]w[redacted]d presenting with contraction. Patient states she has been having  irregular, every 6-9 minutes contractions, none vaginal bleeding, intact membranes, with active fetal movement.    Prenatal Course Source of Care: [facility]  Pregnancy complications or risks:none  Prenatal labs and studies: ABO, Rh: O/Positive/-- (03/12 0000) Antibody: Negative (03/12 0000) Rubella: Immune (03/12 0000) RPR: Non Reactive (07/27 1426)  HBsAg: Negative (03/12 0000)  HIV: Non-reactive (03/12 0000)  GBS:  1 hr Glucola  normal Genetic screening normal Anatomy US normal  Past Medical History  Diagnosis Date  . Medical history non-contributory   . Need for Tdap vaccination     at 28 weeks  . Anxiety and depression   . Headache, tension-type   . Bleeding nose   . 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 Harmsen today for follow up regarding left inguinal adenopathy with history of right caseating granulomatous lymphadenitis with resection in 2013. She saw Ermalene Searing in our department two weeks ago. Her case was discussed with Dr Noel Gerold with [facility] ID and  differential diagnosis includes non-infectious disease process (ex. Sarcoidosis, malignancy), infectious disease (ex. viral or bacterial, including TB), fungal infection (ex. Blastomycosis, coccidioidomycosis, histoplasmosis). She was scheduled to see surgical oncology for a possible biopsy, but missed that appointment. She has appt with Dr. Rosemarie Beath on 03/26/14.  We reviewed the results of her evaluation thus far:  03/03/14 CXR wnl 03/03/14 quantiferon TB test neg 03/03/14 CBC WBC 14 (wnl for pregnancy) 03/03/14 fungal antibodies negative  03/03/14 sed rate 28 and CRP 1.9 (mildly elevated although consistent with pregnancy)  She does report a dramatic improvement in the size of the left lymph node since completing a course    Past Surgical History   Procedure Laterality Date  . Lymphadenectomy Right     "size of a baseball"  . Laparoscopy  2010    OB History  Gravida Para Term Preterm AB SAB TAB Ectopic Multiple Living  # Outcome Date GA Lbr Len/2nd Weight Sex Delivery Anes PTL Lv  4 Current           3 Term 07/30/14    M Vag-Spont None  Y  2 Term 04/15/10    F Vag-Spont EPI  Y  1 SAB 02/23/07        FD      Social History   Social History  . Marital Status: Single    Spouse Name: N/A  . Number of Children: N/A  . Years of Education: N/A   Social History Main Topics  . Smoking status: Never Smoker   . Smokeless tobacco: Never Used  . Alcohol Use: No  . Drug Use: No  . Sexual Activity: No   Other Topics Concern  . None   Social History Narrative    Family History  Problem Relation Age of Onset  . Diabetes Maternal Grandfather   . Ovarian cancer Paternal Grandmother   . Diabetes Paternal Grandfather   . Breast cancer Neg Hx   . Colon cancer Neg Hx   . Heart disease Neg Hx   . Diabetes Father     Prescriptions prior to admission  Medication Sig Dispense Refill Last Dose  . acyclovir (ZOVIRAX) 200 MG capsule Take 200 mg by mouth 5 (five) times daily.     Marland Kitchen  loratadine (CLARITIN) 10 MG tablet Take 10 mg by mouth daily.   09/04/2015 at Unknown time  . Prenatal Vit-Fe Fumarate-FA (PRENATAL MULTIVITAMIN) TABS tablet Take 1 tablet by mouth daily at 12 noon.   09/04/2015 at Unknown time  . docusate sodium (COLACE) 100 MG capsule Take 1 capsule (100 mg total) by mouth 2 (two) times daily as needed for mild constipation. 60 capsule 2 Taking  . Iron-FA-B Cmp-C-Biot-Probiotic (FUSION PLUS) CAPS Take 1 capsule by mouth daily. 60 capsule 1 Taking    Allergies  Allergen Reactions  . Penicillins Hives  . Latex Rash    Review of Systems: Negative except for what is mentioned in HPI.  Physical Exam: BP 115/59 mmHg  Pulse 94  Temp(Src) 98 F (36.7 C) (Oral)  Resp 20  Ht 5\' 2"  (1.575 m)  Wt  66.225 kg (146 lb)  BMI 26.70 kg/m2  LMP 11/29/2014 GENERAL: Well-developed, well-nourished female in no acute distress.  LUNGS: Clear to auscultation bilaterally.  HEART: Regular rate and rhythm. ABDOMEN: Soft, nontender, nondistended, gravid. EXTREMITIES: Nontender, no edema, 2+ distal pulses. Cervical Exam:  5/80/-1, AROM clear fluid FHT:  Baseline rate 140 bpm   Variability moderate  Accelerations present   Decelerations none Contractions: Every 4 mins   Pertinent Labs/Studies:   Results for orders placed or performed during the hospital encounter of 09/04/15 (from the past 24 hour(s))  Rapid HIV screen (HIV 1/2 Ab+Ag) ([facility] Only)     Status: None   Collection Time: 09/04/15 11:35 AM  Result Value Ref Range   HIV-1 P24 Antigen - HIV24 NON REACTIVE NON REACTIVE   HIV 1/2 Antibodies NON REACTIVE NON REACTIVE   Interpretation (HIV Ag Ab)      A non reactive test result means that HIV 1 or HIV 2 antibodies and HIV 1 p24 antigen were not detected in the specimen.  CBC     Status: Abnormal   Collection Time: 09/04/15 11:35 AM  Result Value Ref Range   WBC 12.3 (H) 3.6 - 11.0 K/uL   RBC 3.70 (L) 3.80 - 5.20 MIL/uL   Hemoglobin 11.4 (L) 12.0 - 16.0 g/dL   HCT 63.833.4 (L) 75.635.0 - 43.347.0 %   MCV 90.2 80.0 - 100.0 fL   MCH 30.7 26.0 - 34.0 pg   MCHC 34.0 32.0 - 36.0 g/dL   RDW 29.515.7 (H) 18.811.5 - 41.614.5 %   Platelets 239 150 - 440 K/uL    Assessment : [name] is a 29 y.o. [id number] at 7864w6d being admitted for labor.  Plan: Labor: Expectant management.  Induction/Augmentation as needed, per protocol FWB: Reassuring fetal heart tracing.  GBS negative Delivery plan: Hopeful for vaginal delivery  Melody Ines BloomerBurr, CNM [facility], [facility]

## 2015-09-04 NOTE — Progress Notes (Signed)
Rob-sent to L&D for delivery

## 2015-09-04 NOTE — Anesthesia Preprocedure Evaluation (Signed)
Anesthesia Evaluation  Patient identified by MRN, date of birth, ID band Patient awake    Reviewed: Allergy & Precautions, NPO status , Patient's Chart, lab work & pertinent test results  Airway Mallampati: II  TM Distance: >3 FB Neck ROM: Full    Dental  (+) Chipped   Pulmonary neg pulmonary ROS,    Pulmonary exam normal breath sounds clear to auscultation       Cardiovascular negative cardio ROS Normal cardiovascular exam     Neuro/Psych  Headaches, PSYCHIATRIC DISORDERS Anxiety    GI/Hepatic negative GI ROS, Neg liver ROS,   Endo/Other  negative endocrine ROS  Renal/GU negative Renal ROS  negative genitourinary   Musculoskeletal   Abdominal Normal abdominal exam  (+)   Peds negative pediatric ROS (+)  Hematology negative hematology ROS (+)   Anesthesia Other Findings Hx of lymphadenitis  Reproductive/Obstetrics (+) Pregnancy                             Anesthesia Physical Anesthesia Plan  ASA: II  Anesthesia Plan: Epidural   Post-op Pain Management:    Induction:   Airway Management Planned: Natural Airway  Additional Equipment:   Intra-op Plan:   Post-operative Plan:   Informed Consent: I have reviewed the patients History and Physical, chart, labs and discussed the procedure including the risks, benefits and alternatives for the proposed anesthesia with the patient or authorized representative who has indicated his/her understanding and acceptance.   Dental advisory given  Plan Discussed with: Surgeon  Anesthesia Plan Comments: (Patient understands risks and aggrees to the procedure)        Anesthesia Quick Evaluation

## 2015-09-04 NOTE — Progress Notes (Signed)
ROB-pt is having contractions, pelvic pressure 

## 2015-09-04 NOTE — Anesthesia Procedure Notes (Signed)
Epidural Patient location during procedure: OB Start time: 09/04/2015 2:45 PM End time: 09/04/2015 3:00 PM  Staffing Anesthesiologist: Yves DillARROLL, Wrigley Plasencia Performed by: anesthesiologist   Preanesthetic Checklist Completed: patient identified, site marked, surgical consent, pre-op evaluation, timeout performed, IV checked, risks and benefits discussed and monitors and equipment checked  Epidural Patient position: sitting Prep: Betadine and site prepped and draped Patient monitoring: heart rate, cardiac monitor, continuous pulse ox and blood pressure Approach: midline Location: L3-L4 Injection technique: LOR air  Needle:  Needle type: Tuohy  Needle gauge: 18 G Needle length: 9 cm Needle insertion depth: 5 cm Test dose: negative and 1.5% lidocaine with Epi 1:200 K  Assessment Sensory level: T8  Additional Notes Patient placed in sitting position and time out called.  Prepped and draped in sterile fashion and epidural performed as above with a negative TD and good relief of pain after bolus. Patient tolerated the procedure well.Reason for block:procedure for pain

## 2015-09-05 LAB — RPR: RPR: NONREACTIVE

## 2015-09-05 LAB — CBC
HCT: 27.3 % — ABNORMAL LOW (ref 35.0–47.0)
Hemoglobin: 9.5 g/dL — ABNORMAL LOW (ref 12.0–16.0)
MCH: 31.5 pg (ref 26.0–34.0)
MCHC: 34.9 g/dL (ref 32.0–36.0)
MCV: 90.2 fL (ref 80.0–100.0)
PLATELETS: 186 10*3/uL (ref 150–440)
RBC: 3.03 MIL/uL — AB (ref 3.80–5.20)
RDW: 16 % — AB (ref 11.5–14.5)
WBC: 12 10*3/uL — AB (ref 3.6–11.0)

## 2015-09-05 MED ORDER — FE FUMARATE-B12-VIT C-FA-IFC PO CAPS
1.0000 | ORAL_CAPSULE | Freq: Three times a day (TID) | ORAL | Status: DC
  Administered 2015-09-05 – 2015-09-06 (×4): 1 via ORAL
  Filled 2015-09-05 (×7): qty 1

## 2015-09-05 NOTE — Progress Notes (Signed)
Post Partum Day 1 Subjective: up ad lib, voiding and strong afterbrith cramping and fatigue  Objective: Blood pressure 107/53, pulse 92, temperature 97.7 F (36.5 C), temperature source Oral, resp. rate 18, height 5\' 2"  (1.575 m), weight 146 lb (66.225 kg), last menstrual period 11/29/2014, SpO2 100 %, unknown if currently breastfeeding.  Physical Exam:  General: alert, cooperative, appears stated age and pale Lochia: appropriate Uterine Fundus: firm Incision: NA DVT Evaluation: No evidence of DVT seen on physical exam. Negative Homan's sign.   Recent Labs  09/04/15 1135 09/05/15 0437  HGB 11.4* 9.5*  HCT 33.4* 27.3*    Assessment/Plan: Plan for discharge tomorrow and Breastfeeding Infant feeding soley Breast;     LOS: 1 day   [name] 09/05/2015, 2:51 PM

## 2015-09-05 NOTE — Anesthesia Postprocedure Evaluation (Signed)
  Anesthesia Post-op Note  Patient: [name]  Procedure(s) Performed: * No procedures listed *  Anesthesia type:Epidural  Patient location: PACU  Post pain: Pain level controlled  Post assessment: Post-op Vital signs reviewed, Patient's Cardiovascular Status Stable, Respiratory Function Stable, Patent Airway and No signs of Nausea or vomiting  Post vital signs: Reviewed and stable  Last Vitals:  Filed Vitals:   09/05/15 0826  BP: 107/53  Pulse: 92  Temp: 36.5 C  Resp: 18    Level of consciousness: awake, alert  and patient cooperative  Complications: No apparent anesthesia complications

## 2015-09-06 MED ORDER — VITAMIN D 50 MCG (2000 UT) PO TABS: 2000.0000 [IU] | ORAL_TABLET | Freq: Every day | ORAL | Status: DC

## 2015-09-06 MED ORDER — MEDROXYPROGESTERONE ACETATE 150 MG/ML IM SUSP: 150.0000 mg | INTRAMUSCULAR | Status: DC

## 2015-09-06 MED ORDER — IBUPROFEN 600 MG PO TABS: 600.0000 mg | ORAL_TABLET | Freq: Four times a day (QID) | ORAL | Status: DC | PRN

## 2015-09-06 NOTE — Discharge Summary (Signed)
Obstetric Discharge Summary Reason for Admission: onset of labor Prenatal Procedures: ultrasound Intrapartum Procedures: spontaneous vaginal delivery Postpartum Procedures: none Complications-Operative and Postpartum: anemia HEMOGLOBIN  Date Value Ref Range Status  09/05/2015 9.5* 12.0 - 16.0 g/dL Final  29/56/213003/10/2015 86.512.5 g/dL Final   HGB  Date Value Ref Range Status  03/14/2014 11.3* 12.0-16.0 g/dL Final   HCT  Date Value Ref Range Status  09/05/2015 27.3* 35.0 - 47.0 % Final  01/31/2015 36 % Final  03/14/2014 32.4* 35.0-47.0 % Final   HEMATOCRIT  Date Value Ref Range Status  06/17/2015 31.6* 34.0 - 46.6 % Final    Physical Exam:  General: alert, cooperative, appears stated age, fatigued and pale Lochia: appropriate Uterine Fundus: firm Incision: NA DVT Evaluation: No evidence of DVT seen on physical exam. Negative Homan's sign.  Discharge Diagnoses: Term Pregnancy-delivered and anemia  Discharge Information: Date: 09/06/2015 Activity: pelvic rest Diet: routine Medications: PNV, Ibuprofen, Iron and DEPO to be given at 6 weeks, Vit D, Condition: stable Instructions: refer to practice specific booklet Discharge to: home   Newborn Data: Live born female  Birth Weight: 5 lb 14.9 oz (2690 g) APGAR: 9, 10  Home with mother.  [name] 09/06/2015, 10:29 AM

## 2015-09-06 NOTE — Progress Notes (Signed)
Discharge instructions reviewed with pt. PT v/u of all instructions. ID bands of mom and infant matched. Escorted by nursing via w/c in stable condition. Ruta HindsKelly Geraldina Parrott, RN 09/06/15 [phone]

## 2015-09-16 ENCOUNTER — Encounter: Payer: Medicaid Other | Admitting: Obstetrics and Gynecology

## 2015-10-09 ENCOUNTER — Ambulatory Visit: Payer: Medicaid Other | Admitting: Obstetrics and Gynecology

## 2015-10-13 ENCOUNTER — Ambulatory Visit (INDEPENDENT_AMBULATORY_CARE_PROVIDER_SITE_OTHER): Payer: Medicaid Other | Admitting: Obstetrics and Gynecology

## 2015-10-13 ENCOUNTER — Encounter: Payer: Self-pay | Admitting: Obstetrics and Gynecology

## 2015-10-13 VITALS — BP 102/63 | HR 89 | Wt 132.1 lb

## 2015-10-13 DIAGNOSIS — O9081 Anemia of the puerperium: Secondary | ICD-10-CM

## 2015-10-13 MED ORDER — ALPRAZOLAM 0.5 MG PO TABS: 0.5000 mg | ORAL_TABLET | Freq: Three times a day (TID) | ORAL | Status: DC | PRN

## 2015-10-13 MED ORDER — LAMOTRIGINE 25 MG PO TABS: 25.0000 mg | ORAL_TABLET | Freq: Every day | ORAL | Status: DC

## 2015-10-13 MED ORDER — NORGESTIMATE-ETH ESTRADIOL 0.25-35 MG-MCG PO TABS: 1.0000 | ORAL_TABLET | Freq: Every day | ORAL | Status: DC
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
## 2015-11-24 ENCOUNTER — Ambulatory Visit: Payer: Medicaid Other | Admitting: Obstetrics and Gynecology

## 2015-12-18 ENCOUNTER — Ambulatory Visit (INDEPENDENT_AMBULATORY_CARE_PROVIDER_SITE_OTHER): Payer: 59

## 2015-12-18 ENCOUNTER — Encounter: Payer: Self-pay | Admitting: Obstetrics and Gynecology

## 2015-12-18 ENCOUNTER — Ambulatory Visit (INDEPENDENT_AMBULATORY_CARE_PROVIDER_SITE_OTHER): Payer: 59 | Admitting: Obstetrics and Gynecology

## 2015-12-18 VITALS — BP 104/73 | HR 100 | Ht 63.0 in | Wt 126.0 lb

## 2015-12-18 DIAGNOSIS — N83209 Unspecified ovarian cyst, unspecified side: Secondary | ICD-10-CM | POA: Insufficient documentation

## 2015-12-18 DIAGNOSIS — R238 Other skin changes: Secondary | ICD-10-CM | POA: Diagnosis not present

## 2015-12-18 DIAGNOSIS — Q644 Malformation of urachus: Secondary | ICD-10-CM

## 2015-12-18 DIAGNOSIS — N83201 Unspecified ovarian cyst, right side: Secondary | ICD-10-CM

## 2015-12-18 DIAGNOSIS — L989 Disorder of the skin and subcutaneous tissue, unspecified: Secondary | ICD-10-CM

## 2015-12-18 HISTORY — DX: Unspecified ovarian cyst, unspecified side: N83.209

## 2015-12-18 HISTORY — DX: Malformation of urachus: Q64.4

## 2015-12-18 MED ORDER — LAMOTRIGINE 25 MG PO TABS: 50.0000 mg | ORAL_TABLET | Freq: Every day | ORAL | Status: DC
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
## 2015-12-23 ENCOUNTER — Emergency Department
Admission: EM | Admit: 2015-12-23 | Discharge: 2015-12-23 | Disposition: A | Discharge status: Home / Self Care | Payer: 59 | Attending: Emergency Medicine | Admitting: Emergency Medicine

## 2015-12-23 ENCOUNTER — Encounter: Payer: Self-pay | Admitting: *Deleted

## 2015-12-23 DIAGNOSIS — Z3202 Encounter for pregnancy test, result negative: Secondary | ICD-10-CM | POA: Diagnosis not present

## 2015-12-23 DIAGNOSIS — Z880 Allergy status to penicillin: Secondary | ICD-10-CM | POA: Insufficient documentation

## 2015-12-23 DIAGNOSIS — R0602 Shortness of breath: Secondary | ICD-10-CM | POA: Insufficient documentation

## 2015-12-23 DIAGNOSIS — Z79899 Other long term (current) drug therapy: Secondary | ICD-10-CM | POA: Insufficient documentation

## 2015-12-23 DIAGNOSIS — Z91040 Latex allergy status: Secondary | ICD-10-CM | POA: Insufficient documentation

## 2015-12-23 DIAGNOSIS — R202 Paresthesia of skin: Secondary | ICD-10-CM | POA: Diagnosis not present

## 2015-12-23 DIAGNOSIS — R000 Tachycardia, unspecified: Secondary | ICD-10-CM | POA: Diagnosis not present

## 2015-12-23 DIAGNOSIS — R42 Dizziness and giddiness: Secondary | ICD-10-CM | POA: Diagnosis not present

## 2015-12-23 LAB — BASIC METABOLIC PANEL
Anion gap: 9 (ref 5–15)
BUN: 11 mg/dL (ref 6–20)
CHLORIDE: 103 mmol/L (ref 101–111)
CO2: 26 mmol/L (ref 22–32)
Calcium: 9.6 mg/dL (ref 8.9–10.3)
Creatinine, Ser: 0.58 mg/dL (ref 0.44–1.00)
GFR calc Af Amer: >60 mL/min (ref >60)
GFR calc non Af Amer: >60 mL/min (ref >60)
GLUCOSE: 103 mg/dL — AB (ref 65–99)
POTASSIUM: 3.5 mmol/L (ref 3.5–5.1)
Sodium: 138 mmol/L (ref 135–145)

## 2015-12-23 LAB — CBC
HEMATOCRIT: 38.2 % (ref 35.0–47.0)
HEMOGLOBIN: 13.2 g/dL (ref 12.0–16.0)
MCH: 30 pg (ref 26.0–34.0)
MCHC: 34.7 g/dL (ref 32.0–36.0)
MCV: 86.6 fL (ref 80.0–100.0)
Platelets: 296 10*3/uL (ref 150–440)
RBC: 4.41 MIL/uL (ref 3.80–5.20)
RDW: 13.5 % (ref 11.5–14.5)
WBC: 8.3 10*3/uL (ref 3.6–11.0)

## 2015-12-23 LAB — URINALYSIS COMPLETE WITH MICROSCOPIC (ARMC ONLY)
BILIRUBIN URINE: NEGATIVE
Glucose, UA: NEGATIVE mg/dL
HGB URINE DIPSTICK: NEGATIVE
Ketones, ur: NEGATIVE mg/dL
LEUKOCYTES UA: NEGATIVE
Nitrite: NEGATIVE
PH: 6 (ref 5.0–8.0)
PROTEIN: NEGATIVE mg/dL
Specific Gravity, Urine: 1.014 (ref 1.005–1.030)

## 2015-12-23 LAB — GLUCOSE, CAPILLARY: Glucose-Capillary: 81 mg/dL (ref 65–99)

## 2015-12-23 LAB — POCT PREGNANCY, URINE: Preg Test, Ur: NEGATIVE

## 2015-12-23 LAB — FIBRIN DERIVATIVES D-DIMER (ARMC ONLY): Fibrin derivatives D-dimer (ARMC): 127 (ref 0–499)

## 2015-12-23 MED ORDER — ACETAMINOPHEN 325 MG PO TABS
650.0000 mg | ORAL_TABLET | Freq: Once | ORAL | Status: AC
  Administered 2015-12-23: 650 mg via ORAL
  Filled 2015-12-23: qty 2

## 2015-12-23 MED ORDER — SODIUM CHLORIDE 0.9 % IV BOLUS (SEPSIS)
1000.0000 mL | Freq: Once | INTRAVENOUS | Status: AC
  Administered 2015-12-23: 1000 mL via INTRAVENOUS

## 2015-12-23 NOTE — ED Notes (Signed)
Unsuccessful attempts to start IV at right hand and wrist. Teresita Madura, RN to attempt IV

## 2015-12-23 NOTE — ED Notes (Signed)
Pt complains of dizziness and tingling in head for the last month, pt reports "arms turned blue and were tingling today", bilateral arms are pale and mottled, positive pulses in both arms, pt states" I just don't feel right"

## 2015-12-23 NOTE — ED Provider Notes (Signed)
[facility] Emergency Department Provider Note    ____________________________________________  Time seen: ~2045  I have reviewed the triage vital signs and the nursing notes.   HISTORY  Chief Complaint Dizziness   History limited by: Not Limited   HPI [name] is a 29 y.o. female patient presents to the emergency department today because of concerns for dizziness and tingling. The patient states that she has felt dizzy for the past month. She states that these symptoms will come and go. She has not noticed any pattern to it. She states that she does get sometimes the sensation of the room moving back and forth. Patient states however it is not associated with standing or sitting or changing positions. It is not associated with exertion. Patient does state that she feels somewhat short of breath when she takes deep breaths. She denies any cough. Denies any fevers. Denies similar symptoms in the past.    Past Medical History  Diagnosis Date  . Medical history non-contributory   . Need for Tdap vaccination     at 28 weeks  . Anxiety and depression   . Headache, tension-type   . Bleeding nose   . 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 Talamante today for follow up regarding left inguinal adenopathy with history of right caseating granulomatous lymphadenitis with resection in 2013. She saw Ermalene Searing in our department two weeks ago. Her case was discussed with Dr Noel Gerold with [facility] ID and  differential diagnosis includes non-infectious disease process (ex. Sarcoidosis, malignancy), infectious disease (ex. viral or bacterial, including TB), fungal infection (ex. Blastomycosis, coccidioidomycosis, histoplasmosis). She was scheduled to see surgical oncology for a possible biopsy, but missed that appointment. She has appt with Dr. Rosemarie Beath on 03/26/14.  We reviewed the results of her evaluation thus far:  03/03/14 CXR wnl 03/03/14  quantiferon TB test neg 03/03/14 CBC WBC 14 (wnl for pregnancy) 03/03/14 fungal antibodies negative  03/03/14 sed rate 28 and CRP 1.9 (mildly elevated although consistent with pregnancy)  She does report a dramatic improvement in the size of the left lymph node since completing a course    Patient Active Problem List   Diagnosis Date Noted  . Ovarian cyst 12/18/2015  . 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 Vaginal delivery 09/04/2015  . Labor and delivery, indication for care 08/01/2015  . Pregnancy 04/28/2015    Past Surgical History  Procedure Laterality Date  . Lymphadenectomy Right     "size of a baseball"  . Laparoscopy  2010    Current Outpatient Rx  Name  Route  Sig  Dispense  Refill  . ALPRAZolam (XANAX) 0.5 MG tablet   Oral   Take 1 tablet (0.5 mg total) by mouth 3 (three) times daily as needed for anxiety.   50 tablet   1   . ibuprofen (ADVIL,MOTRIN) 600 MG tablet   Oral   Take 1 tablet (600 mg total) by mouth every 6 (six) hours as needed.   50 tablet   1   . Iron-FA-B Cmp-C-Biot-Probiotic (FUSION PLUS) CAPS   Oral   Take 1 capsule by mouth daily. Patient not taking: Reported on 12/18/2015   60 capsule   1   . lamoTRIgine (LAMICTAL) 25 MG tablet   Oral   Take 2 tablets (50 mg total) by mouth daily.   60 tablet   3   . loratadine (CLARITIN) 10 MG tablet   Oral   Take 10  mg by mouth daily.         . Prenatal Vit-Fe Fumarate-FA (PRENATAL MULTIVITAMIN) TABS tablet   Oral   Take 1 tablet by mouth daily at 12 noon. Reported on 12/18/2015           Allergies Penicillins and Latex  Family History  Problem Relation Age of Onset  . Diabetes Maternal Grandfather   . Ovarian cancer Paternal Grandmother   . Diabetes Paternal Grandfather   . Breast cancer Neg Hx   . Colon cancer Neg Hx   . Heart disease Neg Hx   . Diabetes Father     Social History Social History  Substance Use Topics  . Smoking status: Never Smoker   . Smokeless tobacco: Never Used  .  Alcohol Use: No    Review of Systems  Constitutional: Negative for fever. Cardiovascular: Negative for chest pain. Respiratory: Positive for shortness of breath. Gastrointestinal: Negative for abdominal pain, vomiting and diarrhea. Neurological: Negative for headaches, focal weakness or numbness.   10-point ROS otherwise negative.  ____________________________________________   PHYSICAL EXAM:  VITAL SIGNS: ED Triage Vitals  Enc Vitals Group     BP 12/23/15 1702 130/79 mmHg     Pulse Rate 12/23/15 1702 103     Resp 12/23/15 1702 22     Temp 12/23/15 1702 97.7 F (36.5 C)     Temp Source 12/23/15 1702 Oral     SpO2 12/23/15 1702 100 %     Weight 12/23/15 1702 126 lb (57.153 kg)     Height 12/23/15 1702  (1.575 m)     Head Cir --      Peak Flow --      Pain Score 12/23/15 1921 5   Constitutional: Alert and oriented. Well appearing and in no distress. Eyes: Conjunctivae are normal. PERRL. Normal extraocular movements. ENT   Head: Normocephalic and atraumatic.   Nose: No congestion/rhinnorhea.   Mouth/Throat: Mucous membranes are moist.   Neck: No stridor. Hematological/Lymphatic/Immunilogical: No cervical lymphadenopathy. Cardiovascular: Tachycardic, regular rhythm.  No murmurs, rubs, or gallops. Respiratory: Normal respiratory effort without tachypnea nor retractions. Breath sounds are clear and equal bilaterally. No wheezes/rales/rhonchi. Gastrointestinal: Soft and nontender. No distention.  Genitourinary: Deferred Musculoskeletal: Normal range of motion in all extremities. No joint effusions.  No lower extremity tenderness nor edema. Neurologic:  Normal speech and language. No gross focal neurologic deficits are appreciated.  Skin:  Skin is warm, dry and intact. No rash noted. Psychiatric: Mood and affect are normal. Speech and behavior are normal. Patient exhibits appropriate insight and judgment.  ____________________________________________     LABS (pertinent positives/negatives)  Labs Reviewed  BASIC METABOLIC PANEL - Abnormal; Notable for the following:    Glucose, Bld 103 (*)    All other components within normal limits  URINALYSIS COMPLETEWITH MICROSCOPIC ([facility] ONLY) - Abnormal; Notable for the following:    Color, Urine YELLOW (*)    APPearance CLEAR (*)    Bacteria, UA RARE (*)    Squamous Epithelial / LPF 0-5 (*)    All other components within normal limits  CBC  GLUCOSE, CAPILLARY  FIBRIN DERIVATIVES D-DIMER ([facility] ONLY)  CBG MONITORING, ED  POC URINE PREG, ED  POCT PREGNANCY, URINE     ____________________________________________   EKG  I, Phineas Semen, attending physician, personally viewed and interpreted this EKG  EKG Time: 1706 Rate: 103 Rhythm: sinus tachycardia Axis: normal Intervals: qtc 458 QRS: narrow ST changes: no st elevation Impression: sinus tachycardia, otherwise normal ekg  ____________________________________________    RADIOLOGY  None   ____________________________________________   PROCEDURES  Procedure(s) performed: None  Critical Care performed: No  ____________________________________________   INITIAL IMPRESSION / ASSESSMENT AND PLAN / ED COURSE  Pertinent labs & imaging results that were available during my care of the patient were reviewed by me and considered in my medical decision making (see chart for details).  Patient presented to the emergency department today because of concerns for some dizziness. Patient was mildly tachycardic. Patient also stated she had had some shortness of breath. Patient was recently pregnant so a d-dimer was sent however this was within normal limits. Patient's heart rate did improve after fluid bolus. I discussed with patient importance of following up with primary care doctor.  ____________________________________________   FINAL CLINICAL IMPRESSION(S) / ED DIAGNOSES  Final diagnoses:  Dizziness     Phineas Semen, MD 12/23/15 2344

## 2015-12-23 NOTE — ED Notes (Signed)
Discussed pt's HR with pt. Pt agreed to stay to see MD

## 2015-12-23 NOTE — ED Notes (Signed)
Pt reports she was not seen by MD. Pt reports she has children at home, and has no one to watch them. Pt was advised to stay to wait to speak with MD. Pt refused. Pt reports she will follow up with PCP.

## 2015-12-23 NOTE — Discharge Instructions (Signed)
As we discussed please be sure to contact your primary care doctor to discuss your symptoms and fast heart rate. Please seek medical attention for any high fevers, chest pain, shortness of breath, change in behavior, persistent vomiting, bloody stool or any other new or concerning symptoms.  Dizziness Dizziness is a common problem. It is a feeling of unsteadiness or light-headedness. You may feel like you are about to faint. Dizziness can lead to injury if you stumble or fall. Anyone can become dizzy, but dizziness is more common in older adults. This condition can be caused by a number of things, including medicines, dehydration, or illness. HOME CARE INSTRUCTIONS Taking these steps may help with your condition: Eating and Drinking  Drink enough fluid to keep your urine clear or pale yellow. This helps to keep you from becoming dehydrated. Try to drink more clear fluids, such as water.  Do not drink alcohol.  Limit your caffeine intake if directed by your health care provider.  Limit your salt intake if directed by your health care provider. Activity  Avoid making quick movements.  Rise slowly from chairs and steady yourself until you feel okay.  In the morning, first sit up on the side of the bed. When you feel okay, stand slowly while you hold onto something until you know that your balance is fine.  Move your legs often if you need to stand in one place for a long time. Tighten and relax your muscles in your legs while you are standing.  Do not drive or operate heavy machinery if you feel dizzy.  Avoid bending down if you feel dizzy. Place items in your home so that they are easy for you to reach without leaning over. Lifestyle  Do not use any tobacco products, including cigarettes, chewing tobacco, or electronic cigarettes. If you need help quitting, ask your health care provider.  Try to reduce your stress level, such as with yoga or meditation. Talk with your health care  provider if you need help. General Instructions  Watch your dizziness for any changes.  Take medicines only as directed by your health care provider. Talk with your health care provider if you think that your dizziness is caused by a medicine that you are taking.  Tell a friend or a family member that you are feeling dizzy. If he or she notices any changes in your behavior, have this person call your health care provider.  Keep all follow-up visits as directed by your health care provider. This is important. SEEK MEDICAL CARE IF:  Your dizziness does not go away.  Your dizziness or light-headedness gets worse.  You feel nauseous.  You have reduced hearing.  You have new symptoms.  You are unsteady on your feet or you feel like the room is spinning. SEEK IMMEDIATE MEDICAL CARE IF:  You vomit or have diarrhea and are unable to eat or drink anything.  You have problems talking, walking, swallowing, or using your arms, hands, or legs.  You feel generally weak.  You are not thinking clearly or you have trouble forming sentences. It may take a friend or family member to notice this.  You have chest pain, abdominal pain, shortness of breath, or sweating.  Your vision changes.  You notice any bleeding.  You have a headache.  You have neck pain or a stiff neck.  You have a fever.   This information is not intended to replace advice given to you by your health care provider. Make sure  you discuss any questions you have with your health care provider.   Document Released: 05/03/2001 Document Revised: 03/24/2015 Document Reviewed: 11/03/2014 Elsevier Interactive Patient Education [employer]

## 2015-12-23 NOTE — ED Notes (Signed)
Pt reports her dizziness is relieved

## 2015-12-23 NOTE — ED Notes (Signed)
Reviewed d/c instructions, and follow-up care with pt. Pt verbalized understanding 

## 2015-12-28 ENCOUNTER — Encounter: Payer: Self-pay | Admitting: Family Medicine

## 2015-12-28 ENCOUNTER — Other Ambulatory Visit
Admission: RE | Admit: 2015-12-28 | Discharge: 2015-12-28 | Disposition: A | Discharge status: Home / Self Care | Payer: 59 | Source: Ambulatory Visit | Attending: Family Medicine | Admitting: Family Medicine

## 2015-12-28 ENCOUNTER — Ambulatory Visit (INDEPENDENT_AMBULATORY_CARE_PROVIDER_SITE_OTHER): Payer: 59 | Admitting: Family Medicine

## 2015-12-28 VITALS — BP 99/67 | HR 76 | Temp 98.1°F | Resp 16 | Ht 63.0 in | Wt 127.0 lb

## 2015-12-28 DIAGNOSIS — Z8342 Family history of familial hypercholesterolemia: Secondary | ICD-10-CM | POA: Insufficient documentation

## 2015-12-28 DIAGNOSIS — R42 Dizziness and giddiness: Secondary | ICD-10-CM | POA: Diagnosis not present

## 2015-12-28 DIAGNOSIS — R202 Paresthesia of skin: Secondary | ICD-10-CM | POA: Diagnosis not present

## 2015-12-28 DIAGNOSIS — G43009 Migraine without aura, not intractable, without status migrainosus: Secondary | ICD-10-CM

## 2015-12-28 LAB — COMPREHENSIVE METABOLIC PANEL
ALBUMIN: 4.3 g/dL (ref 3.5–5.0)
ALT: 22 U/L (ref 14–54)
ANION GAP: 6 (ref 5–15)
AST: 19 U/L (ref 15–41)
Alkaline Phosphatase: 56 U/L (ref 38–126)
BILIRUBIN TOTAL: 0.9 mg/dL (ref 0.3–1.2)
BUN: 12 mg/dL (ref 6–20)
CHLORIDE: 108 mmol/L (ref 101–111)
CO2: 28 mmol/L (ref 22–32)
Calcium: 9.4 mg/dL (ref 8.9–10.3)
Creatinine, Ser: 0.47 mg/dL (ref 0.44–1.00)
GFR calc Af Amer: >60 mL/min (ref >60)
GFR calc non Af Amer: >60 mL/min (ref >60)
GLUCOSE: 85 mg/dL (ref 65–99)
POTASSIUM: 4 mmol/L (ref 3.5–5.1)
Sodium: 142 mmol/L (ref 135–145)
TOTAL PROTEIN: 7.2 g/dL (ref 6.5–8.1)

## 2015-12-28 LAB — CBC WITH DIFFERENTIAL/PLATELET
BASOS ABS: 0 10*3/uL (ref 0–0.1)
Basophils Relative: 1 %
Eosinophils Absolute: 0.1 10*3/uL (ref 0–0.7)
Eosinophils Relative: 2 %
HEMATOCRIT: 35.7 % (ref 35.0–47.0)
Hemoglobin: 12.4 g/dL (ref 12.0–16.0)
LYMPHS ABS: 1.2 10*3/uL (ref 1.0–3.6)
LYMPHS PCT: 19 %
MCH: 30.8 pg (ref 26.0–34.0)
MCHC: 34.9 g/dL (ref 32.0–36.0)
MCV: 88.3 fL (ref 80.0–100.0)
MONO ABS: 0.6 10*3/uL (ref 0.2–0.9)
Monocytes Relative: 9 %
NEUTROS ABS: 4.3 10*3/uL (ref 1.4–6.5)
Neutrophils Relative %: 69 %
Platelets: 225 10*3/uL (ref 150–440)
RBC: 4.04 MIL/uL (ref 3.80–5.20)
RDW: 13.5 % (ref 11.5–14.5)
WBC: 6.2 10*3/uL (ref 3.6–11.0)

## 2015-12-28 LAB — TSH: TSH: 0.488 u[IU]/mL (ref 0.350–4.500)

## 2015-12-28 LAB — LIPID PANEL
CHOL/HDL RATIO: 3 ratio
Cholesterol: 175 mg/dL (ref 0–200)
HDL: 58 mg/dL (ref >40)
LDL CALC: 107 mg/dL — AB (ref 0–99)
Triglycerides: 48 mg/dL (ref <150)
VLDL: 10 mg/dL (ref 0–40)

## 2015-12-28 LAB — HEMOGLOBIN A1C: HEMOGLOBIN A1C: 4.3 % (ref 4.0–6.0)

## 2015-12-28 LAB — VITAMIN B12: Vitamin B-12: 398 pg/mL (ref 180–914)

## 2015-12-28 MED ORDER — SUMATRIPTAN SUCCINATE 50 MG PO TABS: ORAL_TABLET | ORAL | Status: DC

## 2015-12-28 MED ORDER — IBUPROFEN 600 MG PO TABS: 600.0000 mg | ORAL_TABLET | Freq: Four times a day (QID) | ORAL | Status: DC | PRN

## 2015-12-28 MED ORDER — ASPIRIN-ACETAMINOPHEN-CAFFEINE 250-250-65 MG PO TABS: 1.0000 | ORAL_TABLET | Freq: Four times a day (QID) | ORAL | Status: DC | PRN
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
## 2015-12-28 NOTE — Patient Instructions (Signed)
I think your symptoms are migraine related. Let's try imitrex to see if your headaches improve. I would like you to follow-up with neurology on the 27th for a work-up of your symptoms.   Take 1 imitrex at the start of a HA and then can repeat 2 hours later. Also try Excedrin migrine over the counter to see if that helps your symptoms.
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
## 2015-12-28 NOTE — Assessment & Plan Note (Addendum)
Suspect symptoms may be migraine related as they are occurring with HA.  Agree with neurology consult to review patient symptoms. Try ibuprofen and excedrin migraine for HA symptoms. Trial of imitrex for severe HA.  Alarm symptoms reviewed. RTC 6 weeks.

## 2015-12-30 ENCOUNTER — Telehealth: Payer: Self-pay

## 2015-12-30 NOTE — Telephone Encounter (Signed)
Rasa from [facility] post part[facility], for [facility], to inquire if pt was seen recently for her depression. Information given.

## 2016-01-18 ENCOUNTER — Ambulatory Visit (INDEPENDENT_AMBULATORY_CARE_PROVIDER_SITE_OTHER): Payer: 59 | Admitting: Neurology

## 2016-01-18 ENCOUNTER — Encounter: Payer: Self-pay | Admitting: Neurology

## 2016-01-18 VITALS — BP 122/66 | HR 94 | Ht 63.0 in | Wt 123.0 lb

## 2016-01-18 DIAGNOSIS — M792 Neuralgia and neuritis, unspecified: Secondary | ICD-10-CM

## 2016-01-18 DIAGNOSIS — G43709 Chronic migraine without aura, not intractable, without status migrainosus: Secondary | ICD-10-CM

## 2016-01-18 DIAGNOSIS — G609 Hereditary and idiopathic neuropathy, unspecified: Secondary | ICD-10-CM | POA: Diagnosis not present

## 2016-01-18 DIAGNOSIS — R519 Headache, unspecified: Secondary | ICD-10-CM

## 2016-01-18 DIAGNOSIS — R51 Headache: Secondary | ICD-10-CM

## 2016-01-18 HISTORY — DX: Chronic migraine without aura, not intractable, without status migrainosus: G43.709

## 2016-01-18 MED ORDER — NORTRIPTYLINE HCL 10 MG PO CAPS: 10.0000 mg | ORAL_CAPSULE | Freq: Every day | ORAL | Status: DC

## 2016-01-18 NOTE — Patient Instructions (Signed)
Migraine Recommendations: 1.  Start nortriptyline  at bedtime.  Call in 4 weeks with update and we can adjust dose if needed. 2.  Take Excedrin Migraine at earliest onset of headache.  If headache not improved after 30 to 45 minutes, then take sumatriptan .  May repeat dose once in 2 hours if needed.  Do not exceed two tablets in 24 hours. 3.  Limit use of pain relievers to no more than 2 days out of the week.  This will help reduce risk of rebound headaches. 4.  Be aware of common food triggers such as processed sweets, processed foods with nitrites (such as deli meat, hot dogs, sausages), foods with MSG, alcohol (such as wine), chocolate, certain cheeses, certain fruits (dried fruits, some citrus fruit), vinegar, diet soda. 4.  Avoid caffeine 5.  Routine exercise 6.  Proper sleep hygiene 7.  Stay adequately hydrated with water 8.  Keep a headache diary. 9.  Maintain proper stress management. 10.  Do not skip meals. 11.  Consider supplements:  Magnesium oxide  to  daily, riboflavin , Coenzyme Q 10  three times daily 12.  MRI of brain  13.  Follow up in 3 to 4 months.  CALL IN 4 WEEKS WITH UPDATE.
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
## 2016-01-22 ENCOUNTER — Telehealth: Payer: Self-pay | Admitting: Neurology

## 2016-01-22 MED ORDER — NORTRIPTYLINE HCL 10 MG PO CAPS: 10.0000 mg | ORAL_CAPSULE | Freq: Every day | ORAL | Status: DC

## 2016-01-22 NOTE — Telephone Encounter (Signed)
RX resent

## 2016-01-22 NOTE — Telephone Encounter (Signed)
Pt needs to have her Nortriptyline called in to the [facility] in [place] they state they never go it thank you for the referral [phone] or [phone]

## 2016-01-28 ENCOUNTER — Inpatient Hospital Stay: Admission: RE | Admit: 2016-01-28 | Payer: 59 | Source: Ambulatory Visit

## 2016-03-31 ENCOUNTER — Ambulatory Visit: Payer: 59 | Admitting: Family Medicine

## 2016-05-12 ENCOUNTER — Ambulatory Visit (INDEPENDENT_AMBULATORY_CARE_PROVIDER_SITE_OTHER): Payer: 59 | Admitting: Family Medicine

## 2016-05-12 VITALS — BP 102/65 | HR 80 | Temp 98.4°F | Resp 16 | Ht 63.0 in | Wt 122.0 lb

## 2016-05-12 DIAGNOSIS — A09 Infectious gastroenteritis and colitis, unspecified: Secondary | ICD-10-CM

## 2016-05-12 DIAGNOSIS — Z3041 Encounter for surveillance of contraceptive pills: Secondary | ICD-10-CM

## 2016-05-12 DIAGNOSIS — R197 Diarrhea, unspecified: Secondary | ICD-10-CM

## 2016-05-12 MED ORDER — NORGESTIM-ETH ESTRAD TRIPHASIC 0.18/0.215/0.25 MG-25 MCG PO TABS: 1.0000 | ORAL_TABLET | Freq: Every day | ORAL | Status: DC

## 2016-05-12 MED ORDER — ONDANSETRON HCL 4 MG PO TABS: 4.0000 mg | ORAL_TABLET | Freq: Three times a day (TID) | ORAL | Status: DC | PRN

## 2016-05-12 NOTE — Progress Notes (Signed)
Subjective:    Patient ID: Jeanelle MallingCarla M Roston, female    DOB: Jul 17, 1986, 30 y.o.   MRN: [id number]  HPI: Jeanelle MallingCarla M Ing is a 30 y.o. female presenting on 05/12/2016 for Diarrhea   HPI  Pt presents for diarrhea x 6 days. [place]te at the beach and immediately ill- had crab ravioli. No emesis. Just diarrhea. Not bloody. Brown/green in color. Abdominal pain and cramping prior. Can drink. When she eats she feels sick. Home treatment: Pepto and immodium.   Past Medical History  Diagnosis Date  . Medical history non-contributory   . Need for Tdap vaccination     at 28 weeks  . Anxiety and depression   . Headache, tension-type   . Bleeding nose   . 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 Yonan today for follow up regarding left inguinal adenopathy with history of right caseating granulomatous lymphadenitis with resection in 2013. She saw Ermalene Searingynthia Hanes in our department two weeks ago. Her case was discussed with Dr Noel Geroldohen [facility] and  differential diagnosis includes non-infectious disease process (ex. Sarcoidosis, malignancy), infectious disease (ex. viral or bacterial, including TB), fungal infection (ex. Blastomycosis, coccidioidomycosis, histoplasmosis). She was scheduled to see surgical oncology for a possible biopsy, but missed that appointment. She has appt with Dr. Rosemarie BeathFarel on 03/26/14.  We reviewed the results of her evaluation thus far:  03/03/14 CXR wnl 03/03/14 quantiferon TB test neg 03/03/14 CBC WBC 14 (wnl for pregnancy) 03/03/14 fungal antibodies negative  03/03/14 sed rate 28 and CRP 1.9 (mildly elevated although consistent with pregnancy)  She does report a dramatic improvement in the size of the left lymph node since completing a course  . Depression   . Anxiety     Current Outpatient Prescriptions on File Prior to Visit  Medication Sig  . aspirin-acetaminophen-caffeine (EXCEDRIN MIGRAINE) 250-250-65 MG tablet Take 1 tablet by mouth every 6 (six) hours as  needed for headache.  . loratadine (CLARITIN) 10 MG tablet Take 10 mg by mouth daily.   No current facility-administered medications on file prior to visit.    Review of Systems  Constitutional: Negative for fever and chills.  HENT: Negative.   Eyes: Negative.   Respiratory: Negative for chest tightness, shortness of breath and wheezing.   Cardiovascular: Negative for chest pain, palpitations and leg swelling.  Gastrointestinal: Positive for nausea, abdominal pain and diarrhea. Negative for vomiting and blood in stool.  Endocrine: Negative.   Genitourinary: Negative for urgency, decreased urine volume and difficulty urinating.  Musculoskeletal: Negative for neck pain and neck stiffness.  Skin: Negative for rash.  Neurological: Negative for dizziness, syncope and headaches.  Psychiatric/Behavioral: Negative.    Per HPI unless specifically indicated above     Objective:    BP 102/65 mmHg  Pulse 80  Temp(Src) 98.4 F (36.9 C) (Oral)  Resp 16  Ht 5\' 3"  (1.6 m)  Wt 122 lb (55.339 kg)  BMI 21.62 kg/m2  LMP 05/05/2016 (Approximate)  Wt Readings from Last 3 Encounters:  05/12/16 122 lb (55.339 kg)  01/18/16 123 lb (55.792 kg)  12/28/15 127 lb (57.607 kg)    Physical Exam  Constitutional: She appears well-developed and well-nourished. No distress.  Neck: Normal range of motion. Neck supple.  Cardiovascular: Normal rate and regular rhythm.  Exam reveals no gallop and no friction rub.   No murmur heard. Abdominal: Normal appearance. She exhibits no shifting dullness and no abdominal bruit. Bowel sounds are increased. There  is no hepatosplenomegaly. There is no tenderness. There is no rigidity and no guarding.  Lymphadenopathy:    She has no cervical adenopathy.  Skin: She is not diaphoretic.   Results for orders placed or performed during the hospital encounter of 12/28/15  Comprehensive metabolic panel  Result Value Ref Range   Sodium 142 135 - 145 mmol/L   Potassium 4.0  3.5 - 5.1 mmol/L   Chloride 108 101 - 111 mmol/L   CO2 28 22 - 32 mmol/L   Glucose, Bld 85 65 - 99 mg/dL   BUN 12 6 - 20 mg/dL   Creatinine, Ser 4.54 0.44 - 1.00 mg/dL   Calcium 9.4 8.9 - 09.8 mg/dL   Total Protein 7.2 6.5 - 8.1 g/dL   Albumin 4.3 3.5 - 5.0 g/dL   AST 19 15 - 41 U/L   ALT 22 14 - 54 U/L   Alkaline Phosphatase 56 38 - 126 U/L   Total Bilirubin 0.9 0.3 - 1.2 mg/dL   GFR calc non Af Amer >60 >60 mL/min   GFR calc Af Amer >60 >60 mL/min   Anion gap 6 5 - 15  CBC with Differential/Platelet  Result Value Ref Range   WBC 6.2 3.6 - 11.0 K/uL   RBC 4.04 3.80 - 5.20 MIL/uL   Hemoglobin 12.4 12.0 - 16.0 g/dL   HCT 11.9 14.7 - 82.9 %   MCV 88.3 80.0 - 100.0 fL   MCH 30.8 26.0 - 34.0 pg   MCHC 34.9 32.0 - 36.0 g/dL   RDW 56.2 13.0 - 86.5 %   Platelets 225 150 - 440 K/uL   Neutrophils Relative % 69 %   Neutro Abs 4.3 1.4 - 6.5 K/uL   Lymphocytes Relative 19 %   Lymphs Abs 1.2 1.0 - 3.6 K/uL   Monocytes Relative 9 %   Monocytes Absolute 0.6 0.2 - 0.9 K/uL   Eosinophils Relative 2 %   Eosinophils Absolute 0.1 0 - 0.7 K/uL   Basophils Relative 1 %   Basophils Absolute 0.0 0 - 0.1 K/uL  Lipid panel  Result Value Ref Range   Cholesterol 175 0 - 200 mg/dL   Triglycerides 48 <784 mg/dL   HDL 58 >69 mg/dL   Total CHOL/HDL Ratio 3.0 RATIO   VLDL 10 0 - 40 mg/dL   LDL Cholesterol 629 (H) 0 - 99 mg/dL  Hemoglobin B2W  Result Value Ref Range   Hgb A1c MFr Bld 4.3 4.0 - 6.0 %  Vitamin B12  Result Value Ref Range   Vitamin B-12 398 180 - 914 pg/mL  TSH  Result Value Ref Range   TSH 0.488 0.350 - 4.500 uIU/mL      Assessment & Plan:   Problem List Items Addressed This Visit    None    Visit Diagnoses    Diarrhea of presumed infectious origin    -  Primary    Stool studies to determine if patient has parasite. Supportive care at home. Alarm symptoms reviewed.     Relevant Orders    Stool Culture    Ova and parasite examination    Stool C-Diff Toxin Assay     Encounter for surveillance of contraceptive pills        Pt would like to change to orto-tri-cyclen lo    Relevant Medications    Norgestimate-Ethinyl Estradiol Triphasic 0.18/0.215/0.25 MG-25 MCG tab       Meds ordered this encounter  Medications  . acyclovir (ZOVIRAX) 400 MG tablet  Sig:   . ondansetron (ZOFRAN) 4 MG tablet    Sig: Take 1 tablet (4 mg total) by mouth every 8 (eight) hours as needed for nausea or vomiting.    Dispense:  20 tablet    Refill:  0    Order Specific Question:  Supervising Provider    Answer:  Janeann ForehandHAWKINS JR, JAMES H [phone][[id number]]  . Norgestimate-Ethinyl Estradiol Triphasic 0.18/0.215/0.25 MG-25 MCG tab    Sig: Take 1 tablet by mouth daily.    Dispense:  1 Package    Refill:  11    Order Specific Question:  Supervising Provider    Answer:  Janeann ForehandHAWKINS JR, JAMES H [phone][[id number]]      Follow up plan: Return if symptoms worsen or fail to improve.

## 2016-05-12 NOTE — Patient Instructions (Signed)
I think you have a stomach virus.  We will treat your nausea today. You can take zofran every 8 hours as needed for nausea. This medication can make you sleepy.  We will treat your symptoms with supportive care: Ensure you are drinking plenty of fluids- I recommend diluting gatorade 2oz to 6 oz water or drinking chicken broth for electroyltes and to boost your blood sugar. Start with bland meals when you feel up to it. Drinking is the most important- you do not need to eat if you don't feel well. Try peptobismal to help with your symptoms.  Alarm signs: If you noticed blood in your diarrhea, diarrhea continues for > 1.5-2 weeks without improving, shortness of breath, chest pain, or any concerning symptoms, please seek medical attention.  

## 2016-05-14 LAB — CLOSTRIDIUM DIFFICILE BY PCR: Toxigenic C. Difficile by PCR: DETECTED — CR

## 2016-05-14 LAB — C. DIFFICILE GDH AND TOXIN A/B
C. DIFFICILE GDH: DETECTED — AB
C. difficile Toxin A/B: NOT DETECTED

## 2016-05-16 ENCOUNTER — Telehealth: Payer: Self-pay | Admitting: *Deleted

## 2016-05-16 NOTE — Telephone Encounter (Signed)
Liborio NixonJanice from [facility] called and reported critical lab results to report. CB# [phone]. Dr. Hollace HaywardPlonk on call physician called patient and prescribed Flagyl. Called patient this morning to see how she was doing. Patient reports doing well. Nothing further needed at this time.

## 2016-05-17 LAB — OVA AND PARASITE EXAMINATION: OP: NONE SEEN

## 2016-05-20 LAB — STOOL CULTURE

## 2017-12-25 ENCOUNTER — Encounter: Payer: Self-pay | Admitting: Nurse Practitioner

## 2017-12-25 ENCOUNTER — Ambulatory Visit
Admission: RE | Admit: 2017-12-25 | Discharge: 2017-12-25 | Disposition: A | Discharge status: Home / Self Care | Payer: Medicaid Other | Source: Ambulatory Visit | Attending: Nurse Practitioner | Admitting: Nurse Practitioner

## 2017-12-25 ENCOUNTER — Ambulatory Visit: Payer: 59 | Admitting: Nurse Practitioner

## 2017-12-25 ENCOUNTER — Other Ambulatory Visit: Payer: Self-pay

## 2017-12-25 VITALS — BP 103/62 | HR 112 | Temp 100.2°F | Resp 18 | Ht 63.0 in | Wt 120.4 lb

## 2017-12-25 DIAGNOSIS — R509 Fever, unspecified: Secondary | ICD-10-CM

## 2017-12-25 DIAGNOSIS — R6889 Other general symptoms and signs: Secondary | ICD-10-CM

## 2017-12-25 DIAGNOSIS — R05 Cough: Secondary | ICD-10-CM

## 2017-12-25 DIAGNOSIS — F329 Major depressive disorder, single episode, unspecified: Secondary | ICD-10-CM

## 2017-12-25 DIAGNOSIS — F32A Depression, unspecified: Secondary | ICD-10-CM

## 2017-12-25 DIAGNOSIS — F419 Anxiety disorder, unspecified: Secondary | ICD-10-CM

## 2017-12-25 DIAGNOSIS — R059 Cough, unspecified: Secondary | ICD-10-CM

## 2017-12-25 LAB — POCT INFLUENZA A/B
Influenza A, POC: NEGATIVE
Influenza B, POC: NEGATIVE

## 2017-12-25 IMAGING — DX DG CHEST 2V
2 series · 2 of 2 positions shown · non-contrast
Comparison: [DATE]

CLINICAL DATA: Fever and chills.  Flu like symptoms, cough

EXAM:
CHEST  2 VIEW

[chest pa]
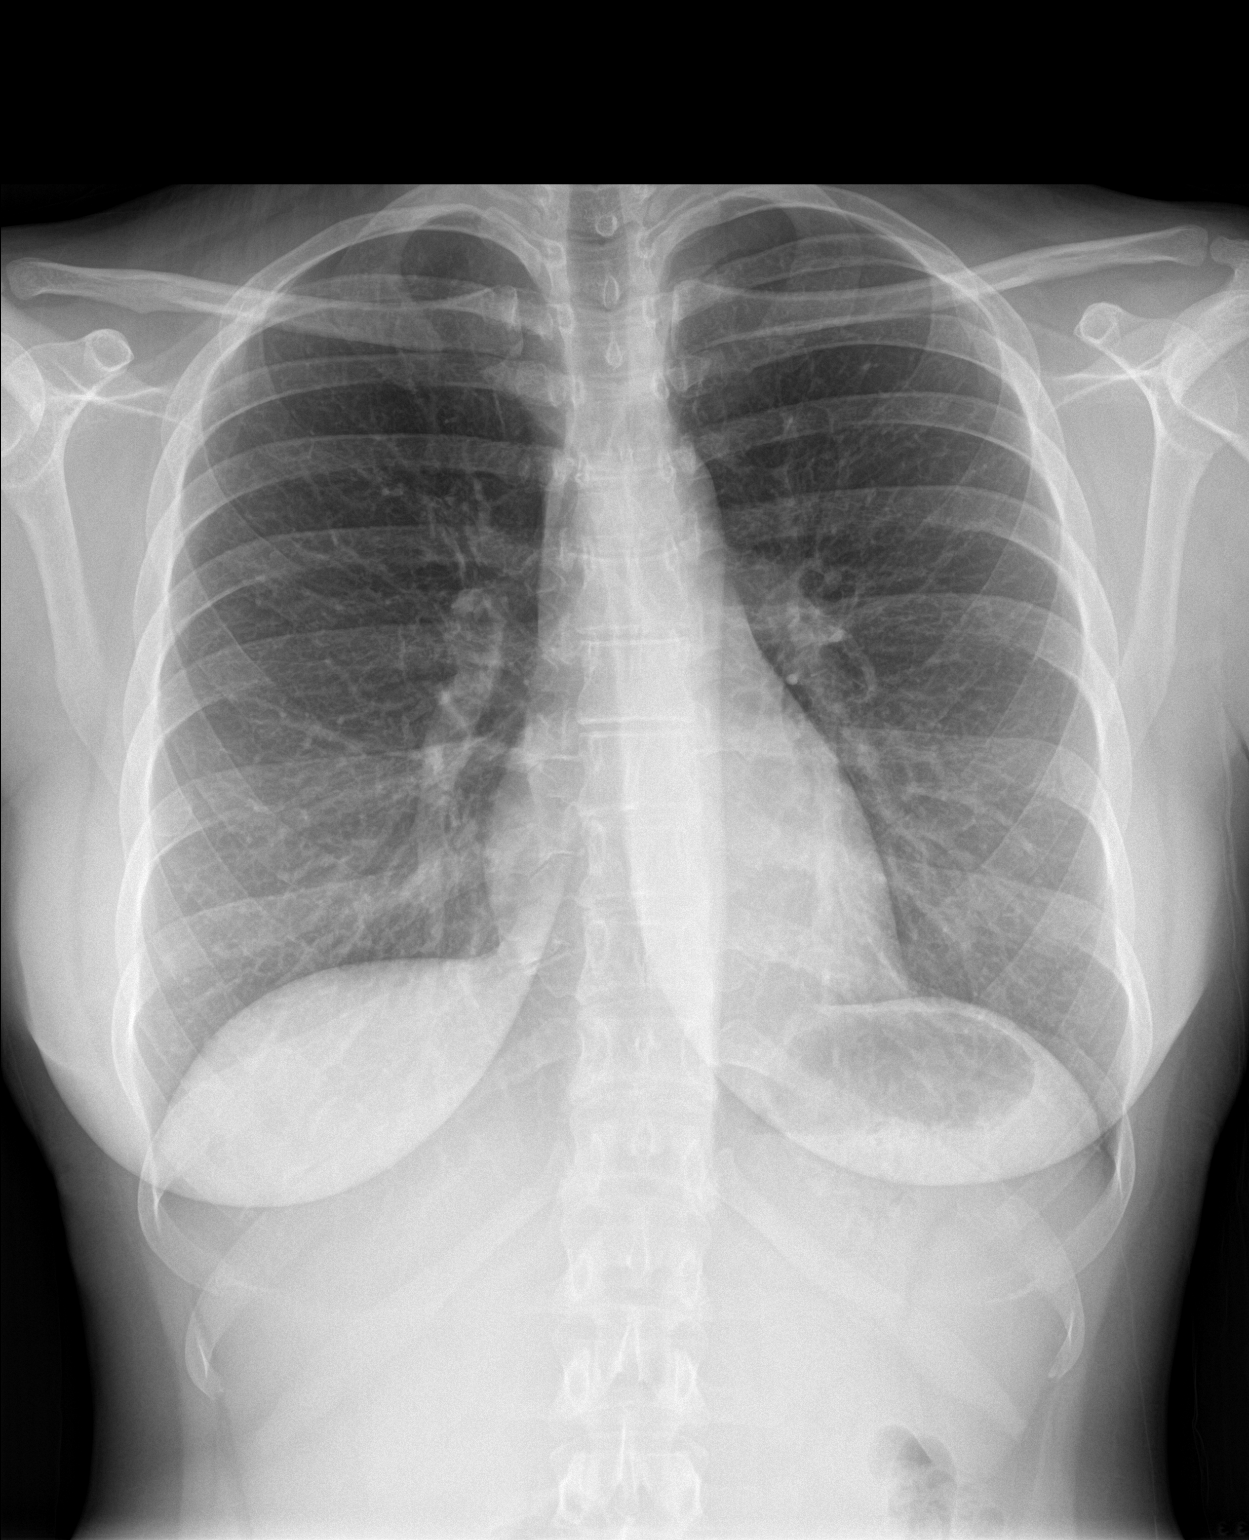

[chest lat]
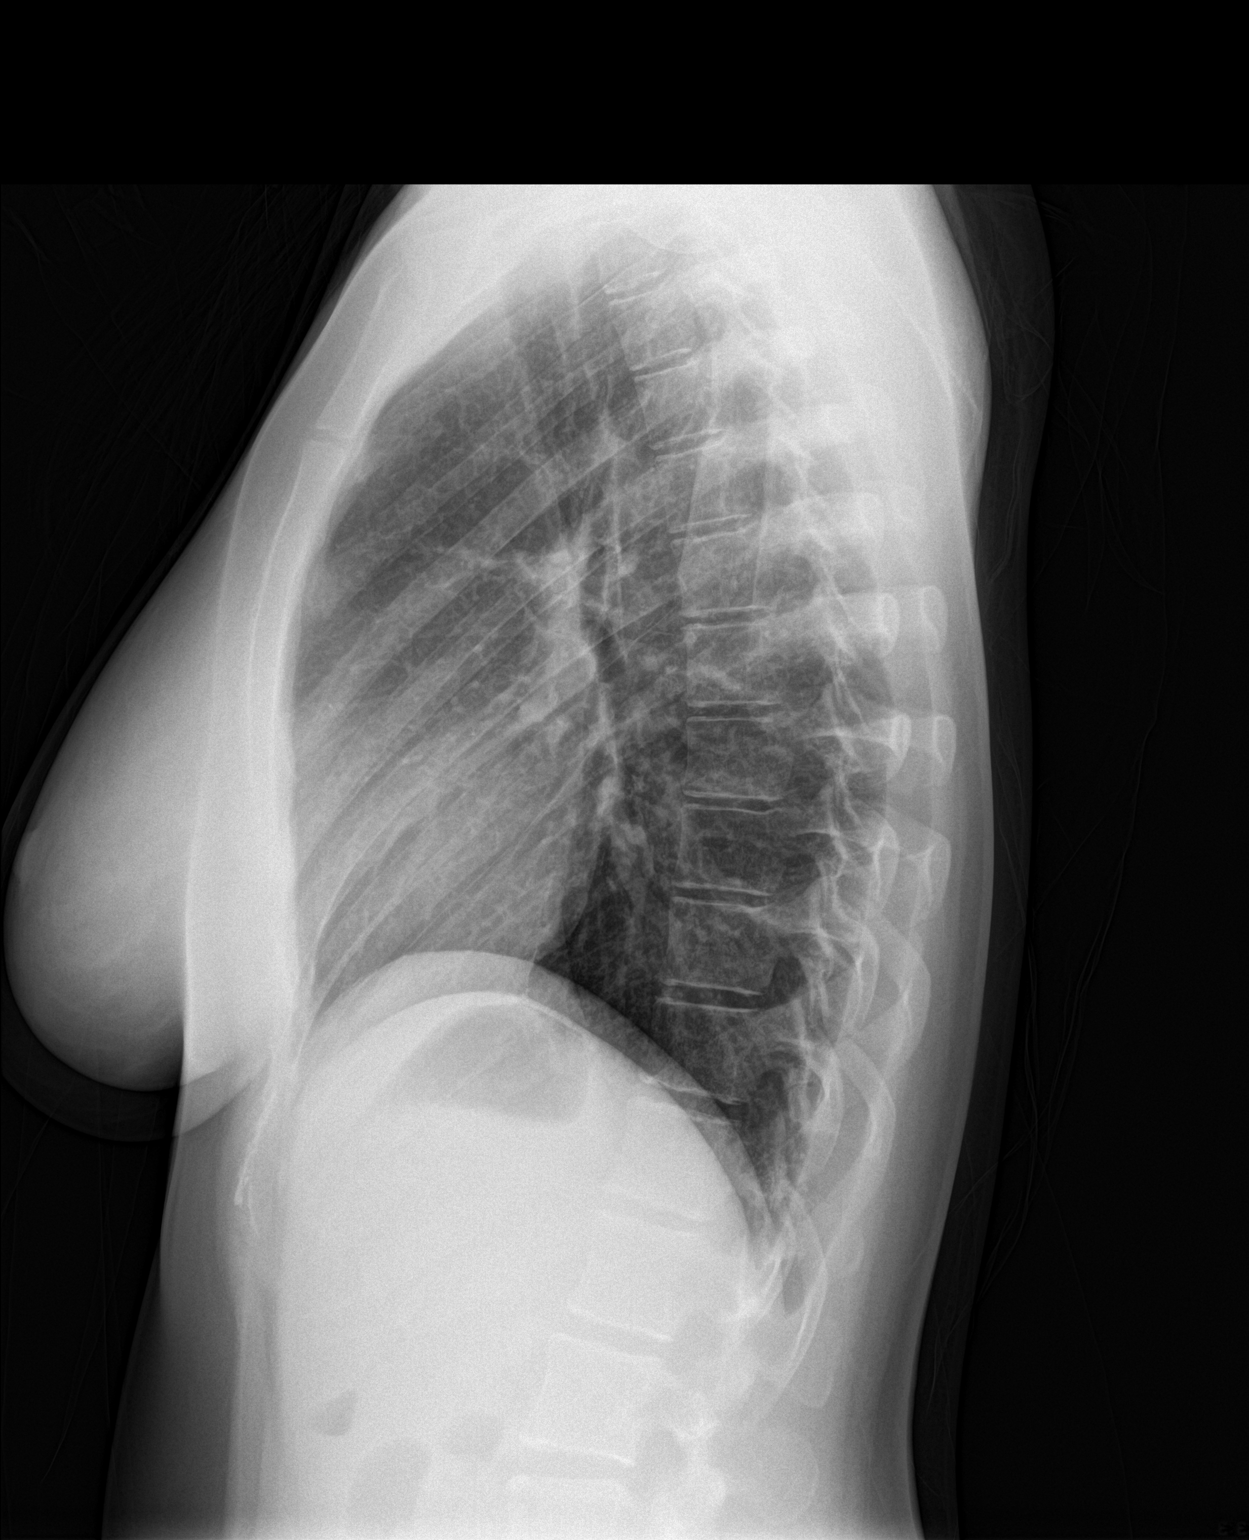

[2 of 2 positions shown; findings below may reference images not displayed]

FINDINGS: The heart size and mediastinal contours are within normal limits.
Both lungs are clear. The visualized skeletal structures are
unremarkable.
IMPRESSION: No active cardiopulmonary disease.

## 2017-12-25 MED ORDER — SERTRALINE HCL 50 MG PO TABS
50.0000 mg | ORAL_TABLET | Freq: Every day | ORAL | 1 refills | Status: DC
Start: 2017-12-25 — End: 2018-02-15

## 2017-12-25 MED ORDER — BUSPIRONE HCL 15 MG PO TABS: ORAL_TABLET | ORAL | 1 refills | Status: DC

## 2017-12-25 MED ORDER — BENZONATATE 100 MG PO CAPS: 100.0000 mg | ORAL_CAPSULE | Freq: Two times a day (BID) | ORAL | 0 refills | Status: AC | PRN

## 2017-12-25 MED ORDER — IPRATROPIUM BROMIDE 0.06 % NA SOLN: 2.0000 | Freq: Four times a day (QID) | NASAL | 0 refills | Status: DC

## 2017-12-25 MED ORDER — BUSPIRONE HCL 10 MG PO TABS: 10.0000 mg | ORAL_TABLET | Freq: Two times a day (BID) | ORAL | 1 refills | Status: DC

## 2017-12-25 MED ORDER — OSELTAMIVIR PHOSPHATE 75 MG PO CAPS: 75.0000 mg | ORAL_CAPSULE | Freq: Two times a day (BID) | ORAL | 0 refills | Status: AC

## 2017-12-25 NOTE — Patient Instructions (Addendum)
[name], Thank you for coming in to clinic today.   Your flu test was NEGATIVE.  It is possible you can still have the flu with a negative test, otherwise it could be a different virus causing your symptoms.  - Start Tamiflu (anti-flu medicine) take one capsule 75mg  twice a day for 5 days.  - Wash hands and cover cough very well to avoid spread of infection - For symptom control:      - Take Ibuprofen / Advil 400-600mg  every 6-8 hours as needed for fever / muscle aches.  You may also take Tylenol 500-1000mg  per dose every 6-8 hours or 3 times a day.  You can alternate dosing and take both in the same day.      - Do not take more than 3,000 mg acetaminophen (Tylenol) in a day.      - Start Tessalon perles one every 8 hours or 3 times a day as needed for cough.      - Start Atrovent nasal spray decongestant 2 sprays in each nostril up to 4 times daily for 7 days.      - Start OTC Mucinex-DM for cough and congestion for up to 7 days. - Improve hydration with plenty of clear fluids.  Drink up to 8 glasses of water / fluids each day.  If significant worsening with poor fluid intake, worsening fever, difficulty breathing due to coughing, worsening body aches, weakness, or other more concerning symptoms difficulty breathing you can seek treatment at Emergency Department. If your flu symptoms have improved and then get worse several days to a week later with concerns for bronchitis, productive cough, fever, and chills we may need to check for possible pneumonia that can occur after the flu.  For your anxiety and depression:  - Continue sertraline 50 mg once daily.  - START buspar 10 mg once daily for 1 week.  Increase to twice daily and continue.  Please schedule a follow-up appointment with [name], [name] in 1-2 weeks as needed if worsening from Flu / Bronchitis - AND followup in 6 weeks for anxiety and depression  If you have any other questions or concerns, please feel free to call the  clinic or send a message through MyChart. You may also schedule an earlier appointment if necessary.  You will receive a survey after today's visit either digitally by e-mail or paper by [employer] mail. Your experiences and feedback matter to us.  Please respond so we know how we are doing as we provide care for you.   Angela McardleLauren Juniper Cobey, [name], [name] Adult Gerontology Nurse Practitioner [facility], [facility]

## 2017-12-25 NOTE — Progress Notes (Signed)
Subjective:    Patient ID: [name], female    DOB: 14-Jul-1986, 32 y.o.   MRN: [id number]  [name] is a 32 y.o. female presenting on 12/25/2017 for Influenza (stomach pain, bodyaches, chills, and chest pain from coughing intermittent x 2 mth)   HPI Recurrent productive cough Hard to take deep breath. Has been sick on and off for 2 months.  Pt has continued non-productive cough and unresolving symptoms.  2 months ago, initial symptoms included "stomach virus" with nausea, diarrhea.  She then reports URI symptoms with non-productive cough, nasal congestion, rhinorrhea, sinus pressure.   These symptoms resolved and now, she is sick again.  Over last 2 days she has had bodyaches, chills, subjective fever, productive cough, rib pain from coughing, nausea, no vomiting, No diarrhea or constipation. - Has taken Tylenol/ibuprofen cold/flu, but makes sleepy. - Headache and dizziness intermittently. - She has had contact with her daughter who has had confirmed positive flu A test.  Depression Has been seeing a therapist.  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  Has started sertraline 50 mg once daily.  Took for several months and stopped 2 weeks ago 2/2 feeling bad.  She is now in school and has not had as much time to go to her therapist and is not getting this supportive/non-pharm therapy.  Pt reports she has had improvement of depression on sertraline, but has started experiencing more symptoms of anxiety.  Depression screen [facility] 2/9 12/25/2017 12/28/2015  Decreased Interest 3 0  Down, Depressed, Hopeless 3 0  PHQ - 2 Score 6 0  Altered sleeping 3 0  Tired, decreased energy 3 1  Change in appetite 1 0  Feeling bad or failure about yourself  1 0  Trouble concentrating 3 0  Moving slowly or fidgety/restless 1 0  Suicidal thoughts 1 -  PHQ-9 Score 19 1  Difficult doing work/chores Somewhat difficult Not difficult at all   GAD 7 : Generalized Anxiety Score 12/25/2017 12/28/2015    Nervous, Anxious, on Edge 3 1  Control/stop worrying 3 0  Worry too much - different things 3 1  Trouble relaxing 3 1  Restless 1 0  Easily annoyed or irritable 3 1  Afraid - awful might happen 1 0  Total GAD 7 Score 17 4  Anxiety Difficulty Somewhat difficult Not difficult at all    Social History   Tobacco Use  . Smoking status: Never Smoker  . Smokeless tobacco: Never Used  Substance Use Topics  . Alcohol use: No  . Drug use: No    Review of Systems Per HPI unless specifically indicated above     Objective:    BP 103/62 (BP Location: Right Arm, Patient Position: Sitting, Cuff Size: Small)   Pulse (!) 112   Temp 100.2 F (37.9 C) (Oral)   Resp 18   Ht 5\' 3"  (1.6 m)   Wt 120 lb 6.4 oz (54.6 kg)   LMP 12/11/2017 (Approximate)   SpO2 100%   BMI 21.33 kg/m   Wt Readings from Last 3 Encounters:  12/25/17 120 lb 6.4 oz (54.6 kg)  05/12/16 122 lb (55.3 kg)  01/18/16 123 lb (55.8 kg)    Physical Exam  Constitutional: She appears well-developed and well-nourished. She appears distressed (mildly).  HENT:  Head: Normocephalic and atraumatic.  Right Ear: Hearing, tympanic membrane, external ear and ear canal normal.  Left Ear: Hearing, tympanic membrane, external ear and ear canal normal.  Nose: Mucosal edema and  rhinorrhea present. Right sinus exhibits maxillary sinus tenderness and frontal sinus tenderness. Left sinus exhibits maxillary sinus tenderness and frontal sinus tenderness.  Mouth/Throat: Uvula is midline and mucous membranes are normal. Posterior oropharyngeal edema (cobblestoning) present. Oropharyngeal exudate: clear secretions.  Neck: Normal range of motion. Neck supple.  Cardiovascular: Normal rate, regular rhythm, S1 normal, S2 normal, normal heart sounds and intact distal pulses.  Pulmonary/Chest: Effort normal and breath sounds normal. No respiratory distress.  Lymphadenopathy:    She has cervical adenopathy.  Neurological: She is alert.  Skin: Skin  is warm and dry.  Psychiatric: She has a normal mood and affect. Her behavior is normal.  Vitals reviewed.    Results for orders placed or performed in visit on 12/25/17  POCT Influenza A/B  Result Value Ref Range   Influenza A, POC Negative Negative   Influenza B, POC Negative Negative      Assessment & Plan:   Problem List Items Addressed This Visit      Other   Anxiety and depression    Pt with currently uncontrolled anxiety and depression on sertraline 50 mg once daily.  Pt without use of therapist recently 2/2 busy schedule, but had found benefit in this therapy prior to school.  PHQ9 and GAD7 with persistently elevated scores.  Pt report of anxiety being most difficulty to control.  Plan: 1. Continue sertraline 50 mg once daily.  Consider increase to 100 mg if needed at next visit. 2. START buspirone 10 mg twice daily for anxiety.   3. Encouraged self-care and non-pharm management (healthy diet, exercise, deep breathing exercises). 4. Followup 6 weeks.      Relevant Medications   sertraline (ZOLOFT) 50 MG tablet   busPIRone (BUSPAR) 15 MG tablet    Other Visit Diagnoses    Fever and chills    -  Primary   Relevant Medications   oseltamivir (TAMIFLU) 75 MG capsule   Other Relevant Orders   POCT Influenza A/B (Completed)   DG Chest 2 View (Completed)   Flu-like symptoms       Relevant Medications   oseltamivir (TAMIFLU) 75 MG capsule   benzonatate (TESSALON) 100 MG capsule   ipratropium (ATROVENT) 0.06 % nasal spray   Other Relevant Orders   DG Chest 2 View (Completed)   Cough       Relevant Medications   benzonatate (TESSALON) 100 MG capsule   Other Relevant Orders   DG Chest 2 View (Completed)     Clinically diagnosed influenza despite negative rapid flu test today, concern for flu still due to significant known exposure to positive influenza with confirmed positive infection of her daughter. - Duration x 2 days, without complication. Tolerating PO and well  hydrated - No other focal findings of infection today - Did notreceive influenza vaccine this season  Plan: 1. Start Tamiflu 75mg  capsules BID x 5 days 2. Supportive care as advised with NSAID / Tylenol PRN fever/myalgias, improve hydration, may take OTC Cold/Flu meds 3. Given recurrent infection over last 2 weeks,cough, and breath sounds on exam, Chest Xray today to rule out pneumonia. 4. Return criteria given if significant worsening, consider post-influenza complications, otherwise follow-up if needed  Meds ordered this encounter  Medications  . oseltamivir (TAMIFLU) 75 MG capsule    Sig: Take 1 capsule (75 mg total) by mouth 2 (two) times daily for 5 days.    Dispense:  10 capsule    Refill:  0    Order Specific Question:  Supervising Provider    Answer:   Smitty Cords [[id number]]  . benzonatate (TESSALON) 100 MG capsule    Sig: Take 1 capsule (100 mg total) by mouth 2 (two) times daily as needed for up to 10 days for cough.    Dispense:  20 capsule    Refill:  0    Order Specific Question:   Supervising Provider    Answer:   Smitty Cords [[id number]]  . ipratropium (ATROVENT) 0.06 % nasal spray    Sig: Place 2 sprays into both nostrils 4 (four) times daily.    Dispense:  15 mL    Refill:  0    Order Specific Question:   Supervising Provider    Answer:   Smitty Cords [[id number]]  . DISCONTD: busPIRone (BUSPAR) 10 MG tablet    Sig: Take 1 tablet (10 mg total) by mouth 2 (two) times daily.    Dispense:  60 tablet    Refill:  1    Order Specific Question:   Supervising Provider    Answer:   Smitty Cords [[id number]]  . sertraline (ZOLOFT) 50 MG tablet    Sig: Take 1 tablet (50 mg total) by mouth daily.    Dispense:  30 tablet    Refill:  1    Order Specific Question:   Supervising Provider    Answer:   Smitty Cords [[id number]]  . busPIRone (BUSPAR) 15 MG tablet    Sig: Take 2/3 tablet (10 mg total) twice daily.    Dispense:  40 tablet     Refill:  1    If you have refill of 10 mg tablets at future fill, please fill Buspirone 10 mg tablet 1 tab po twice daily #60 tab no additional refills.    Order Specific Question:   Supervising Provider    Answer:   Smitty Cords [[id number]]      Follow up plan: Return in about 6 weeks (around 02/05/2018) for anxiety and depression.  A total of 35 minutes was spent face-to-face with this patient. Greater than 50% of this time was spent in counseling and coordination of care with the patient.  [name], DNP, AGPCNP-BC Adult Gerontology Primary Care Nurse Practitioner [facility] 12/29/2017, 9:50 AM

## 2017-12-29 ENCOUNTER — Encounter: Payer: Self-pay | Admitting: Nurse Practitioner

## 2017-12-29 DIAGNOSIS — F419 Anxiety disorder, unspecified: Secondary | ICD-10-CM

## 2017-12-29 DIAGNOSIS — F329 Major depressive disorder, single episode, unspecified: Secondary | ICD-10-CM | POA: Insufficient documentation

## 2017-12-29 NOTE — Assessment & Plan Note (Signed)
Pt with currently uncontrolled anxiety and depression on sertraline 50 mg once daily.  Pt without use of therapist recently 2/2 busy schedule, but had found benefit in this therapy prior to school.  PHQ9 and GAD7 with persistently elevated scores.  Pt report of anxiety being most difficulty to control.  Plan: 1. Continue sertraline 50 mg once daily.  Consider increase to 100 mg if needed at next visit. 2. START buspirone 10 mg twice daily for anxiety.   3. Encouraged self-care and non-pharm management (healthy diet, exercise, deep breathing exercises). 4. Followup 6 weeks.

## 2018-01-04 ENCOUNTER — Telehealth: Payer: Self-pay | Admitting: Nurse Practitioner

## 2018-01-04 NOTE — Telephone Encounter (Signed)
Pt is waiting on phone call about medication [phone]

## 2018-01-25 ENCOUNTER — Other Ambulatory Visit: Payer: Self-pay | Admitting: Nurse Practitioner

## 2018-01-25 DIAGNOSIS — R6889 Other general symptoms and signs: Secondary | ICD-10-CM

## 2018-02-08 ENCOUNTER — Ambulatory Visit: Payer: Medicaid Other | Admitting: Nurse Practitioner

## 2018-02-15 ENCOUNTER — Encounter: Payer: Self-pay | Admitting: Nurse Practitioner

## 2018-02-15 ENCOUNTER — Ambulatory Visit (INDEPENDENT_AMBULATORY_CARE_PROVIDER_SITE_OTHER): Payer: Medicaid Other | Admitting: Nurse Practitioner

## 2018-02-15 ENCOUNTER — Other Ambulatory Visit: Payer: Self-pay

## 2018-02-15 VITALS — BP 91/75 | HR 81 | Temp 98.4°F | Ht 63.0 in | Wt 125.8 lb

## 2018-02-15 DIAGNOSIS — N760 Acute vaginitis: Secondary | ICD-10-CM | POA: Diagnosis not present

## 2018-02-15 DIAGNOSIS — B373 Candidiasis of vulva and vagina: Secondary | ICD-10-CM

## 2018-02-15 DIAGNOSIS — F419 Anxiety disorder, unspecified: Secondary | ICD-10-CM

## 2018-02-15 DIAGNOSIS — F32A Depression, unspecified: Secondary | ICD-10-CM

## 2018-02-15 DIAGNOSIS — Z3041 Encounter for surveillance of contraceptive pills: Secondary | ICD-10-CM | POA: Diagnosis not present

## 2018-02-15 DIAGNOSIS — F329 Major depressive disorder, single episode, unspecified: Secondary | ICD-10-CM | POA: Diagnosis not present

## 2018-02-15 DIAGNOSIS — B3731 Acute candidiasis of vulva and vagina: Secondary | ICD-10-CM

## 2018-02-15 DIAGNOSIS — B9689 Other specified bacterial agents as the cause of diseases classified elsewhere: Secondary | ICD-10-CM

## 2018-02-15 MED ORDER — FLUCONAZOLE 150 MG PO TABS: 150.0000 mg | ORAL_TABLET | Freq: Once | ORAL | 0 refills | Status: AC

## 2018-02-15 MED ORDER — BUSPIRONE HCL 7.5 MG PO TABS: 7.5000 mg | ORAL_TABLET | Freq: Two times a day (BID) | ORAL | 2 refills | Status: DC

## 2018-02-15 MED ORDER — METRONIDAZOLE 0.75 % VA GEL: 1.0000 | Freq: Every day | VAGINAL | 0 refills | Status: AC

## 2018-02-15 MED ORDER — SERTRALINE HCL 100 MG PO TABS: 100.0000 mg | ORAL_TABLET | Freq: Every day | ORAL | 2 refills | Status: DC

## 2018-02-15 MED ORDER — NORGESTIM-ETH ESTRAD TRIPHASIC 0.18/0.215/0.25 MG-25 MCG PO TABS: 1.0000 | ORAL_TABLET | Freq: Every day | ORAL | 1 refills | Status: DC

## 2018-02-15 NOTE — Progress Notes (Signed)
Subjective:    Patient ID: [name], female    DOB: May 09, 1986, 32 y.o.   MRN: [id number]  [name] is a 32 y.o. female presenting on 02/15/2018 for Anxiety (concern that the medication is causing drowiness, intermittent headache) and Rash (itching on bilateral legs and arms )   HPI Anxiety Patient presents today for follow-up of her anxiety.  She notes significant improvement on new medications particularly that she has less irritablity with her kids, less panic.  She also notes that she is tired after taking her doses of BuSpar throughout the day.  New rash arms and legs patient is concerned could be coming from her new medications. Doing well with stress management.  Has used fluoxetine in past without response.  Is willing to try higher dose sertraline prior to change.  Has not used paroxetine.  Vaginal discharge -patient notes increased vaginal discharge over the last several weeks.  She has used over-the-counter antifungal applications.  Patient notes vaginal discharge changes.  Sometimes it is thin, white; some thick, white always without odor.   GAD 7 : Generalized Anxiety Score 02/15/2018 12/25/2017 12/28/2015  Nervous, Anxious, on Edge 1 3 1   Control/stop worrying 1 3 0  Worry too much - different things 1 3 1   Trouble relaxing 3 3 1   Restless 0 1 0  Easily annoyed or irritable 3 3 1   Afraid - awful might happen 0 1 0  Total GAD 7 Score 9 17 4   Anxiety Difficulty Very difficult Somewhat difficult Not difficult at all    Depression screen [facility] 2/9 02/15/2018 12/25/2017 12/28/2015  Decreased Interest 1 3 0  Down, Depressed, Hopeless 1 3 0  PHQ - 2 Score 2 6 0  Altered sleeping 1 3 0  Tired, decreased energy 3 3 1   Change in appetite 0 1 0  Feeling bad or failure about yourself  0 1 0  Trouble concentrating 3 3 0  Moving slowly or fidgety/restless 0 1 0  Suicidal thoughts 0 1 -  PHQ-9 Score 9 19 1   Difficult doing work/chores Very difficult Somewhat difficult Not  difficult at all    Social History   Tobacco Use  . Smoking status: Never Smoker  . Smokeless tobacco: Never Used  Substance Use Topics  . Alcohol use: No  . Drug use: No    Review of Systems Per HPI unless specifically indicated above     Objective:    BP 91/75 (BP Location: Right Arm, Cuff Size: Normal)   Pulse 81   Temp 98.4 F (36.9 C) (Oral)   Ht 5\' 3"  (1.6 m)   Wt 125 lb 12.8 oz (57.1 kg)   BMI 22.28 kg/m   Wt Readings from Last 3 Encounters:  02/15/18 125 lb 12.8 oz (57.1 kg)  12/25/17 120 lb 6.4 oz (54.6 kg)  05/12/16 122 lb (55.3 kg)    Physical Exam  Constitutional: She is oriented to person, place, and time. She appears well-developed and well-nourished. No distress.  HENT:  Head: Normocephalic and atraumatic.  Cardiovascular: Normal rate, regular rhythm, S1 normal, S2 normal, normal heart sounds and intact distal pulses.  Pulmonary/Chest: Effort normal and breath sounds normal. No respiratory distress.  Neurological: She is alert and oriented to person, place, and time.  Skin: Skin is warm and dry.  Psychiatric: She has a normal mood and affect. Her speech is normal and behavior is normal. Judgment and thought content normal. Cognition and memory are normal.  Vitals  reviewed.    Results for orders placed or performed in visit on 12/25/17  POCT Influenza A/B  Result Value Ref Range   Influenza A, POC Negative Negative   Influenza B, POC Negative Negative      Assessment & Plan:   Problem List Items Addressed This Visit      Other   Anxiety and depression Improved anxiety and depression by 50% over last visit.  Patient started sertraline 50 mg once daily and buspirone 10 mg twice daily.  Patient reports possible new rash related to medications.  Rash began 1 week after starting new meds.  However, cannot exclude alternative allergy.  Patient willing to continue medications despite rash.  Other side effect of drowsiness also possibly related to BuSpar  dose.  Plan: 1.  Change BuSpar dose to 7.5 mg twice daily. 2.  Increase sertraline to 100 mg daily. 3.  Continue to monitor for change in rash.  If worsens, can stop one medication and monitor for improvement/resolution of rash. 4.  Continue good stress management strategies. 5.  Follow-up 4 weeks with annual physical.   Relevant Medications   busPIRone (BUSPAR) 7.5 MG tablet   sertraline (ZOLOFT) 100 MG tablet    Other Visit Diagnoses    Vaginal candidiasis    -  Primary   Likely concurrent vaginal candidiasis with BV treat with Diflucan 150 mg tablet.  Take 1 tablet once for 1 dose.  Follow-up as needed.   BV (bacterial vaginosis)       Patient description of vaginal discharge indicates likely BV.  Start MetroGel 1 suppository nightly for 5 nights.  Also treat concurrent vaginal candidiasis   Relevant Medications   metroNIDAZOLE (METROGEL) 0.75 % vaginal gel   Encounter for surveillance of contraceptive pills       Patient would like to continue her current OCP.  She is due for annual physical and is willing to follow-up in 4 weeks.  30-day supply provided today   Relevant Medications   Norgestimate-Ethinyl Estradiol Triphasic 0.18/0.215/0.25 MG-25 MCG tab      Meds ordered this encounter  Medications  . busPIRone (BUSPAR) 7.5 MG tablet    Sig: Take 1 tablet (7.5 mg total) by mouth 2 (two) times daily.    Dispense:  60 tablet    Refill:  2    Order Specific Question:   Supervising Provider    Answer:   Smitty CordsKARAMALEGOS, ALEXANDER J [[id number]]  . fluconazole (DIFLUCAN) 150 MG tablet    Sig: Take 1 tablet (150 mg total) by mouth once for 1 dose.    Dispense:  1 tablet    Refill:  0    Order Specific Question:   Supervising Provider    Answer:   Smitty CordsKARAMALEGOS, ALEXANDER J [[id number]]  . metroNIDAZOLE (METROGEL) 0.75 % vaginal gel    Sig: Place 1 Applicatorful vaginally at bedtime for 5 days.    Dispense:  70 g    Refill:  0    Order Specific Question:   Supervising Provider    Answer:    Smitty CordsKARAMALEGOS, ALEXANDER J [[id number]]  . sertraline (ZOLOFT) 100 MG tablet    Sig: Take 1 tablet (100 mg total) by mouth daily.    Dispense:  30 tablet    Refill:  2    Order Specific Question:   Supervising Provider    Answer:   Smitty CordsKARAMALEGOS, ALEXANDER J [[id number]]  . Norgestimate-Ethinyl Estradiol Triphasic 0.18/0.215/0.25 MG-25 MCG tab    Sig: Take 1 tablet by mouth  daily.    Dispense:  1 Package    Refill:  1    Order Specific Question:   Supervising Provider    Answer:   Smitty Cords [[id number]]      Follow up plan: Return in about 1 month (around 03/15/2018), or annual physical and anxiety.   [name], DNP, AGPCNP-BC Adult Gerontology Primary Care Nurse Practitioner [facility] 02/17/2018, 9:45 PM

## 2018-02-15 NOTE — Patient Instructions (Addendum)
[name],   Thank you for coming in to clinic today.  Please let me know if there are any problems that arise with your medication changes.  Please schedule a follow-up appointment with Wilhelmina McardleLauren Conception Doebler, AGNP. Return in about 1 month (around 03/15/2018), or annual physical and anxiety.  If you have any other questions or concerns, please feel free to call the clinic or send a message through MyChart. You may also schedule an earlier appointment if necessary.  You will receive a survey after today's visit either digitally by e-mail or paper by [employer] mail. Your experiences and feedback matter to us.  Please respond so we know how we are doing as we provide care for you.   Wilhelmina McardleLauren Cirilo Canner, DNP, AGNP-BC Adult Gerontology Nurse Practitioner [facility], [facility]

## 2018-02-17 ENCOUNTER — Encounter: Payer: Self-pay | Admitting: Nurse Practitioner

## 2018-03-02 ENCOUNTER — Ambulatory Visit: Payer: Medicaid Other | Admitting: Nurse Practitioner

## 2018-03-02 ENCOUNTER — Encounter: Payer: Self-pay | Admitting: Nurse Practitioner

## 2018-03-02 VITALS — BP 113/66 | HR 84 | Temp 98.2°F | Ht 63.0 in | Wt 126.0 lb

## 2018-03-02 DIAGNOSIS — M545 Low back pain, unspecified: Secondary | ICD-10-CM

## 2018-03-02 DIAGNOSIS — F419 Anxiety disorder, unspecified: Secondary | ICD-10-CM | POA: Diagnosis not present

## 2018-03-02 DIAGNOSIS — F329 Major depressive disorder, single episode, unspecified: Secondary | ICD-10-CM

## 2018-03-02 DIAGNOSIS — F32A Depression, unspecified: Secondary | ICD-10-CM

## 2018-03-02 LAB — POCT URINALYSIS DIPSTICK
Bilirubin, UA: NEGATIVE
Blood, UA: NEGATIVE
Glucose, UA: NEGATIVE
Ketones, UA: NEGATIVE
Leukocytes, UA: NEGATIVE
Nitrite, UA: NEGATIVE
Odor: NEGATIVE
Protein, UA: NEGATIVE
Spec Grav, UA: 1.015 (ref 1.010–1.025)
Urobilinogen, UA: 0.2 E.U./dL
pH, UA: 5 (ref 5.0–8.0)

## 2018-03-02 MED ORDER — SERTRALINE HCL 50 MG PO TABS: 50.0000 mg | ORAL_TABLET | Freq: Every day | ORAL | 1 refills | Status: DC

## 2018-03-02 MED ORDER — BACLOFEN 10 MG PO TABS: 5.0000 mg | ORAL_TABLET | Freq: Three times a day (TID) | ORAL | 0 refills | Status: DC

## 2018-03-02 MED ORDER — PREDNISONE 20 MG PO TABS: ORAL_TABLET | ORAL | 0 refills | Status: DC

## 2018-03-02 MED ORDER — SERTRALINE HCL 100 MG PO TABS: 50.0000 mg | ORAL_TABLET | Freq: Every day | ORAL | 1 refills | Status: DC

## 2018-03-02 NOTE — Patient Instructions (Addendum)
[name],   Thank you for coming in to clinic today.  1. You have a lumbar back muscle strain. Likely caused by your stomach sleeping. - Start taking Tylenol extra strength 1 to 2 tablets every 6-8 hours for aches or fever/chills for next few days as needed.  Do not take more than 3,000 mg in 24 hours from all medicines.  - Take prednisone taper 20 mg tablets Day 1-4 (Today): Take 3 pills at one time Day 5-6: Take 2 pills  Day 7-8: Take 1 pills Day 9-10: Take 1/2 pill then stop.  AFTER prednisone, may take Ibuprofen as well if tolerated 200-400mg  every 8 hours as needed. May alternate tylenol and ibuprofen in same day. - Use heat and ice.  Apply this for 15 minutes at a time 6-8 times per day.   - Muscle rub with lidocaine, lidocaine patch, Biofreeze, or tiger balm for topical pain relief.  Avoid using this with heat and ice to avoid burns. - START muscle relaxer baclofen 10 mg one tablet up to three times daily.  This can cause drowsiness, so use caution.  It may be best to only take this at night for helping you during sleep.   Please schedule a follow-up appointment with Wilhelmina McardleLauren Girlie Veltri, AGNP. Return 2-4 weeks if symptoms worsen or fail to improve.  If you have any other questions or concerns, please feel free to call the clinic or send a message through MyChart. You may also schedule an earlier appointment if necessary.  You will receive a survey after today's visit either digitally by e-mail or paper by [employer] mail. Your experiences and feedback matter to us.  Please respond so we know how we are doing as we provide care for you.   Wilhelmina McardleLauren Sameka Bagent, DNP, AGNP-BC Adult Gerontology Nurse Practitioner [facility], [facility]

## 2018-03-02 NOTE — Progress Notes (Signed)
Subjective:    Patient ID: Angela MallingCarla M Churchill, female    DOB: 1986/04/12, 32 y.o.   MRN: [id number]  [name] is a 32 y.o. female presenting on 03/02/2018 for Back Pain (persistent lower back pain that radiates around the pelvic area x 1mth. Symptoms have worsen over the last week.)   HPI Lower Back Pain In past, has had intermittent back pain after her last pregnancy 2 years ago.  Last 2 children were close together - had epidural with the last pregnancy.  Pt also noted more general soreness in recovery with hips/back.  Increase in persistent back pain began 4 months ago, but was tolerable and manageable.  7 days ago, pt notes her son was in bed with her with his head on her back.  Pt reports she usually sleeps on stomach and son had head on her back as a pillow.  Now breathing is hurting, all movement is painful.  Pt is single mother of children ages 167, 254, 2.  Pain is limiting her ability to carry out her daily duties. - Has tried tylenol, ibuprofen, aleeve, heating pad.  Early in course of last month medications would help ease off pain, but would not provide full relief. Has taken these regularly every 4-6 hours as recommended. - Sharp pain with sensation of a ball inside her back os located at region of L1-L3 per pt report and pointing.   - Pain increased with cough/breathing, movement.  She also notes burning, radiating pain around lateral upper hips to groin. - Dad with DDD.  No other family history noted. 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  Denies heavy lifting. - Pt denies any changes or loss of control of bowel or bladder.  Social History   Tobacco Use  . Smoking status: Never Smoker  . Smokeless tobacco: Never Used  Substance Use Topics  . Alcohol use: No  . Drug use: No    Review of Systems Per HPI unless specifically indicated above     Objective:    BP 113/66 (BP Location: Right Arm, Patient Position: Sitting, Cuff Size: Small)   Pulse 84   Temp 98.2 F  (36.8 C) (Oral)   Ht 5\' 3"  (1.6 m)   Wt 126 lb (57.2 kg)   LMP 02/19/2018   BMI 22.32 kg/m   Wt Readings from Last 3 Encounters:  03/02/18 126 lb (57.2 kg)  02/15/18 125 lb 12.8 oz (57.1 kg)  12/25/17 120 lb 6.4 oz (54.6 kg)    Physical Exam  Constitutional: She is oriented to person, place, and time. She appears well-developed and well-nourished. No distress.  Neck: Normal range of motion. Neck supple. Carotid bruit is not present.  Cardiovascular: Normal rate, regular rhythm, S1 normal, S2 normal, normal heart sounds and intact distal pulses.  Pulmonary/Chest: Effort normal and breath sounds normal. No respiratory distress.  Musculoskeletal: She exhibits no edema (pedal).  Mid-Low Back Inspection: Normal appearance, but guarded.  No spinal deformity, symmetrical. Palpation: Tenderness over spinous processes at level of T6-S1.  L1-L3 is region with worst pain.  (Palpation induced pain that caused pt to start sweating).  Bilateral low thoracic and lumbar paraspinal muscles mildly tender and with hypertonicity/spasm. ROM: Severely limited AROM forward flex / back extension, rotation L/R 2/2 discomfort Special Testing: Seated SLR positive for radicular pain bilaterally and with reproduced localized midline back pain. Standing facet load test positive midline back pain Strength: Bilateral hip flex/ext 4/5, knee flex/ext 4/5, ankle dorsiflex/plantarflex 5/5  Neurovascular: intact distal sensation to light touch.  Neurological: She is alert and oriented to person, place, and time.  Skin: Skin is warm and dry.  Psychiatric: She has a normal mood and affect. Her behavior is normal.  Vitals reviewed.     Results for orders placed or performed in visit on 03/02/18  POCT Urinalysis Dipstick  Result Value Ref Range   Color, UA yellow    Clarity, UA clear    Glucose, UA neg    Bilirubin, UA neg    Ketones, UA neg    Spec Grav, UA 1.015 1.010 - 1.025   Blood, UA neg    pH, UA 5.0 5.0 -  8.0   Protein, UA neg    Urobilinogen, UA 0.2 0.2 or 1.0 E.U./dL   Nitrite, UA neg    Leukocytes, UA Negative Negative   Appearance clear    Odor negative       Assessment & Plan:   Problem List Items Addressed This Visit      Other   Anxiety and depression Pt notes she started having tremor and nausea on higher dose sertraline.  Self-reduced dose to 50 mg once daily and is continuing to do well.  NO side effects are noted.  Refill provided for lower dose.  Continue buspirone.   Relevant Medications   sertraline (ZOLOFT) 50 MG tablet    Other Visit Diagnoses    Acute low back pain without sciatica, unspecified back pain laterality    -  Primary   Relevant Medications   predniSONE (DELTASONE) 20 MG tablet   baclofen (LIORESAL) 10 MG tablet   Other Relevant Orders   POCT Urinalysis Dipstick (Completed)      Pain likely self-limited.  Muscle strain possible complicated by stomach sleeping and pressure directly applied to back.  Cannot exclude disc injury given pt symptoms, clinical exam.  Plan:  1. Treat with OTC pain meds (acetaminophen and ibuprofen).  Discussed alternate dosing and max dosing. - Avoid ibuprofen and Aleeve while taking prednisone - Start prednisone taper over 10 days. Take prednisone taper 20 mg tablets Day 1-4 take 3 pills at one time; Day 5-6: Take 2 pills; Day 7-8: Take 1 pills; Day 9-10: Take 1/2 pill; then stop. 2. Apply heat and/or ice to affected area. 3. May also apply a muscle rub with lidocaine or lidocaine patch after heat or ice. 4. Take muscle relaxer baclofen 5-10 mg up to three times daily.  Cautioned drowsiness. 5. Follow up 2-4 weeks.  Total duration of pain will be 6 weeks at that time.  Consider imaging studies if no improvement.   Meds ordered this encounter  Medications  . predniSONE (DELTASONE) 20 MG tablet    Sig: Day 1-4 take 3 pills once daily.  Day 5-6 take 2 pills.  Day 7-8 take 1 pill.  Day 9-10 take 1/2 pill.    Dispense:  19  tablet    Refill:  0    Order Specific Question:   Supervising Provider    Answer:   Smitty Cords [[id number]]  . baclofen (LIORESAL) 10 MG tablet    Sig: Take 0.5-1 tablets (5-10 mg total) by mouth 3 (three) times daily.    Dispense:  30 each    Refill:  0    Order Specific Question:   Supervising Provider    Answer:   Smitty Cords [[id number]]  . sertraline (ZOLOFT) 50 MG tablet    Sig: Take 1 tablet (50 mg total)  by mouth daily.    Dispense:  90 tablet    Refill:  1    Please cancel 100 mg tablets.  CHANGE back to 50 mg daily.    Order Specific Question:   Supervising Provider    Answer:   Smitty Cords [[id number]]    Follow up plan: Return 2-4 weeks if symptoms worsen or fail to improve.  [name], DNP, AGPCNP-BC Adult Gerontology Primary Care Nurse Practitioner [facility] 03/02/2018, 12:06 PM

## 2018-03-15 ENCOUNTER — Ambulatory Visit (INDEPENDENT_AMBULATORY_CARE_PROVIDER_SITE_OTHER): Payer: Medicaid Other | Admitting: Nurse Practitioner

## 2018-03-15 ENCOUNTER — Encounter: Payer: Self-pay | Admitting: Nurse Practitioner

## 2018-03-15 VITALS — BP 104/56 | HR 96 | Temp 98.6°F | Ht 63.0 in | Wt 130.2 lb

## 2018-03-15 DIAGNOSIS — N72 Inflammatory disease of cervix uteri: Secondary | ICD-10-CM

## 2018-03-15 DIAGNOSIS — Z124 Encounter for screening for malignant neoplasm of cervix: Secondary | ICD-10-CM | POA: Diagnosis not present

## 2018-03-15 DIAGNOSIS — Z0000 Encounter for general adult medical examination without abnormal findings: Secondary | ICD-10-CM | POA: Diagnosis not present

## 2018-03-15 DIAGNOSIS — Z7689 Persons encountering health services in other specified circumstances: Secondary | ICD-10-CM

## 2018-03-15 LAB — POCT WET PREP (WET MOUNT)
Clue Cells Wet Prep Whiff POC: NEGATIVE
Trichomonas Wet Prep HPF POC: ABSENT

## 2018-03-15 NOTE — Progress Notes (Signed)
Subjective:    Patient ID: [name], female    DOB: Apr 16, 1986, 32 y.o.   MRN: [id number]  [name] is a 32 y.o. female presenting on 03/15/2018 for Annual Exam and Anxiety   HPI Annual Physical Exam Patient has been feeling mostly well with improving back pain since last visit.  They have no other acute concerns today. Sleeps 7-8 hours per night uninterrupted.  HEALTH MAINTENANCE: Weight/BMI: increased 10 lbs in last 2 months Physical activity: Was doing regular exercise program prior to back pain. Diet: Always eats breakfast and dinner (5:30-6).  Occasional snack.  Late night snacks are more frequent. Seatbelt: always Sunscreen: regular,  PAP: due - notes copious yellow discharge --> November (6 months ago) was sexually active. HIV/Hep C: desires both today - former partner has Hep C Optometry: approx 1 year Dentistry: appointment Monday  VACCINES: Tetanus: 2016  Gardasil: declines   Anxiety Pt stopped taking both sertraline and buspirone.  Is reporting tolerable symptoms and is not currently having difficulty with completing tasks, difficulty with interacting with other people.  Denies SI/HI and plans.  Past Medical History:  Diagnosis Date  . Anxiety   . Anxiety and depression   . Bleeding nose   . Depression   . 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 Mahoney today for follow up regarding left inguinal adenopathy with history of right caseating granulomatous lymphadenitis with resection in 2013. She saw Ermalene Searing in our department two weeks ago. Her case was discussed with Dr Noel Gerold with [facility] ID and  differential diagnosis includes non-infectious disease process (ex. Sarcoidosis, malignancy), infectious disease (ex. viral or bacterial, including TB), fungal infection (ex. Blastomycosis, coccidioidomycosis, histoplasmosis). She was scheduled to see surgical oncology for a possible biopsy, but missed that appointment. She has  appt with Dr. Rosemarie Beath on 03/26/14.  We reviewed the results of her evaluation thus far:  03/03/14 CXR wnl 03/03/14 quantiferon TB test neg 03/03/14 CBC WBC 14 (wnl for pregnancy) 03/03/14 fungal antibodies negative  03/03/14 sed rate 28 and CRP 1.9 (mildly elevated although consistent with pregnancy)  She does report a dramatic improvement in the size of the left lymph node since completing a course  . Headache, tension-type   . Medical history non-contributory   . Need for Tdap vaccination    at 28 weeks   Past Surgical History:  Procedure Laterality Date  . LAPAROSCOPY  2010  . LYMPHADENECTOMY Right    "size of a baseball"   Social History   Socioeconomic History  . Marital status: Single    Spouse name: Not on file  . Number of children: Not on file  . Years of education: Not on file  . Highest education level: Not on file  Occupational History  . Not on file  Social Needs  . Financial resource strain: Not on file  . Food insecurity:    Worry: Not on file    Inability: Not on file  . Transportation needs:    Medical: Not on file    Non-medical: Not on file  Tobacco Use  . Smoking status: Never Smoker  . Smokeless tobacco: Never Used  Substance and Sexual Activity  . Alcohol use: No  . Drug use: No  . Sexual activity: Never  Lifestyle  . Physical activity:    Days per week: Not on file    Minutes per session: Not on file  . Stress: Not on file  Relationships  .  Social connections:    Talks on phone: Not on file    Gets together: Not on file    Attends religious service: Not on file    Active member of club or organization: Not on file    Attends meetings of clubs or organizations: Not on file    Relationship status: Not on file  . Intimate partner violence:    Fear of current or ex partner: Not on file    Emotionally abused: Not on file    Physically abused: Not on file    Forced sexual activity: Not on file  Other Topics Concern  . Not on file  Social History  Narrative  . Not on file   Family History  Problem Relation Age of Onset  . Diabetes Father   . Diabetes Maternal Grandfather   . Ovarian cancer Paternal Grandmother   . Diabetes Paternal Grandfather   . Breast cancer Neg Hx   . Colon cancer Neg Hx   . Heart disease Neg Hx    Current Outpatient Medications on File Prior to Visit  Medication Sig  . baclofen (LIORESAL) 10 MG tablet Take 0.5-1 tablets (5-10 mg total) by mouth 3 (three) times daily.  Marland Kitchen loratadine (CLARITIN) 10 MG tablet Take 10 mg by mouth daily.  . Norgestimate-Ethinyl Estradiol Triphasic 0.18/0.215/0.25 MG-25 MCG tab Take 1 tablet by mouth daily.  . busPIRone (BUSPAR) 7.5 MG tablet Take 1 tablet (7.5 mg total) by mouth 2 (two) times daily. (Patient not taking: Reported on 03/15/2018)  . sertraline (ZOLOFT) 50 MG tablet Take 1 tablet (50 mg total) by mouth daily. (Patient not taking: Reported on 03/15/2018)   No current facility-administered medications on file prior to visit.     Review of Systems  Constitutional: Negative for chills and fever.  HENT: Negative for congestion and sore throat.   Eyes: Negative for pain.  Respiratory: Negative for cough, shortness of breath and wheezing.   Cardiovascular: Negative for chest pain, palpitations and leg swelling.  Gastrointestinal: Negative for abdominal pain, blood in stool, constipation, diarrhea, nausea and vomiting.  Endocrine: Negative for polydipsia.  Genitourinary: Negative for dysuria, frequency, hematuria and urgency.  Musculoskeletal: Negative for back pain, myalgias and neck pain.  Skin: Negative.  Negative for rash.  Allergic/Immunologic: Negative for environmental allergies.  Neurological: Negative for dizziness, weakness and headaches.  Hematological: Does not bruise/bleed easily.  Psychiatric/Behavioral: Negative for dysphoric mood and suicidal ideas. The patient is not nervous/anxious.    Per HPI unless specifically indicated above     Objective:      BP (!) 104/56 (BP Location: Right Arm, Patient Position: Sitting, Cuff Size: Normal)   Pulse 96   Temp 98.6 F (37 C) (Oral)   Ht 5\' 3"  (1.6 m)   Wt 130 lb 3.2 oz (59.1 kg)   LMP 02/19/2018   BMI 23.06 kg/m   Wt Readings from Last 3 Encounters:  03/15/18 130 lb 3.2 oz (59.1 kg)  03/02/18 126 lb (57.2 kg)  02/15/18 125 lb 12.8 oz (57.1 kg)    Physical Exam  Constitutional: She is oriented to person, place, and time. She appears well-developed and well-nourished. No distress.  HENT:  Head: Normocephalic and atraumatic.  Right Ear: External ear normal.  Left Ear: External ear normal.  Nose: Nose normal.  Mouth/Throat: Oropharynx is clear and moist.  Eyes: Pupils are equal, round, and reactive to light. Conjunctivae are normal.  Neck: Normal range of motion. Neck supple. No JVD present. No tracheal deviation  present. No thyromegaly present.  Cardiovascular: Normal rate, regular rhythm, normal heart sounds and intact distal pulses. Exam reveals no gallop and no friction rub.  No murmur heard. Pulmonary/Chest: Effort normal and breath sounds normal. No respiratory distress.  Breast - Normal exam w/ symmetric breasts, no mass, no nipple discharge, no skin changes or tenderness.  Abdominal: Soft. Bowel sounds are normal. She exhibits no distension. There is no tenderness.  Genitourinary:  Genitourinary Comments: Normal external female genitalia without lesions or fusion. Vaginal canal without lesions. Strawberry red appearing cervix with mild friability. Thick, yellow discharge on exam. Bimanual exam without adnexal masses, enlarged uterus.  Positive for cervical motion tenderness.  Musculoskeletal: Normal range of motion.  Lymphadenopathy:    She has no cervical adenopathy.  Neurological: She is alert and oriented to person, place, and time. No cranial nerve deficit.  Skin: Skin is warm and dry.  Psychiatric: She has a normal mood and affect. Her behavior is normal. Judgment and  thought content normal.  Nursing note and vitals reviewed.    Results for orders placed or performed in visit on 03/02/18  POCT Urinalysis Dipstick  Result Value Ref Range   Color, UA yellow    Clarity, UA clear    Glucose, UA neg    Bilirubin, UA neg    Ketones, UA neg    Spec Grav, UA 1.015 1.010 - 1.025   Blood, UA neg    pH, UA 5.0 5.0 - 8.0   Protein, UA neg    Urobilinogen, UA 0.2 0.2 or 1.0 E.U./dL   Nitrite, UA neg    Leukocytes, UA Negative Negative   Appearance clear    Odor negative       Assessment & Plan:   Problem List Items Addressed This Visit    None    Visit Diagnoses    Encounter for annual physical exam    -  Primary   Relevant Orders   HIV antibody (with reflex)   Hepatitis C antibody   RPR   C. trachomatis/N. gonorrhoeae RNA (Completed)   CBC with Differential/Platelet   TSH   Lipid panel   COMPLETE METABOLIC PANEL WITH GFR   POCT Wet Prep (Wet [place])   Pap IG and HPV (high risk) DNA detection (Completed)   Encounter for assessment of STD exposure       Relevant Orders   HIV antibody (with reflex)   Hepatitis C antibody   RPR   C. trachomatis/N. gonorrhoeae RNA (Completed)   POCT Wet Prep (Wet [place])   Cervical cancer screening       Relevant Orders   Pap IG and HPV (high risk) DNA detection (Completed)   Cervicitis       Relevant Orders   Trichomonas vaginalis, RNA    #1 Physical exam with new findings of cervicitis.  Well adult with no acute concerns.  Plan: 1. Obtain health maintenance screenings as above according to age. - Increase physical activity to 30 minutes most days of the week.  - Eat healthy diet high in vegetables and fruits; low in refined carbohydrates. 2. Return 1 year for annual physical.   #2 Cervicitis: Awaiting STD testing.  Treat empirically with azithromycin and flagyl.   Interval: negative STD testing, but clinically high suspicion for STD. Followup if any recurrent pelvic pain or discharge.  Meds  ordered this encounter  Medications  . azithromycin (ZITHROMAX) 250 MG tablet    Sig: Take 4 tablets (1,000 mg total) by mouth once  for 1 dose.    Dispense:  4 each    Refill:  0    Order Specific Question:   Supervising Provider    Answer:   Smitty Cords [[id number]]  . metroNIDAZOLE (METROGEL) 0.75 % vaginal gel    Sig: Place 1 Applicatorful vaginally at bedtime for 5 days.    Dispense:  70 g    Refill:  0    Order Specific Question:   Supervising Provider    Answer:   Smitty Cords [[id number]]    Follow up plan: Return in about 1 year (around 03/16/2019) for annual physical AND in 6 weeks for anxiety as needed.  [name], DNP, AGPCNP-BC Adult Gerontology Primary Care Nurse Practitioner [facility] 03/15/2018, 2:50 PM

## 2018-03-15 NOTE — Patient Instructions (Addendum)
[name],   Thank you for coming in to clinic today.  1. Increase your physical activity until you are increasing your heart rate for 30 minutes on most days of the week.  2. Continue eating a healthy diet.  3. Continue buspirone.  You may stop sertraline.  We will meet again for anxiety management as needed in 6 weeks.  It can take up to 6-8 weeks for sertraline to completely stop working.  Most likely, this is why you have been having worsening symptoms.  You will be due for FASTING BLOOD WORK.  This means you should eat no food or drink after midnight.  Drink only water or coffee without cream/sugar on the morning of your lab visit. - Please go ahead and schedule a "Lab Only" visit in the morning at the clinic for lab draw in the next 7 days. - Your results will be available about 2-3 days after blood draw.  If you have set up a MyChart account, you can can log in to MyChart online to view your results and a brief explanation. Also, we can discuss your results together at your next office visit if you would like.   Please schedule a follow-up appointment with Wilhelmina McardleLauren Shelonda Saxe, AGNP. Return in about 1 year (around 03/16/2019) for annual physical AND in 6 weeks for anxiety as needed.  If you have any other questions or concerns, please feel free to call the clinic or send a message through MyChart. You may also schedule an earlier appointment if necessary.  You will receive a survey after today's visit either digitally by e-mail or paper by [employer] mail. Your experiences and feedback matter to us.  Please respond so we know how we are doing as we provide care for you.   Wilhelmina McardleLauren Elizebeth Kluesner, DNP, AGNP-BC Adult Gerontology Nurse Practitioner [facility]  Low Back Pain Exercises See other page with pictures of each exercise.  Start with 1 or 2 of these exercises that you are most comfortable with. Do not do any exercises that cause you significant worsening pain. Some of  these may cause some "stretching soreness" but it should go away after you stop the exercise, and get better over time. Gradually increase up to 3-4 exercises as tolerated.  Standing hamstring stretch: Place the heel of your leg on a stool about 15 inches high. Keep your knee straight. Lean forward, bending at the hips until you feel a mild stretch in the back of your thigh. Make sure you do not roll your shoulders and bend at the waist when doing this or you will stretch your lower back instead. Hold the stretch for 15 to 30 seconds. Repeat 3 times. Repeat the same stretch on your other leg.  Cat and camel: Get down on your hands and knees. Let your stomach sag, allowing your back to curve downward. Hold this position for 5 seconds. Then arch your back and hold for 5 seconds. Do 3 sets of 10.  Quadriped Arm/Leg Raises: Get down on your hands and knees. Tighten your abdominal muscles to stiffen your spine. While keeping your abdominals tight, raise one arm and the opposite leg away from you. Hold this position for 5 seconds. Lower your arm and leg slowly and alternate sides. Do this 10 times on each side.  Pelvic tilt: Lie on your back with your knees bent and your feet flat on the floor. Tighten your abdominal muscles and push your lower back into the floor. Hold  this position for 5 seconds, then relax. Do 3 sets of 10.  Partial curl: Lie on your back with your knees bent and your feet flat on the floor. Tighten your stomach muscles and flatten your back against the floor. Tuck your chin to your chest. With your hands stretched out in front of you, curl your upper body forward until your shoulders clear the floor. Hold this position for 3 seconds. Don't hold your breath. It helps to breathe out as you lift your shoulders up. Relax. Repeat 10 times. Build to 3 sets of 10. To challenge yourself, clasp your hands behind your head and keep your elbows out to the side.  Lower trunk rotation: Lie on your  back with your knees bent and your feet flat on the floor. Tighten your abdominal muscles and push your lower back into the floor. Keeping your shoulders down flat, gently rotate your legs to one side, then the other as far as you can. Repeat 10 to 20 times.  Single knee to chest stretch: Lie on your back with your legs straight out in front of you. Bring one knee up to your chest and grasp the back of your thigh. Pull your knee toward your chest, stretching your buttock muscle. Hold this position for 15 to 30 seconds and return to the starting position. Repeat 3 times on each side.  Double knee to chest: Lie on your back with your knees bent and your feet flat on the floor. Tighten your abdominal muscles and push your lower back into the floor. Pull both knees up to your chest. Hold for 5 seconds and repeat 10 to 20 times.

## 2018-03-16 ENCOUNTER — Telehealth: Payer: Self-pay

## 2018-03-16 LAB — PAP IG AND HPV HIGH-RISK: HPV DNA High Risk: NOT DETECTED

## 2018-03-16 LAB — C. TRACHOMATIS/N. GONORRHOEAE RNA
C. trachomatis RNA, TMA: NOT DETECTED
N. gonorrhoeae RNA, TMA: NOT DETECTED

## 2018-03-16 LAB — TEST AUTHORIZATION

## 2018-03-16 NOTE — Telephone Encounter (Signed)
I called and spoke w/ Marcelino DusterMichelle from [facility] and the Trichomonas test was added on to the patient previous lab order.

## 2018-03-16 NOTE — Telephone Encounter (Signed)
-----   Message from Galen ManilaLauren Renee Kennedy, NP sent at 03/16/2018 12:37 PM EDT ----- Please call to add on NAAT for Trichomonas testing.  Sometimes wet prep is negative even though pt may still have trichomonas infection. - Negative Chlamydia and Gonorrhea

## 2018-03-19 LAB — TRICHOMONAS VAGINALIS, PROBE AMP: Trichomonas vaginalis RNA: NOT DETECTED

## 2018-03-19 MED ORDER — METRONIDAZOLE 0.75 % VA GEL: 1.0000 | Freq: Every day | VAGINAL | 0 refills | Status: AC

## 2018-03-19 MED ORDER — AZITHROMYCIN 250 MG PO TABS: 1000.0000 mg | ORAL_TABLET | Freq: Once | ORAL | 0 refills | Status: AC

## 2018-03-22 ENCOUNTER — Telehealth: Payer: Self-pay | Admitting: Nurse Practitioner

## 2018-03-22 NOTE — Telephone Encounter (Signed)
Pt thinks she is having a reaction to antibiotic.  She has a rash on arms [phone]

## 2018-03-23 NOTE — Telephone Encounter (Signed)
Covering inbox for [name], AGPCNP-BC while she is out of office.  Called patient back. Reviewed her concern with rash. Reviewed chart. She was treated with Azithromycing 1g x 1 dose on 4/29 for empiric cover of cervicitis. She states rash has not worsened but unsure if improving, no other associated symptoms, it is now day 4 after antibiotic, it was only a one time dose. She has had Z-pak before without problem. Denies any throat swelling, nausea vomiting, states antibiotic did not come back up or have any vomiting after taking the 1g.  She has been on buspar for longer and did not have problem before.  I advised her that I am unsure if this is exactly related. Possibly it could be due to the high dose 1g at one time. Otherwise I am reassured at this point, and would recommend monitor symptoms for now if dramatic change or spreading or worse or new symptoms then can seek treatment over weekend or contact us by next week. Otherwise limited options now since she is already off the antibiotic. Defer other therapy such as prednisone etc for now.  Saralyn Pilar, DO [facility] 03/23/2018, 4:53 PM

## 2018-03-23 NOTE — Telephone Encounter (Signed)
Spoke to patient she is not sure if allergic reaction is from Z-pack or Buspar. She had used Z Pack in past without getting any side effect and with Buspar a little rash. Patient had stopped using both med's for now but still has rash on her arm not spreading. Call back number is [phone].

## 2018-04-12 ENCOUNTER — Other Ambulatory Visit: Payer: Self-pay | Admitting: Nurse Practitioner

## 2018-04-12 DIAGNOSIS — Z3041 Encounter for surveillance of contraceptive pills: Secondary | ICD-10-CM

## 2018-06-14 ENCOUNTER — Ambulatory Visit (INDEPENDENT_AMBULATORY_CARE_PROVIDER_SITE_OTHER): Payer: Medicaid Other | Admitting: Nurse Practitioner

## 2018-06-14 VITALS — BP 114/53 | HR 86 | Temp 98.6°F | Wt 133.0 lb

## 2018-06-14 DIAGNOSIS — N946 Dysmenorrhea, unspecified: Secondary | ICD-10-CM | POA: Diagnosis not present

## 2018-06-14 DIAGNOSIS — F419 Anxiety disorder, unspecified: Secondary | ICD-10-CM | POA: Diagnosis not present

## 2018-06-14 DIAGNOSIS — N809 Endometriosis, unspecified: Secondary | ICD-10-CM

## 2018-06-14 DIAGNOSIS — F32A Depression, unspecified: Secondary | ICD-10-CM

## 2018-06-14 DIAGNOSIS — F329 Major depressive disorder, single episode, unspecified: Secondary | ICD-10-CM

## 2018-06-15 MED ORDER — BUPROPION HCL ER (XL) 150 MG PO TB24
150.0000 mg | ORAL_TABLET | Freq: Every day | ORAL | 2 refills | Status: DC
Start: 2018-06-15 — End: 2018-10-24

## 2018-06-15 MED ORDER — NORETHINDRONE ACET-ETHINYL EST 1-20 MG-MCG PO TABS: 1.0000 | ORAL_TABLET | Freq: Every day | ORAL | 12 refills | Status: DC

## 2018-06-15 NOTE — Progress Notes (Signed)
Subjective:    Patient ID: [name], female    DOB: 1985-12-08, 32 y.o.   MRN: [id number]  [name] is a 32 y.o. female presenting on 06/14/2018 for Anxiety (Worsening anxiety and depression currently off medications) and Contraception (Cramping, heavy bleeding with current OCP)   HPI Anxiety Patient continues to have increasing anxiety because she has been off of medications since her last visit.  She was previously taking Wellbutrin XL 150 mg once daily.  She would like to resume this medication.  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  States she does not feel that she is taking her stress home.  GAD 7 : Generalized Anxiety Score 06/15/2018 02/15/2018 12/25/2017 12/28/2015  Nervous, Anxious, on Edge 2 1 3 1   Control/stop worrying 3 1 3  0  Worry too much - different things 3 1 3 1   Trouble relaxing 2 3 3 1   Restless 1 0 1 0  Easily annoyed or irritable 3 3 3 1   Afraid - awful might happen 1 0 1 0  Total GAD 7 Score 15 9 17 4   Anxiety Difficulty Very difficult Very difficult Somewhat difficult Not difficult at all   Depression screen [facility] 2/9 06/15/2018 03/15/2018 02/15/2018 12/25/2017 12/28/2015  Decreased Interest 2 2 1 3  0  Down, Depressed, Hopeless 2 1 1 3  0  PHQ - 2 Score 4 3 2 6  0  Altered sleeping 2 1 1 3  0  Tired, decreased energy 3 2 3 3 1   Change in appetite 0 1 0 1 0  Feeling bad or failure about yourself  2 0 0 1 0  Trouble concentrating 1 3 3 3  0  Moving slowly or fidgety/restless 0 0 0 1 0  Suicidal thoughts 0 0 0 1 -  PHQ-9 Score 12 10 9 19 1   Difficult doing work/chores Very difficult Not difficult at all Very difficult Somewhat difficult Not difficult at all    Contraception Patient also wants to discuss changing her birth control pills because she is having severe cramps on her current medication.  She is taking Tri-Sprintec currently.  LMP:  06/12/2018 patient has a known history of endometriosis and has previously seen GYN Dr. Feliberto Gottron.  Social History   Tobacco Use  . Smoking status: Never Smoker  . Smokeless tobacco: Never Used  Substance Use Topics  . Alcohol use: No  . Drug use: No    Review of Systems Per HPI unless specifically indicated above     Objective:    BP (!) 114/53   Pulse 86   Temp 98.6 F (37 C)   Wt 133 lb (60.3 kg)   BMI 23.56 kg/m   Wt Readings from Last 3 Encounters:  08/16/18 130 lb (59 kg)  07/25/18 130 lb 12.8 oz (59.3 kg)  06/14/18 133 lb (60.3 kg)    Physical Exam  Constitutional: She is oriented to person, place, and time. She appears well-developed and well-nourished. No distress.  HENT:  Head: Normocephalic and atraumatic.  Cardiovascular: Normal rate, regular rhythm, S1 normal, S2 normal, normal heart sounds and intact distal pulses.  Pulmonary/Chest: Effort normal and breath sounds normal. No respiratory distress.  Abdominal: Soft. Bowel sounds are normal. She exhibits no distension. There is no hepatosplenomegaly. There is no tenderness. No hernia.  Neurological: She is alert and oriented to person, place, and time.  Skin: Skin is warm and dry.  Psychiatric: Her behavior is normal. Her mood appears anxious.  Vitals reviewed.   Results for orders placed or performed in visit on 03/15/18  C. trachomatis/N. gonorrhoeae RNA  Result Value Ref Range   C. trachomatis RNA, TMA NOT DETECTED NOT DETECT   N. gonorrhoeae RNA, TMA NOT DETECTED NOT DETECT  Trichomonas vaginalis, RNA  Result Value Ref Range   Trichomonas vaginalis RNA NOT DETECTED NOT DETECT  TEST AUTHORIZATION  Result Value Ref Range   TEST NAME: SURESWAB(R) TRICHOMONAS    TEST CODE: [id number]    CLIENT CONTACT: KELITA RUSSELL    REPORT ALWAYS MESSAGE SIGNATURE    POCT Wet Prep [employer](Wet Mount)  Result Value Ref Range   Source Wet Prep POC vagina    WBC, Wet Prep HPF POC moderate    Bacteria Wet Prep HPF POC  Moderate (A) Few   BACTERIA WET PREP MORPHOLOGY POC     Clue Cells Wet Prep HPF POC None None   Clue Cells Wet Prep Whiff POC Negative Whiff    Yeast Wet Prep HPF POC None None   KOH Wet Prep POC None None   Trichomonas Wet Prep HPF POC Absent Absent  Pap IG and HPV (high risk) DNA detection  Result Value Ref Range   Clinical Information:     LMP:     PREV. PAP:     PREV. BX:     HPV DNA Probe-Source     STATEMENT OF ADEQUACY:     INTERPRETATION/RESULT:     Comment:     CYTOTECHNOLOGIST:     HPV DNA High Risk Not Detected Not Detect      Assessment & Plan:   Problem List Items Addressed This Visit      Other   Anxiety and depression Worsening anxiety and depression off medications.  Patient desires to resume meds.  Plan: 1.  Start Wellbutrin XL 150 mg once daily 2.  Discussed nonhormonal options for treating depression 3.  Follow-up 6 weeks   Relevant Medications   buPROPion (WELLBUTRIN XL) 150 MG 24 hr tablet    Other Visit Diagnoses    Dysmenorrhea    -  Primary   Endometriosis     Patient with history of dysmenorrhea and endometriosis.  No improvement on tri phasic OCP.  Plan: 1.  Start Loestrin 1-20 mg-micrograms tablet once daily. 2.  Consider repeat visit with OB/GYN 3.  Follow-up as needed.      Meds ordered this encounter  Medications  . norethindrone-ethinyl estradiol (LOESTRIN 1/20, 21,) 1-20 MG-MCG tablet    Sig: Take 1 tablet by mouth daily.    Dispense:  1 Package    Refill:  12    Order Specific Question:   Supervising Provider    Answer:   Smitty CordsKARAMALEGOS, ALEXANDER J [[id number]]  . buPROPion (WELLBUTRIN XL) 150 MG 24 hr tablet    Sig: Take 1 tablet (150 mg total) by mouth daily.    Dispense:  30 tablet    Refill:  2    Order Specific Question:   Supervising Provider    Answer:   Smitty CordsKARAMALEGOS, ALEXANDER J [[id number]]    Follow up plan: Return in about 6 weeks (around 07/26/2018) for anxiety, diabetes.  Wilhelmina McardleLauren Pat Sires, DNP, AGPCNP-BC Adult Gerontology  Primary Care Nurse Practitioner [facility] 06/15/2018, 9:30 AM

## 2018-06-15 NOTE — Patient Instructions (Addendum)
[name],   Thank you for coming in to clinic today.  1.  Change contraception pill to Loestrin FE 1-20.  Take 1 tablet daily.  This is a standard consistent dose of estrogen throughout her cycle.  This may reduce your heavy bleeding and cramping with periods.  If not, follow-up with me at [facility] or with your OB/GYN. -Allow 2 full cycles of medication prior to deciding its effect on your periods.  2. For your anxiety and depression: -He will likely need to be on medication for extended period of time recurrence in the future.  The patient with the process to find the right medication for you. -Start Wellbutrin XL 150 mg tablet once daily. -Continue adequate stress management strategies especially with work stressors.   Please schedule a follow-up appointment with [name], AGNP. Return in about 6 weeks (around 07/26/2018) for anxiety, diabetes.  If you have any other questions or concerns, please feel free to call the clinic or send a message through MyChart. You may also schedule an earlier appointment if necessary.  You will receive a survey after today's visit either digitally by e-mail or paper by [employer] mail. Your experiences and feedback matter to us.  Please respond so we know how we are doing as we provide care for you.   Angela McardleLauren Treyshaun Keatts, DNP, AGNP-BC Adult Gerontology Nurse Practitioner [facility], [facility]

## 2018-06-22 DIAGNOSIS — Z7689 Persons encountering health services in other specified circumstances: Secondary | ICD-10-CM | POA: Diagnosis not present

## 2018-06-22 DIAGNOSIS — Z0000 Encounter for general adult medical examination without abnormal findings: Secondary | ICD-10-CM | POA: Diagnosis not present

## 2018-06-25 LAB — COMPLETE METABOLIC PANEL WITH GFR
AG Ratio: 1.8 (calc) (ref 1.0–2.5)
ALT: 16 U/L (ref 6–29)
AST: 16 U/L (ref 10–30)
Albumin: 4.2 g/dL (ref 3.6–5.1)
Alkaline phosphatase (APISO): 62 U/L (ref 33–115)
BUN: 12 mg/dL (ref 7–25)
CO2: 27 mmol/L (ref 20–32)
Calcium: 9.4 mg/dL (ref 8.6–10.2)
Chloride: 104 mmol/L (ref 98–110)
Creat: 0.65 mg/dL (ref 0.50–1.10)
GFR, Est African American: 138 mL/min/{1.73_m2} (ref >=60)
GFR, Est Non African American: 119 mL/min/{1.73_m2} (ref >=60)
Globulin: 2.4 g/dL (calc) (ref 1.9–3.7)
Glucose, Bld: 78 mg/dL (ref 65–99)
Potassium: 3.7 mmol/L (ref 3.5–5.3)
Sodium: 140 mmol/L (ref 135–146)
Total Bilirubin: 0.8 mg/dL (ref 0.2–1.2)
Total Protein: 6.6 g/dL (ref 6.1–8.1)

## 2018-06-25 LAB — CBC WITH DIFFERENTIAL/PLATELET
Basophils Absolute: 30 cells/uL (ref 0–200)
Basophils Relative: 0.6 %
Eosinophils Absolute: 100 cells/uL (ref 15–500)
Eosinophils Relative: 2 %
HCT: 35.6 % (ref 35.0–45.0)
Hemoglobin: 12.4 g/dL (ref 11.7–15.5)
Lymphs Abs: 1720 cells/uL (ref 850–3900)
MCH: 31.8 pg (ref 27.0–33.0)
MCHC: 34.8 g/dL (ref 32.0–36.0)
MCV: 91.3 fL (ref 80.0–100.0)
MPV: 10.2 fL (ref 7.5–12.5)
Monocytes Relative: 6.6 %
Neutro Abs: 2820 cells/uL (ref 1500–7800)
Neutrophils Relative %: 56.4 %
Platelets: 254 10*3/uL (ref 140–400)
RBC: 3.9 10*6/uL (ref 3.80–5.10)
RDW: 11.8 % (ref 11.0–15.0)
Total Lymphocyte: 34.4 %
WBC mixed population: 330 cells/uL (ref 200–950)
WBC: 5 10*3/uL (ref 3.8–10.8)

## 2018-06-25 LAB — LIPID PANEL
Cholesterol: 168 mg/dL (ref <200)
HDL: 48 mg/dL — ABNORMAL LOW (ref >50)
LDL Cholesterol (Calc): 100 mg/dL (calc) — ABNORMAL HIGH
Non-HDL Cholesterol (Calc): 120 mg/dL (calc) (ref <130)
Total CHOL/HDL Ratio: 3.5 (calc) (ref <5.0)
Triglycerides: 107 mg/dL (ref <150)

## 2018-06-25 LAB — TSH: TSH: 0.52 mIU/L

## 2018-06-25 LAB — HEPATITIS C ANTIBODY
Hepatitis C Ab: NONREACTIVE
SIGNAL TO CUT-OFF: 0.01 (ref <1.00)

## 2018-06-25 LAB — RPR: RPR Ser Ql: NONREACTIVE

## 2018-06-25 LAB — HIV ANTIBODY (ROUTINE TESTING W REFLEX): HIV 1&2 Ab, 4th Generation: NONREACTIVE

## 2018-07-25 ENCOUNTER — Other Ambulatory Visit: Payer: Self-pay

## 2018-07-25 ENCOUNTER — Encounter: Payer: Self-pay | Admitting: Nurse Practitioner

## 2018-07-25 ENCOUNTER — Ambulatory Visit: Payer: Medicaid Other | Admitting: Nurse Practitioner

## 2018-07-25 VITALS — BP 112/64 | HR 98 | Temp 98.6°F | Resp 16 | Ht 63.0 in | Wt 130.8 lb

## 2018-07-25 DIAGNOSIS — R0602 Shortness of breath: Secondary | ICD-10-CM

## 2018-07-25 MED ORDER — ALBUTEROL SULFATE HFA 108 (90 BASE) MCG/ACT IN AERS: 1.0000 | INHALATION_SPRAY | Freq: Four times a day (QID) | RESPIRATORY_TRACT | 2 refills | Status: DC | PRN

## 2018-07-25 MED ORDER — MONTELUKAST SODIUM 10 MG PO TABS: 10.0000 mg | ORAL_TABLET | Freq: Every day | ORAL | 3 refills | Status: DC

## 2018-07-25 NOTE — Progress Notes (Signed)
Subjective:    Patient ID: [name], female    DOB: 03-19-1986, 32 y.o.   MRN: [id number]  [name] is a 32 y.o. female presenting on 07/25/2018 for Shortness of Breath (intermittent SOB x 4 in the past two weeks. felt like her throat closing up. Symptoms resolved after about 15 minutes of a breathing treatment and taking allergy medication.)   HPI Shortness of Breath Has had symptoms of shortness of breath for "a while"  Last week woke up and "couldn't breathe." Felt like her throat had closed up.  Drank water, took allergy med.  Has happened 3 other times over the past 7 days, including last night which was "really bad."  These happen in varying locations within her home and at different times of the day.    During these events, patient notes breathing became difficult.  Air was coming in, but not much.  She also noted wheezing.  When she was getting good air, would also gag and vomit. - Patient took allergy pill, Zyrtec which helps. - Has family history of late onset asthma. - Has no new products or any known allergy triggers for her current symptoms.  Social History   Tobacco Use  . Smoking status: Never Smoker  . Smokeless tobacco: Never Used  Substance Use Topics  . Alcohol use: No  . Drug use: No    Review of Systems Per HPI unless specifically indicated above     Objective:    BP 112/64 (BP Location: Right Arm, Patient Position: Sitting, Cuff Size: Normal)   Pulse 98   Temp 98.6 F (37 C) (Oral)   Resp 16   Ht 5\' 3"  (1.6 m)   Wt 130 lb 12.8 oz (59.3 kg)   SpO2 100%   BMI 23.17 kg/m   Wt Readings from Last 3 Encounters:  07/25/18 130 lb 12.8 oz (59.3 kg)  03/15/18 130 lb 3.2 oz (59.1 kg)  03/02/18 126 lb (57.2 kg)    Physical Exam  Constitutional: She is oriented to person, place, and time. She appears well-developed and well-nourished. No distress.  HENT:  Head: Normocephalic and atraumatic.  Right Ear: Hearing, tympanic membrane, external ear  and ear canal normal.  Left Ear: Hearing, tympanic membrane, external ear and ear canal normal.  Nose: Nose normal. Right sinus exhibits no maxillary sinus tenderness and no frontal sinus tenderness. Left sinus exhibits no maxillary sinus tenderness and no frontal sinus tenderness.  Mouth/Throat: Uvula is midline, oropharynx is clear and moist and mucous membranes are normal. Tonsils are 0 on the right. Tonsils are 0 on the left.  Neck: Normal range of motion. Neck supple.  Cardiovascular: Normal rate, regular rhythm, S1 normal, S2 normal, normal heart sounds and intact distal pulses.  Pulmonary/Chest: Effort normal and breath sounds normal. No stridor. No respiratory distress. She has no decreased breath sounds. She has no wheezes. She has no rhonchi. She has no rales.  Neurological: She is alert and oriented to person, place, and time.  Skin: Skin is warm and dry. Capillary refill takes less than 2 seconds. No rash noted.  Psychiatric: She has a normal mood and affect. Her behavior is normal. Judgment and thought content normal.  Vitals reviewed.   Results for orders placed or performed in visit on 03/15/18  C. trachomatis/N. gonorrhoeae RNA  Result Value Ref Range   C. trachomatis RNA, TMA NOT DETECTED NOT DETECT   N. gonorrhoeae RNA, TMA NOT DETECTED NOT DETECT  Trichomonas vaginalis,  RNA  Result Value Ref Range   Trichomonas vaginalis RNA NOT DETECTED NOT DETECT  HIV antibody (with reflex)  Result Value Ref Range   HIV 1&2 Ab, 4th Generation NON-REACTIVE NON-REACTI  Hepatitis C antibody  Result Value Ref Range   Hepatitis C Ab NON-REACTIVE NON-REACTI   SIGNAL TO CUT-OFF 0.01 <1.00  RPR  Result Value Ref Range   RPR Ser Ql NON-REACTIVE NON-REACTI  CBC with Differential/Platelet  Result Value Ref Range   WBC 5.0 3.8 - 10.8 Thousand/uL   RBC 3.90 3.80 - 5.10 Million/uL   Hemoglobin 12.4 11.7 - 15.5 g/dL   HCT 45.4 09.8 - 11.9 %   MCV 91.3 80.0 - 100.0 fL   MCH 31.8 27.0 - 33.0  pg   MCHC 34.8 32.0 - 36.0 g/dL   RDW 14.7 82.9 - 56.2 %   Platelets 254 140 - 400 Thousand/uL   MPV 10.2 7.5 - 12.5 fL   Neutro Abs 2,820 1,500 - 7,800 cells/uL   Lymphs Abs 1,720 850 - 3,900 cells/uL   WBC mixed population 330 200 - 950 cells/uL   Eosinophils Absolute 100 15 - 500 cells/uL   Basophils Absolute 30 0 - 200 cells/uL   Neutrophils Relative % 56.4 %   Total Lymphocyte 34.4 %   Monocytes Relative 6.6 %   Eosinophils Relative 2.0 %   Basophils Relative 0.6 %  TSH  Result Value Ref Range   TSH 0.52 mIU/L  Lipid panel  Result Value Ref Range   Cholesterol 168 <200 mg/dL   HDL 48 (L) >13 mg/dL   Triglycerides 086 <578 mg/dL   LDL Cholesterol (Calc) 100 (H) mg/dL (calc)   Total CHOL/HDL Ratio 3.5 <5.0 (calc)   Non-HDL Cholesterol (Calc) 120 <130 mg/dL (calc)  COMPLETE METABOLIC PANEL WITH GFR  Result Value Ref Range   Glucose, Bld 78 65 - 99 mg/dL   BUN 12 7 - 25 mg/dL   Creat 4.69 6.29 - 5.28 mg/dL   GFR, Est Non African American 119 > OR = 60 mL/min/1.74m2   GFR, Est African American 138 > OR = 60 mL/min/1.38m2   BUN/Creatinine Ratio NOT APPLICABLE 6 - 22 (calc)   Sodium 140 135 - 146 mmol/L   Potassium 3.7 3.5 - 5.3 mmol/L   Chloride 104 98 - 110 mmol/L   CO2 27 20 - 32 mmol/L   Calcium 9.4 8.6 - 10.2 mg/dL   Total Protein 6.6 6.1 - 8.1 g/dL   Albumin 4.2 3.6 - 5.1 g/dL   Globulin 2.4 1.9 - 3.7 g/dL (calc)   AG Ratio 1.8 1.0 - 2.5 (calc)   Total Bilirubin 0.8 0.2 - 1.2 mg/dL   Alkaline phosphatase (APISO) 62 33 - 115 U/L   AST 16 10 - 30 U/L   ALT 16 6 - 29 U/L  TEST AUTHORIZATION  Result Value Ref Range   TEST NAME: SURESWAB(R) TRICHOMONAS    TEST CODE: [id number]    CLIENT CONTACT: KELITA RUSSELL    REPORT ALWAYS MESSAGE SIGNATURE    POCT Wet Prep [facility])  Result Value Ref Range   Source Wet Prep POC vagina    WBC, Wet Prep HPF POC moderate    Bacteria Wet Prep HPF POC Moderate (A) Few   BACTERIA WET PREP MORPHOLOGY POC     Clue Cells Wet  Prep HPF POC None None   Clue Cells Wet Prep Whiff POC Negative Whiff    Yeast Wet Prep HPF POC None None  KOH Wet Prep POC None None   Trichomonas Wet Prep HPF POC Absent Absent  Pap IG and HPV (high risk) DNA detection  Result Value Ref Range   Clinical Information:     LMP:     PREV. PAP:     PREV. BX:     HPV DNA Probe-Source     STATEMENT OF ADEQUACY:     INTERPRETATION/RESULT:     Comment:     CYTOTECHNOLOGIST:     HPV DNA High Risk Not Detected Not Detect      Assessment & Plan:   Problem List Items Addressed This Visit    None    Visit Diagnoses    Shortness of breath    -  Primary   Relevant Medications   albuterol (PROVENTIL HFA;VENTOLIN HFA) 108 (90 Base) MCG/ACT inhaler   montelukast (SINGULAIR) 10 MG tablet   Other Relevant Orders   Spirometry with graph     Acute shortness of breath with symptoms of airway constriction.  Differential diagnosis: allergy vs asthma.  Patient has family history of adult-onset asthma and no new potential exposures for allergy reaction.  No evidence of stridor on exam today.  Plan: 1. Work to identify possible allergy triggers. 2. START albuterol 1-2 puffs every 6 hr prn for shortness of breath/wheezing. 3. START montelukast 10 mg once daily 4. Spirometry for asthma eval. 5. Reviewed emergency signs and symptoms.   6. May need to have epipen if albuterol not helpful.  Patient instructed to call at next occurrence if albuterol and montelukast do not prevent / control symptoms. 7. Followup 3 months or sooner if needed.  Meds ordered this encounter  Medications  . albuterol (PROVENTIL HFA;VENTOLIN HFA) 108 (90 Base) MCG/ACT inhaler    Sig: Inhale 1-2 puffs into the lungs every 6 (six) hours as needed for shortness of breath.    Dispense:  1 Inhaler    Refill:  2    Product selection permitted for insurance/patient brand preference    Order Specific Question:   Supervising Provider    Answer:   Smitty Cords [[id number]]    . montelukast (SINGULAIR) 10 MG tablet    Sig: Take 1 tablet (10 mg total) by mouth at bedtime.    Dispense:  30 tablet    Refill:  3    Order Specific Question:   Supervising Provider    Answer:   Smitty Cords [[id number]]    Follow up plan: Return in about 3 months (around 10/24/2018) for asthma AND 9/26am with [facility] for spirometry (pre and post).  [name], DNP, AGPCNP-BC Adult Gerontology Primary Care Nurse Practitioner [facility] 07/25/2018, 10:17 AM

## 2018-07-25 NOTE — Patient Instructions (Addendum)
[name],   Thank you for coming in to clinic today.  1. You may have asthma.  We will confirm this with spirometry. - Avoid using albuterol within 6 hours prior to testing.  2. START albuterol 1-2 puffs every 4 hours as needed for shortness of breath  3. START montelukast 10 mg once daily.  Please schedule a follow-up appointment with [name], AGNP. Return in about 3 months (around 10/24/2018) for asthma AND 9/26am with [facility] for spirometry (pre and post).  If you have any other questions or concerns, please feel free to call the clinic or send a message through MyChart. You may also schedule an earlier appointment if necessary.  You will receive a survey after today's visit either digitally by e-mail or paper by [employer]. Your experiences and feedback matter to [place].  Please respond so we know how we are doing as we provide care for you.   [name], DNP, AGNP-BC Adult Gerontology Nurse Practitioner [facility], [facility]

## 2018-07-26 ENCOUNTER — Encounter: Payer: Self-pay | Admitting: Nurse Practitioner

## 2018-08-16 ENCOUNTER — Ambulatory Visit (INDEPENDENT_AMBULATORY_CARE_PROVIDER_SITE_OTHER): Payer: Medicaid Other

## 2018-08-16 VITALS — Resp 16 | Ht 63.0 in | Wt 130.0 lb

## 2018-08-16 DIAGNOSIS — R0602 Shortness of breath: Secondary | ICD-10-CM | POA: Diagnosis not present

## 2018-08-16 LAB — SPIROMETRY WITH GRAPH

## 2018-08-29 ENCOUNTER — Encounter: Payer: Self-pay | Admitting: Nurse Practitioner

## 2018-08-29 DIAGNOSIS — M545 Low back pain, unspecified: Secondary | ICD-10-CM

## 2018-08-29 DIAGNOSIS — J4520 Mild intermittent asthma, uncomplicated: Secondary | ICD-10-CM

## 2018-08-30 MED ORDER — FLUTICASONE FUROATE-VILANTEROL 100-25 MCG/INH IN AEPB: 1.0000 | INHALATION_SPRAY | Freq: Every day | RESPIRATORY_TRACT | 5 refills | Status: DC

## 2018-09-03 MED ORDER — BACLOFEN 10 MG PO TABS: 5.0000 mg | ORAL_TABLET | Freq: Three times a day (TID) | ORAL | 0 refills | Status: DC

## 2018-09-03 NOTE — Addendum Note (Signed)
Addended by: Vernard Gambles on: 09/03/2018 05:42 PM   Modules accepted: Orders

## 2018-09-05 ENCOUNTER — Encounter: Payer: Self-pay | Admitting: Nurse Practitioner

## 2018-09-05 DIAGNOSIS — M545 Low back pain, unspecified: Secondary | ICD-10-CM

## 2018-09-05 DIAGNOSIS — J4520 Mild intermittent asthma, uncomplicated: Secondary | ICD-10-CM

## 2018-09-06 MED ORDER — MOMETASONE FURO-FORMOTEROL FUM 100-5 MCG/ACT IN AERO: 2.0000 | INHALATION_SPRAY | Freq: Two times a day (BID) | RESPIRATORY_TRACT | 5 refills | Status: DC

## 2018-09-11 DIAGNOSIS — M9905 Segmental and somatic dysfunction of pelvic region: Secondary | ICD-10-CM | POA: Diagnosis not present

## 2018-09-11 DIAGNOSIS — M9903 Segmental and somatic dysfunction of lumbar region: Secondary | ICD-10-CM | POA: Diagnosis not present

## 2018-09-11 DIAGNOSIS — M5416 Radiculopathy, lumbar region: Secondary | ICD-10-CM | POA: Diagnosis not present

## 2018-09-11 DIAGNOSIS — M955 Acquired deformity of pelvis: Secondary | ICD-10-CM | POA: Diagnosis not present

## 2018-09-14 ENCOUNTER — Encounter: Payer: Self-pay | Admitting: Nurse Practitioner

## 2018-09-17 DIAGNOSIS — M955 Acquired deformity of pelvis: Secondary | ICD-10-CM | POA: Diagnosis not present

## 2018-09-17 DIAGNOSIS — M9905 Segmental and somatic dysfunction of pelvic region: Secondary | ICD-10-CM | POA: Diagnosis not present

## 2018-09-17 DIAGNOSIS — M5416 Radiculopathy, lumbar region: Secondary | ICD-10-CM | POA: Diagnosis not present

## 2018-09-17 DIAGNOSIS — M9903 Segmental and somatic dysfunction of lumbar region: Secondary | ICD-10-CM | POA: Diagnosis not present

## 2018-09-21 DIAGNOSIS — M5416 Radiculopathy, lumbar region: Secondary | ICD-10-CM | POA: Diagnosis not present

## 2018-09-21 DIAGNOSIS — M955 Acquired deformity of pelvis: Secondary | ICD-10-CM | POA: Diagnosis not present

## 2018-09-21 DIAGNOSIS — M9905 Segmental and somatic dysfunction of pelvic region: Secondary | ICD-10-CM | POA: Diagnosis not present

## 2018-09-21 DIAGNOSIS — M9903 Segmental and somatic dysfunction of lumbar region: Secondary | ICD-10-CM | POA: Diagnosis not present

## 2018-09-24 DIAGNOSIS — M9905 Segmental and somatic dysfunction of pelvic region: Secondary | ICD-10-CM | POA: Diagnosis not present

## 2018-09-24 DIAGNOSIS — M5416 Radiculopathy, lumbar region: Secondary | ICD-10-CM | POA: Diagnosis not present

## 2018-09-24 DIAGNOSIS — M9903 Segmental and somatic dysfunction of lumbar region: Secondary | ICD-10-CM | POA: Diagnosis not present

## 2018-09-24 DIAGNOSIS — M955 Acquired deformity of pelvis: Secondary | ICD-10-CM | POA: Diagnosis not present

## 2018-09-26 DIAGNOSIS — M9905 Segmental and somatic dysfunction of pelvic region: Secondary | ICD-10-CM | POA: Diagnosis not present

## 2018-09-26 DIAGNOSIS — M955 Acquired deformity of pelvis: Secondary | ICD-10-CM | POA: Diagnosis not present

## 2018-09-26 DIAGNOSIS — M5416 Radiculopathy, lumbar region: Secondary | ICD-10-CM | POA: Diagnosis not present

## 2018-09-26 DIAGNOSIS — M9903 Segmental and somatic dysfunction of lumbar region: Secondary | ICD-10-CM | POA: Diagnosis not present

## 2018-10-15 DIAGNOSIS — M9905 Segmental and somatic dysfunction of pelvic region: Secondary | ICD-10-CM | POA: Diagnosis not present

## 2018-10-15 DIAGNOSIS — M9903 Segmental and somatic dysfunction of lumbar region: Secondary | ICD-10-CM | POA: Diagnosis not present

## 2018-10-15 DIAGNOSIS — M62830 Muscle spasm of back: Secondary | ICD-10-CM | POA: Diagnosis not present

## 2018-10-15 DIAGNOSIS — M9904 Segmental and somatic dysfunction of sacral region: Secondary | ICD-10-CM | POA: Diagnosis not present

## 2018-10-24 ENCOUNTER — Ambulatory Visit: Payer: Medicaid Other | Admitting: Nurse Practitioner

## 2018-10-24 ENCOUNTER — Other Ambulatory Visit: Payer: Self-pay

## 2018-10-24 ENCOUNTER — Encounter: Payer: Self-pay | Admitting: Nurse Practitioner

## 2018-10-24 VITALS — BP 114/64 | HR 113 | Temp 98.4°F | Resp 18 | Ht 63.0 in | Wt 131.0 lb

## 2018-10-24 DIAGNOSIS — F329 Major depressive disorder, single episode, unspecified: Secondary | ICD-10-CM | POA: Diagnosis not present

## 2018-10-24 DIAGNOSIS — F32A Depression, unspecified: Secondary | ICD-10-CM

## 2018-10-24 DIAGNOSIS — Z23 Encounter for immunization: Secondary | ICD-10-CM

## 2018-10-24 DIAGNOSIS — F419 Anxiety disorder, unspecified: Secondary | ICD-10-CM

## 2018-10-24 DIAGNOSIS — J4520 Mild intermittent asthma, uncomplicated: Secondary | ICD-10-CM

## 2018-10-24 DIAGNOSIS — R0789 Other chest pain: Secondary | ICD-10-CM | POA: Diagnosis not present

## 2018-10-24 DIAGNOSIS — R000 Tachycardia, unspecified: Secondary | ICD-10-CM

## 2018-10-24 MED ORDER — BUPROPION HCL ER (XL) 300 MG PO TB24: 300.0000 mg | ORAL_TABLET | Freq: Every day | ORAL | 5 refills | Status: DC

## 2018-10-24 MED ORDER — MOMETASONE FURO-FORMOTEROL FUM 200-5 MCG/ACT IN AERO: 2.0000 | INHALATION_SPRAY | Freq: Two times a day (BID) | RESPIRATORY_TRACT | 1 refills | Status: DC

## 2018-10-24 NOTE — Progress Notes (Signed)
Subjective:    Patient ID: [name], female    DOB: September 05, 1986, 32 y.o.   MRN: [id number]  Angela MallingCarla M Buhman is a 32 y.o. female presenting on 10/24/2018 for Asthma (pt states not much improvement with the inhalers. She still feel SOB. heaviness in the chest ) and Palpitations (pt concern about elevated heart rate. She just got a new phone that monitor her heart rate.  She notice her heart rat at rest is always in the high 90's -100)   HPI Chest pressure  Patient presents today for evaluation of ongoing chest pressure.  She was encouraged to follow-up again by her father who was recently diagnosed with afib and suggests that her resting HR should be lower than 90s and her chest pressure needs evaluation.  Asthma Patient feels like she is always running a marathon with heavy chest, shortness of breath with activity.  - Notes no significant change after starting inhalers.    Tachycardia Has elevated resting HR, palpitations regularly not many this morning.  Palpitations are often associated with anxiety.  Patient follows this with her fitbit and notes usually resting in 90-100 range with elevations above 100 frequently.  Anxiety Patient still likes Wellbutrin and its effects except for night sweats.  Feels better controlled, but admits anxiety is currently very high  Social History   Tobacco Use  . Smoking status: Never Smoker  . Smokeless tobacco: Never Used  Substance Use Topics  . Alcohol use: No  . Drug use: No    Review of Systems Per HPI unless specifically indicated above    Objective:    BP 114/64 (BP Location: Right Arm, Patient Position: Sitting, Cuff Size: Normal)   Pulse (!) 113   Temp 98.4 F (36.9 C) (Oral)   Resp 18   Ht 5\' 3"  (1.6 m)   Wt 131 lb (59.4 kg)   SpO2 99%   BMI 23.21 kg/m   Wt Readings from Last 3 Encounters:  10/24/18 131 lb (59.4 kg)  08/16/18 130 lb (59 kg)  07/25/18 130 lb 12.8 oz (59.3 kg)    Physical Exam  Constitutional: She is  oriented to person, place, and time. She appears well-developed and well-nourished. No distress.  HENT:  Head: Normocephalic and atraumatic.  Cardiovascular: Normal rate, regular rhythm, S1 normal, S2 normal, normal heart sounds and intact distal pulses.  Pulmonary/Chest: Effort normal and breath sounds normal. No stridor. No respiratory distress. She has no wheezes. She has no rales.  Neurological: She is alert and oriented to person, place, and time.  Skin: Skin is warm and dry. Capillary refill takes less than 2 seconds.  Psychiatric: Her speech is normal and behavior is normal. Judgment and thought content normal. Her mood appears anxious. Cognition and memory are normal.  Vitals reviewed.   Results for orders placed or performed in visit on 08/16/18  Spirometry with graph  Result Value Ref Range   Scan Result     10/24/18 EKG: NSR  HR 94bpm PR 150ms QRS 84ms     QT 338ms   QTc 397ms  Axis deviation: none      Assessment & Plan:   Problem List Items Addressed This Visit      Other   Anxiety and depression - Primary   Relevant Medications   buPROPion (WELLBUTRIN XL) 300 MG 24 hr tablet    Other Visit Diagnoses    Mild intermittent asthma without complication       Relevant Medications  mometasone-formoterol (DULERA) 200-5 MCG/ACT AERO   Other Relevant Orders   Peak flow meter   Tachycardia       Relevant Orders   TSH   EKG 12-Lead   T4, free   Sensation of chest pressure       Relevant Orders   EKG 12-Lead   Needs flu shot       Relevant Orders   Flu Vaccine QUAD 6+ mos PF IM (Fluarix Quad PF)    # Chest pressure/Tachycardia Multifactorial cause for this symptom.  Unlikely cardiac cause as EKG is normal today. Patient did have borderline low TSH at last check.   Plan: 1. EKG normal 2. Recheck TSH and free T4   # Asthma Likely continues to have poor control.  Patient not currently using and has never used peak flow meter.  Continues inhalers without  complication, but no significant response.  Plan: 1. Start peak flow meter 2. Increase dose of Dulera 3. Continue albuterol prn 4. Consider future pulmonology referral if no improvement with med changes.    # Anxiety  Continues to be poorly controlled with situational/seasonal variant.  Patient with good response in general with Wellbutrin since starting.  Plan: 1. Increase Wellbutrin to 300 mg 24 hr tab once daily 2. Encouraged patient to consider counseling. 3. For anxiety and palpitations in future consider propranolol if wellbutrin is not helpful. 4. Follow-up 6 weeks.  Meds ordered this encounter  Medications  . mometasone-formoterol (DULERA) 200-5 MCG/ACT AERO    Sig: Inhale 2 puffs into the lungs 2 (two) times daily.    Dispense:  13 g    Refill:  1    Order Specific Question:   Supervising Provider    Answer:   Smitty Cords [[id number]]  . buPROPion (WELLBUTRIN XL) 300 MG 24 hr tablet    Sig: Take 1 tablet (300 mg total) by mouth daily.    Dispense:  30 tablet    Refill:  5    Order Specific Question:   Supervising Provider    Answer:   Smitty Cords [[id number]]   Follow up plan: Return in about 6 weeks (around 12/05/2018) for asthma, anxiety.  [name], DNP, AGPCNP-BC Adult Gerontology Primary Care Nurse Practitioner [facility] 10/24/2018, 10:16 AM

## 2018-10-24 NOTE — Patient Instructions (Addendum)
[name],   Thank you for coming in to clinic today.  1. For asthma -  INCREASE Dulera to 200-5 mcg 2 puffs twice daliy.  2. For anxiety -  INCREASE wellbutrin to 300 mg 24 hr tab.  Take once daily.  3. Monitor chest pressure symptoms.  What causes it? Anxiety, breathing, with activity, at rest? - Follow-up with log in 2-3 weeks if no change using above medications. - We may need to send you for cardiology consult.  4. EKG today is normal.  Please schedule a follow-up appointment with [name], AGNP. Return in about 6 weeks (around 12/05/2018) for asthma, anxiety.  If you have any other questions or concerns, please feel free to call the clinic or send a message through MyChart. You may also schedule an earlier appointment if necessary.  You will receive a survey after today's visit either digitally by e-mail or paper by [employer]. Your experiences and feedback matter to [place].  Please respond so we know how we are doing as we provide care for you.   [name], DNP, AGNP-BC Adult Gerontology Nurse Practitioner [facility], [facility]   Peak Flow Meter A peak flow meter is a device that helps you determine how well your asthma is being controlled. The device measures the flow of air out of your lungs. This is a simple but important tool in daily asthma management. Peak flow meters are available over the counter. The readings from the meter will help you and your health care provider:  Determine the severity of your asthma.  Evaluate the effectiveness of your current treatment.  Determine when to add or stop certain medicines.  Recognize an asthma attack before signs or symptoms appear.  Decide when to seek emergency care.  What are the risks?  Dizziness. Breathing too quickly into the peak flow meter may cause dizziness. This could cause you to pass out. Take your time so you do not get dizzy or light-headed.  False readings. Not cleaning your  meter may cause false results. You may not know that your asthma is getting better or getting worse, making you to start or stop treatment improperly. How to find your personal best Your "personal best" is the highest peak flow rate you can reach when you feel good and have no asthma symptoms. This can be used as your standard for comparing your peak flow meter readings. Because everyone's asthma is different, your personal best will be unique to you. Your health care provider will help you to figure out your personal best. Typically, you will take readings once or twice a day for 2 weeks when you are not having symptoms. The highest reading during the trial period is your personal best. Because your lung function can change over time, your personal best should be measured each year. How to use your peak flow meter 1. Move the upper marker to the number that is your personal best. 2. Move the lower marker to the bottom of the numbered scale. 3. Connect the mouthpiece to the peak flow meter. 4. Stand up. 5. Take a deep breath. Make sure you fill your lungs completely. 6. Place your lips tightly around the mouthpiece. Blow as hard and as fast as you can with a single breath (forced exhalation), as if you are blowing out candles. If your lips are not placed tightly around the mouthpiece of the peak flow meter, you will get incorrect low readings. 7. At the end of your  forced exhale, check to see what number the lower marker landed on. This is your peak flow rate. 8. Move the lower marker back to the bottom of the numbered scale. 9. Repeat these steps 2 more times. Record the highest reading of the 3 tries in your asthma diary. Do not calculate the average for your 3 tries, just record the highest. Always write down the results in your asthma diary. After using your peak flow meter, rest and breathe slowly and easily. Keep a record of your progress. Your health care provider can provide you with a simple  table to help with this. For the most accurate readings, it is important to keep your peak flow meter clean. Follow the manufacturer's instructions on how to take care of your peak flow meter. Your health care provider will give you instructions on when to do regular monitoring. You may also need to check your peak flow when:  Coughing, wheezing, or other asthma symptoms wake you up at night.  Your asthma symptoms worsen during the day.  Your breathing is made worse because of a cold, flu, or other respiratory illness.  You need to use your quick-relief, "rescue medicine." It is best to check your peak flow rate before you use rescue medicine, and then 20-30 minutes afterward. This will help determine whether you need to take the rescue medicine again.  How to use your results Use color-coded zones on the meter to see how your peak flow rate compares to your personal best. If your peak flow readings fall too far below your personal best into the yellow or red zone, you will need to take action to prevent or minimize an asthma attack. The color code for each zone reflects progressively more severe symptoms: Green Zone = Stable   Peak flow rate between 80% and 100% of your personal best. Your asthma is under good control when your peak flow rate is in the green zone.  When you are in the green zone, you are not likely to be experiencing any asthma symptoms.  Only take your regular, preventive medicine. Your rescue medicine is not required. Yellow Zone = Caution   Peak flow rate between 50% and 80% of your personal best. Your asthma is getting worse and could be improved.  You may have begun coughing, wheezing, or feeling chest tightness. Sometimes peak flow rates dip down into the yellow zone before asthma symptoms appear.  Consider increasing or changing your asthma medicine. This may include using your rescue medicine. If you have an asthma action plan, follow each step listed for the  yellow zone, including medicine changes. Red Zone = Danger   Peak flow rate below 50% of your personal best. This may mean you are having, or are at risk of having, a medical emergency.  Your coughing, wheezing, or shortness of breath may have become severe. Do not wait to use your rescue medicine. Stop whatever you are doing and use your rescue inhaler, nebulizer, or other medicines to open your airways.  If you have an asthma action plan, follow each step listed for the red zone, including medicine changes and when to seek emergency medical care. Contact a health care provider if: You are in the yellow zone. If you have an asthma action plan, follow all of the steps listed under the yellow zone section of the plan. Let your health care provider know that you are in the yellow zone. Get help right away if: You are in the red  zone. If you have an asthma action plan, follow all of the steps listed under the red zone section of the plan while you are seeking immediate medical care. Summary  A peak flow meter is a device that helps you determine how well your asthma is being controlled. The device measures the flow of air out of your lungs.  Readings from the meter will help you determine the severity of your asthma, whether your treatment is working, and when to start or stop treatment.  Measure your personal best every year during periods of no symptoms. The meter will compare this reading to your regular readings to determine your condition at a given time.  Work with your health care provider to understand what each zone (green, yellow, red) means and what actions to take in each zone. This information is not intended to replace advice given to you by your health care provider. Make sure you discuss any questions you have with your health care provider. Document Released: 09/04/2007 Document Revised: 11/03/2016 Document Reviewed: 11/03/2016 Elsevier Interactive Patient Education  2017  [employer]

## 2018-10-25 LAB — TSH: TSH: 0.61 mIU/L

## 2018-10-25 LAB — T4, FREE: Free T4: 1.3 ng/dL (ref 0.8–1.8)

## 2018-10-29 DIAGNOSIS — M9903 Segmental and somatic dysfunction of lumbar region: Secondary | ICD-10-CM | POA: Diagnosis not present

## 2018-10-29 DIAGNOSIS — M62830 Muscle spasm of back: Secondary | ICD-10-CM | POA: Diagnosis not present

## 2018-10-29 DIAGNOSIS — M9905 Segmental and somatic dysfunction of pelvic region: Secondary | ICD-10-CM | POA: Diagnosis not present

## 2018-10-29 DIAGNOSIS — M9904 Segmental and somatic dysfunction of sacral region: Secondary | ICD-10-CM | POA: Diagnosis not present

## 2018-11-02 ENCOUNTER — Encounter: Payer: Self-pay | Admitting: Nurse Practitioner

## 2018-11-02 DIAGNOSIS — R002 Palpitations: Secondary | ICD-10-CM

## 2018-11-02 DIAGNOSIS — R000 Tachycardia, unspecified: Secondary | ICD-10-CM

## 2018-11-02 DIAGNOSIS — F32A Depression, unspecified: Secondary | ICD-10-CM

## 2018-11-02 DIAGNOSIS — F419 Anxiety disorder, unspecified: Secondary | ICD-10-CM

## 2018-11-02 DIAGNOSIS — A6004 Herpesviral vulvovaginitis: Secondary | ICD-10-CM

## 2018-11-02 DIAGNOSIS — F329 Major depressive disorder, single episode, unspecified: Secondary | ICD-10-CM

## 2018-11-02 NOTE — Telephone Encounter (Signed)
.  mychart

## 2018-11-05 DIAGNOSIS — A6000 Herpesviral infection of urogenital system, unspecified: Secondary | ICD-10-CM | POA: Insufficient documentation

## 2018-11-05 HISTORY — DX: Herpesviral infection of urogenital system, unspecified: A60.00

## 2018-11-05 MED ORDER — BUPROPION HCL ER (XL) 150 MG PO TB24: 150.0000 mg | ORAL_TABLET | Freq: Every day | ORAL | 5 refills | Status: DC

## 2018-11-05 MED ORDER — PROPRANOLOL HCL ER 60 MG PO CP24: 60.0000 mg | ORAL_CAPSULE | Freq: Every day | ORAL | 5 refills | Status: DC

## 2018-11-05 MED ORDER — ACYCLOVIR 400 MG PO TABS: 400.0000 mg | ORAL_TABLET | Freq: Three times a day (TID) | ORAL | 2 refills | Status: DC

## 2018-11-05 NOTE — Telephone Encounter (Signed)
Mychart message

## 2018-11-05 NOTE — Addendum Note (Signed)
Addended by: Wilhelmina McardleKENNEDY, Amjad Fikes R on: 11/05/2018 11:35 AM   Modules accepted: Orders

## 2018-11-05 NOTE — Telephone Encounter (Signed)
Please advise 

## 2018-12-07 ENCOUNTER — Ambulatory Visit: Payer: Medicaid Other | Admitting: Nurse Practitioner

## 2018-12-07 ENCOUNTER — Encounter: Payer: Self-pay | Admitting: Nurse Practitioner

## 2018-12-07 ENCOUNTER — Other Ambulatory Visit (HOSPITAL_COMMUNITY)
Admission: RE | Admit: 2018-12-07 | Discharge: 2018-12-07 | Disposition: A | Discharge status: Home / Self Care | Payer: Medicaid Other | Source: Ambulatory Visit | Attending: Nurse Practitioner | Admitting: Nurse Practitioner

## 2018-12-07 VITALS — BP 106/60 | HR 100 | Temp 98.5°F | Resp 16 | Ht 63.0 in | Wt 126.0 lb

## 2018-12-07 DIAGNOSIS — R0602 Shortness of breath: Secondary | ICD-10-CM

## 2018-12-07 DIAGNOSIS — M545 Low back pain, unspecified: Secondary | ICD-10-CM

## 2018-12-07 DIAGNOSIS — F419 Anxiety disorder, unspecified: Secondary | ICD-10-CM | POA: Diagnosis not present

## 2018-12-07 DIAGNOSIS — N898 Other specified noninflammatory disorders of vagina: Secondary | ICD-10-CM | POA: Insufficient documentation

## 2018-12-07 DIAGNOSIS — J4520 Mild intermittent asthma, uncomplicated: Secondary | ICD-10-CM | POA: Diagnosis not present

## 2018-12-07 DIAGNOSIS — F329 Major depressive disorder, single episode, unspecified: Secondary | ICD-10-CM

## 2018-12-07 DIAGNOSIS — N72 Inflammatory disease of cervix uteri: Secondary | ICD-10-CM | POA: Diagnosis not present

## 2018-12-07 DIAGNOSIS — B3731 Acute candidiasis of vulva and vagina: Secondary | ICD-10-CM

## 2018-12-07 DIAGNOSIS — F32A Depression, unspecified: Secondary | ICD-10-CM

## 2018-12-07 DIAGNOSIS — B373 Candidiasis of vulva and vagina: Secondary | ICD-10-CM

## 2018-12-07 MED ORDER — BACLOFEN 10 MG PO TABS: 5.0000 mg | ORAL_TABLET | Freq: Three times a day (TID) | ORAL | 1 refills | Status: DC | PRN

## 2018-12-07 MED ORDER — ALBUTEROL SULFATE HFA 108 (90 BASE) MCG/ACT IN AERS: 1.0000 | INHALATION_SPRAY | Freq: Four times a day (QID) | RESPIRATORY_TRACT | 2 refills | Status: DC | PRN

## 2018-12-07 MED ORDER — MOMETASONE FURO-FORMOTEROL FUM 200-5 MCG/ACT IN AERO: 2.0000 | INHALATION_SPRAY | Freq: Two times a day (BID) | RESPIRATORY_TRACT | 1 refills | Status: DC

## 2018-12-07 MED ORDER — BUPROPION HCL ER (SR) 100 MG PO TB12: 100.0000 mg | ORAL_TABLET | Freq: Two times a day (BID) | ORAL | 2 refills | Status: DC

## 2018-12-07 NOTE — Progress Notes (Signed)
Subjective:    Patient ID: [name], female    DOB: 08/17/1986, 33 y.o.   MRN: [id number]  [name] is a 33 y.o. female presenting on 12/07/2018 for Anxiety and Asthma   HPI Anxiety Patient is currently on WellbutrinXL 150 mg once daily.  Patient has not continued the propranolol because it caused dizziness.  She only took for about a week. This was was started at last visit for her palpitations/anxiety, but patient has not had ongoing problems with palpitations.  Feels that things work really well initially, then stop working.    Higher doses Wellbutrin causes tinnitus.  Prior meds: nortriptyline (Dr. Everlena CooperJaffe) for migraines - but no recall about if this helped with anxiety, Zoloft (not tolerated at higher doses), buspirone - Effexor caused dizziness, nausea - on twice: initially for several months, then for only one week  - Lexapro - in HS and doesn't remember how this worked - P[place]il [facility] in [facility] and doesn't remember - Lithium in past "zombie-like" - Depakote doesn't remember why it was stopped. - Lamictal - worked really well, but stopped taking this when felt much better and reports she did well for several years after stopping this med.    Patient had been initially diagnosed with Bipolar disorder in past, reports no prior manic episodes and was told prior to my treatment with her that she may have been misdiagnosed.  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  Review of baclofen - patient notes improves headache after taking for back pain.  Has previously taken baclofen for headache associated with head trauma which worked.  Desires to continue baclofen prn.  GAD 7 : Generalized Anxiety Score 12/07/2018 06/15/2018 02/15/2018 12/25/2017  Nervous, Anxious, on Edge 3 2 1 3   Control/stop worrying 2 3 1 3   Worry too much - different things 2 3 1 3   Trouble relaxing 3 2 3 3   Restless 1 1 0 1    Easily annoyed or irritable 3 3 3 3   Afraid - awful might happen 1 1 0 1  Total GAD 7 Score 15 15 9 17   Anxiety Difficulty Very difficult Very difficult Very difficult Somewhat difficult   [facility] 12/07/2018 06/15/2018 03/15/2018 02/15/2018 12/25/2017  Decreased Interest 3 2 2 1 3   Down, Depressed, Hopeless 1 2 1 1 3   PHQ - 2 Score 4 4 3 2 6   Altered sleeping 3 2 1 1 3   Tired, decreased energy 3 3 2 3 3   Change in appetite 0 0 1 0 1  Feeling bad or failure about yourself  0 2 0 0 1  Trouble concentrating 2 1 3 3 3   Moving slowly or fidgety/restless 0 0 0 0 1  Suicidal thoughts 0 0 0 0 1  PHQ-9 Score 12 12 10 9 19   Difficult doing work/chores Very difficult Very difficult Not difficult at all Very difficult Somewhat difficult    Asthma Elwin SleightDulera makes her feel more anxious for about 1 hour after taking.  Is helping breathing some.  Continues having shortness of breath despite maintenance inhaler.  Stopped singulair a "few weeks ago" and did not notice any worsening of symptoms when she stopped taking this. Continues albuterol rescue inhaler once every 2 days.    Vaginal Discharge  Notes odorous, yellow/green discharge that started about 1 week ago.   Social History   Tobacco Use  . Smoking status:  Never Smoker  . Smokeless tobacco: Never Used  Substance Use Topics  . Alcohol use: Yes  . Drug use: No    Review of Systems Per HPI unless specifically indicated above     Objective:    BP 106/60   Pulse 100   Temp 98.5 F (36.9 C) (Oral)   Resp 16   Ht 5\' 3"  (1.6 m)   Wt 126 lb (57.2 kg)   SpO2 99%   BMI 22.32 kg/m   Wt Readings from Last 3 Encounters:  12/07/18 126 lb (57.2 kg)  10/24/18 131 lb (59.4 kg)  08/16/18 130 lb (59 kg)    Physical Exam Vitals signs reviewed.  Constitutional:      General: She is not in acute distress.    Appearance: She is well-developed.  HENT:     Head: Normocephalic and atraumatic.  Cardiovascular:     Rate and  Rhythm: Normal rate and regular rhythm.     Pulses:          Radial pulses are 2+ on the right side and 2+ on the left side.       Posterior tibial pulses are 1+ on the right side and 1+ on the left side.     Heart sounds: Normal heart sounds, S1 normal and S2 normal.  Pulmonary:     Effort: Pulmonary effort is normal. No respiratory distress.     Breath sounds: Normal breath sounds and air entry.  Genitourinary:    Comments: Normal external female genitalia without lesions or fusion. Vaginal canal without lesions. Cervicitis, yellow discharge, white discharge covering cervix. Mild CMT. Musculoskeletal:     Right lower leg: No edema.     Left lower leg: No edema.  Skin:    General: Skin is warm and dry.     Capillary Refill: Capillary refill takes less than 2 seconds.  Neurological:     Mental Status: She is alert and oriented to person, place, and time.  Psychiatric:        Attention and Perception: Attention normal.        Mood and Affect: Mood and affect normal.        Behavior: Behavior normal. Behavior is cooperative.     Results for orders placed or performed in visit on 10/24/18  TSH  Result Value Ref Range   TSH 0.61 mIU/L  T4, free  Result Value Ref Range   Free T4 1.3 0.8 - 1.8 ng/dL      Assessment & Plan:   Problem List Items Addressed This Visit      Other   Anxiety and depression - Primary Uncontrolled.  Patient with multiple SSRI failures.  Now noting very little relief with Wellbutrin.  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    Plan: 1. CHANGE Wellbutrin to 100 mg SR one tab twice daily to increase overall dose, aim to avoid tinnitus that was experienced at 300 mg once daily. 2. Referral psychiatry for eval and treat.  May need to return to lamictal for symptoms. 3. Follow-up prn and 6 weeks if not seen by psychiatry.   Relevant Medications    buPROPion (WELLBUTRIN SR) 100 MG 12 hr tablet   Other Relevant Orders   Ambulatory referral to Psychiatry    Other Visit Diagnoses    Mild intermittent asthma without complication     Stable, but with persistently poor  control per patient.  Exam normal today without wheezing.   - Continue dulera and albuterol - Continue off montelukast since it did not change symptoms - referral pulmonology for additional evaluation and management of meds. - Follow-up prn.   Relevant Medications   albuterol (PROVENTIL HFA;VENTOLIN HFA) 108 (90 Base) MCG/ACT inhaler   mometasone-formoterol (DULERA) 200-5 MCG/ACT AERO   Other Relevant Orders   Ambulatory referral to Pulmonology   Shortness of breath       Relevant Medications   albuterol (PROVENTIL HFA;VENTOLIN HFA) 108 (90 Base) MCG/ACT inhaler   Acute low back pain without sciatica, unspecified back pain laterality     Patient with ongoing, but improving back pain.  Requests med refill.  Refill of baclofen provided.  Follow-up 4-6weeks prn.   Relevant Medications   baclofen (LIORESAL) 10 MG tablet   Cervicitis     Patient with cervicitis, possible STI.  Symptoms and discharge consistent with chlamydia.  Swab sent for diagnosis.  Will treat once resulted.  Encouraged ongoing safe sex practices.  Follow-up prn.   Relevant Orders   Cervicovaginal ancillary only   Vaginal discharge       Relevant Orders   Cervicovaginal ancillary only      Meds ordered this encounter  Medications  . buPROPion (WELLBUTRIN SR) 100 MG 12 hr tablet    Sig: Take 1 tablet (100 mg total) by mouth 2 (two) times daily.    Dispense:  60 tablet    Refill:  2    Order Specific Question:   Supervising Provider    Answer:   Smitty Cords [[id number]]  . albuterol (PROVENTIL HFA;VENTOLIN HFA) 108 (90 Base) MCG/ACT inhaler    Sig: Inhale 1-2 puffs into the lungs every 6 (six) hours as needed for shortness of breath.    Dispense:  1 Inhaler    Refill:  2    Product  selection permitted for insurance/patient brand preference    Order Specific Question:   Supervising Provider    Answer:   Smitty Cords [[id number]]  . mometasone-formoterol (DULERA) 200-5 MCG/ACT AERO    Sig: Inhale 2 puffs into the lungs 2 (two) times daily.    Dispense:  13 g    Refill:  1    Order Specific Question:   Supervising Provider    Answer:   Smitty Cords [[id number]]  . baclofen (LIORESAL) 10 MG tablet    Sig: Take 0.5-1 tablets (5-10 mg total) by mouth 3 (three) times daily as needed (headache).    Dispense:  30 each    Refill:  1    Order Specific Question:   Supervising Provider    Answer:   Smitty Cords [[id number]]    Follow up plan: Return in about 7 months (around 07/08/2019) for annual physical.  [name], DNP, AGPCNP-BC Adult Gerontology Primary Care Nurse Practitioner [facility] 12/07/2018, 8:16 AM

## 2018-12-07 NOTE — Patient Instructions (Addendum)
[name],   Thank you for coming in to clinic today.  1. CHANGE Wellbutrin SR to 100 mg twice daily - Referral to psychiatry.  PSYCHIATRY / THERAPY-COUSENLING Self Referral [facility] [address], [address] [place], [id number] Phone: [phone] (Option 1) [email or web address]  [facility], available walk-in 9am-4pm M-F [address] [place], [place] [address] Hours: 9am - 4pm (M-F, walk in available) [phone]   [facility]   Address: [address], [place], [place] [address] Hours: 8AM-5PM (accepts walk in to establish) Phone: [phone]  [facility] [address], [place], [place] [address] [phone]   Please schedule a follow-up appointment with Wilhelmina McardleLauren Neilan Rizzo, AGNP. Return in about 7 months (around 07/08/2019) for annual physical.  If you have any other questions or concerns, please feel free to call the clinic or send a message through MyChart. You may also schedule an earlier appointment if necessary.  You will receive a survey after today's visit either digitally by e-mail or paper by [employer] mail. Your experiences and feedback matter to us.  Please respond so we know how we are doing as we provide care for you.   Wilhelmina McardleLauren Rydge Texidor, DNP, AGNP-BC Adult Gerontology Nurse Practitioner [facility]

## 2018-12-09 ENCOUNTER — Encounter: Payer: Self-pay | Admitting: Nurse Practitioner

## 2018-12-11 LAB — CERVICOVAGINAL ANCILLARY ONLY
Bacterial vaginitis: NEGATIVE
Candida vaginitis: POSITIVE — AB
Chlamydia: NEGATIVE
Neisseria Gonorrhea: NEGATIVE
Trichomonas: NEGATIVE

## 2018-12-12 MED ORDER — FLUCONAZOLE 150 MG PO TABS: 150.0000 mg | ORAL_TABLET | Freq: Once | ORAL | 1 refills | Status: AC

## 2018-12-12 NOTE — Addendum Note (Signed)
Addended by: [name] R on: 12/12/2018 09:00 AM   Modules accepted: Orders

## 2018-12-17 ENCOUNTER — Encounter: Payer: Self-pay | Admitting: Nurse Practitioner

## 2018-12-17 ENCOUNTER — Other Ambulatory Visit: Payer: Self-pay | Admitting: Nurse Practitioner

## 2018-12-17 DIAGNOSIS — F419 Anxiety disorder, unspecified: Principal | ICD-10-CM

## 2018-12-17 DIAGNOSIS — F329 Major depressive disorder, single episode, unspecified: Secondary | ICD-10-CM

## 2018-12-17 DIAGNOSIS — F32A Depression, unspecified: Secondary | ICD-10-CM

## 2018-12-17 MED ORDER — BUPROPION HCL ER (XL) 150 MG PO TB24: 150.0000 mg | ORAL_TABLET | Freq: Every day | ORAL | 2 refills | Status: DC

## 2019-01-10 DIAGNOSIS — F4310 Post-traumatic stress disorder, unspecified: Secondary | ICD-10-CM | POA: Diagnosis not present

## 2019-01-11 ENCOUNTER — Encounter: Payer: Self-pay | Admitting: Nurse Practitioner

## 2019-01-11 DIAGNOSIS — F32A Depression, unspecified: Secondary | ICD-10-CM

## 2019-01-11 DIAGNOSIS — F419 Anxiety disorder, unspecified: Principal | ICD-10-CM

## 2019-01-11 DIAGNOSIS — F329 Major depressive disorder, single episode, unspecified: Secondary | ICD-10-CM

## 2019-01-14 MED ORDER — BUPROPION HCL ER (XL) 150 MG PO TB24: 150.0000 mg | ORAL_TABLET | Freq: Every day | ORAL | 2 refills | Status: DC

## 2019-01-14 NOTE — Telephone Encounter (Signed)
Mychart message

## 2019-01-17 DIAGNOSIS — F4310 Post-traumatic stress disorder, unspecified: Secondary | ICD-10-CM | POA: Diagnosis not present

## 2019-01-23 NOTE — Progress Notes (Deleted)
**  CBC 06/22/2018>> absolute eosinophil count 100. **Chest x-ray 12/25/2017>> imaging personally viewed, mild changes of chronic bronchitis, otherwise lungs appear unremarkable.

## 2019-01-24 ENCOUNTER — Institutional Professional Consult (permissible substitution): Payer: Self-pay | Admitting: Internal Medicine

## 2019-01-24 DIAGNOSIS — F4310 Post-traumatic stress disorder, unspecified: Secondary | ICD-10-CM | POA: Diagnosis not present

## 2019-01-31 DIAGNOSIS — F4310 Post-traumatic stress disorder, unspecified: Secondary | ICD-10-CM | POA: Diagnosis not present

## 2019-02-07 DIAGNOSIS — F4310 Post-traumatic stress disorder, unspecified: Secondary | ICD-10-CM | POA: Diagnosis not present

## 2019-02-11 ENCOUNTER — Telehealth: Payer: Self-pay | Admitting: Nurse Practitioner

## 2019-02-11 NOTE — Telephone Encounter (Signed)
Pt may have been exposed to virus.  She has no symptoms but her job put her out of work [phone]

## 2019-02-11 NOTE — Telephone Encounter (Signed)
The pt called stating that she had a patient that she came in contact with on Friday who was recently screened for COVID-19. He had a fever and cough, but didn't meet the travel or exposure criteria so not tested. She states that her job told her that the patient could possible have it, but he was not tested. She was notified by her job and put out of work until further notice.She was told to monitor her symptoms. I informed the patient to continue to monitor symptoms and stay quarantine at least until I follow back with her.  Pt does complain of SOB, coughing and headache, but states this is chronic due to her asthma and head injury. She denies fever. She have not travel any where in the last 14 days. She want to know if its safe to continue taking Ibuprofen, because she read that its not recommended if you have COVID-19. She is extremely concern because she is a single mother. Please advise.

## 2019-02-14 DIAGNOSIS — F4310 Post-traumatic stress disorder, unspecified: Secondary | ICD-10-CM | POA: Diagnosis not present

## 2019-02-21 DIAGNOSIS — F4310 Post-traumatic stress disorder, unspecified: Secondary | ICD-10-CM | POA: Diagnosis not present

## 2019-03-07 DIAGNOSIS — F4310 Post-traumatic stress disorder, unspecified: Secondary | ICD-10-CM | POA: Diagnosis not present

## 2019-03-14 DIAGNOSIS — F4310 Post-traumatic stress disorder, unspecified: Secondary | ICD-10-CM | POA: Diagnosis not present

## 2019-03-21 DIAGNOSIS — F4310 Post-traumatic stress disorder, unspecified: Secondary | ICD-10-CM | POA: Diagnosis not present

## 2019-03-22 ENCOUNTER — Other Ambulatory Visit: Payer: Self-pay | Admitting: Nurse Practitioner

## 2019-03-22 DIAGNOSIS — M545 Low back pain, unspecified: Secondary | ICD-10-CM

## 2019-03-25 DIAGNOSIS — Z309 Encounter for contraceptive management, unspecified: Secondary | ICD-10-CM | POA: Diagnosis not present

## 2019-03-25 DIAGNOSIS — Z1388 Encounter for screening for disorder due to exposure to contaminants: Secondary | ICD-10-CM | POA: Diagnosis not present

## 2019-03-25 DIAGNOSIS — N72 Inflammatory disease of cervix uteri: Secondary | ICD-10-CM | POA: Diagnosis not present

## 2019-03-25 DIAGNOSIS — N809 Endometriosis, unspecified: Secondary | ICD-10-CM | POA: Diagnosis not present

## 2019-03-25 DIAGNOSIS — Z3009 Encounter for other general counseling and advice on contraception: Secondary | ICD-10-CM | POA: Diagnosis not present

## 2019-03-25 DIAGNOSIS — Z113 Encounter for screening for infections with a predominantly sexual mode of transmission: Secondary | ICD-10-CM | POA: Diagnosis not present

## 2019-03-25 DIAGNOSIS — Z0389 Encounter for observation for other suspected diseases and conditions ruled out: Secondary | ICD-10-CM | POA: Diagnosis not present

## 2019-03-28 DIAGNOSIS — F4310 Post-traumatic stress disorder, unspecified: Secondary | ICD-10-CM | POA: Diagnosis not present

## 2019-04-04 ENCOUNTER — Telehealth: Payer: Self-pay

## 2019-04-04 DIAGNOSIS — M7652 Patellar tendinitis, left knee: Secondary | ICD-10-CM | POA: Diagnosis not present

## 2019-04-04 NOTE — Telephone Encounter (Signed)
The pt was recently seen at the [facility] department and had a pelvic exam. The patient states they diagnose her with a cervical infection ( Cervicitis) and treated with 2 abx at the office. The pt was told that her cervix was red and inflamed. She was seen at the health department for cramping and irregular bleeding. She is concern about the  red cervix, because she got family history of cervical cancer. She states Lauren told her that her cervix looked red during her last pelvic exam. I currently scheduled the pt for an appt for tomorrow after. She is requesting a referral to [facility].    She also complains of constant left knee swelling and pain x 1 week. Denies recent injury.

## 2019-04-04 NOTE — Telephone Encounter (Signed)
Apt scheduled for Fri 5/15.  Saralyn Pilar, DO [facility] 04/04/2019, 1:23 PM

## 2019-04-05 ENCOUNTER — Encounter: Payer: Self-pay | Admitting: Family Medicine

## 2019-04-05 ENCOUNTER — Ambulatory Visit (INDEPENDENT_AMBULATORY_CARE_PROVIDER_SITE_OTHER): Payer: Medicaid Other | Admitting: Family Medicine

## 2019-04-05 ENCOUNTER — Other Ambulatory Visit: Payer: Self-pay

## 2019-04-05 VITALS — Ht 62.0 in | Wt 125.0 lb

## 2019-04-05 DIAGNOSIS — N72 Inflammatory disease of cervix uteri: Secondary | ICD-10-CM

## 2019-04-05 DIAGNOSIS — N898 Other specified noninflammatory disorders of vagina: Secondary | ICD-10-CM

## 2019-04-05 NOTE — Progress Notes (Signed)
Subjective:    Patient ID: [name], female    DOB: December 01, 1985, 33 y.o.   MRN: [id number]  [name] is a 33 y.o. female presenting on 04/05/2019 for Joint Swelling (pt went to the walking clinic yesterday and was diagnosed w/ patella tendonitis ) and Abdominal Cramping (irregular bleeding. pt recently diagnosed with a cervical infection ( Cervicitis) and treated with 2 abx at the office. She was told that her cervix was red and inflamed. Would like a referral to OB- GYN )  Virtual / Telehealth Encounter - Video Visit via [email or web address] The purpose of this virtual visit is to provide medical care while limiting exposure to the novel coronavirus (COVID19) for both patient and office staff.  Consent was obtained for remote visit:  Yes.   Answered questions that patient had about telehealth interaction:  Yes.   I discussed the limitations, risks, security and privacy concerns of performing an evaluation and management service by video/telephone. I also discussed with the patient that there may be a patient responsible charge related to this service. The patient expressed understanding and agreed to proceed.  Patient Location: Home Provider Location: [facility] (Office)  PCP is [name], AGPCNP-BC - I am currently covering during her maternity leave.   HPI   Recurrent Pelvic Pain / Cervicitis / Menstrual Abnormality - returns for same issue that has bothered her previously, reviews prior history with me today, episodes of pelvic cramping, discharge and some spotting between menstrual cycle, she had pelvic exam at [facility], did STI testing and was advised she had "inflammed" cervix and she was treated empirically at that time with antibiotics given at time of visit. Then next episode in January 2020 treated by her PCP here with pelvic exam testing again showed negative gonorrhea / chlamydia and positive for yeast, that was treated with diflucan, which did not  help her symptoms. - Her primary concern is that on both pelvic exam she was told she had an "inflamed" or red cervix and that it did not appear normal, despite last Pap 03/15/18 negative for malignancy and no high risk HPV detected. - Admits history of paternal grandmother with dx of cervical vs ovarian cancer, who passed away approx age < 6 - Admits some pelvic pain and cramping at times between menstrual cycle with some abnormal spotting/bleeding as well Admits discharge - Denies any fever chills sweats, abdominal pain   Depression screen [facility] 2/9 12/07/2018 06/15/2018 03/15/2018  Decreased Interest 3 2 2   Down, Depressed, Hopeless 1 2 1   PHQ - 2 Score 4 4 3   Altered sleeping 3 2 1   Tired, decreased energy 3 3 2   Change in appetite 0 0 1  Feeling bad or failure about yourself  0 2 0  Trouble concentrating 2 1 3   Moving slowly or fidgety/restless 0 0 0  Suicidal thoughts 0 0 0  PHQ-9 Score 12 12 10   Difficult doing work/chores Very difficult Very difficult Not difficult at all    Social History   Tobacco Use  . Smoking status: Never Smoker  . Smokeless tobacco: Never Used  Substance Use Topics  . Alcohol use: Yes  . Drug use: No    Review of Systems Per HPI unless specifically indicated above  Family History  Problem Relation Age of Onset  . Diabetes Father   . Diabetes Maternal Grandfather   . Ovarian cancer Paternal Grandmother 41       < age 71, or possibly  cervical CA?  . Diabetes Paternal Grandfather   . Breast cancer Neg Hx   . Colon cancer Neg Hx   . Heart disease Neg Hx         Objective:    Ht 5\' 2"  (1.575 m)   Wt 125 lb (56.7 kg)   BMI 22.86 kg/m   Wt Readings from Last 3 Encounters:  04/05/19 125 lb (56.7 kg)  12/07/18 126 lb (57.2 kg)  10/24/18 131 lb (59.4 kg)    Physical Exam   Note examination was completely remotely via video observation objective data only  Gen - well-appearing, no acute distress or apparent pain, comfortable HEENT -  eyes appear clear without discharge or redness Heart/Lungs - cannot examine virtually - observed no evidence of coughing or labored breathing. Skin - face visible today- no rash Neuro - awake, alert, oriented Psych - not anxious appearing   Results for orders placed or performed in visit on 12/07/18  Cervicovaginal ancillary only  Result Value Ref Range   Bacterial vaginitis Negative for Bacterial Vaginitis Microorganisms    Candida vaginitis **POSITIVE for Candida species** (A)    Chlamydia Negative    Neisseria gonorrhea Negative    Trichomonas Negative       Assessment & Plan:   Problem List Items Addressed This Visit    None    Visit Diagnoses    Cervicitis    -  Primary   Relevant Orders   Ambulatory referral to Obstetrics / Gynecology   Vaginal discharge       Relevant Orders   Ambulatory referral to Obstetrics / Gynecology      No orders of the defined types were placed in this encounter.  Clinically uncertain etiology, seems to have constellation of symptoms, abnormal physical appearance of cervix on reported pelvic exams x2 - despite negative Pap Smear - Fam history is concerning with grandmother cervical vs ovarian cancer younger age - S/p antibiotics for STI from HD and also for Yeast  She may continue current Naproxen rx by urgent care for her knee, may relief some cramping symptoms  Referral to [facility] [place] to HoneywellMelody Shambley CNM (previous OB patient of hers), for GYN complaint of persistent cervicitis- requesting GYN consultation and Ultrasound and further management  Orders Placed This Encounter  Procedures  . Ambulatory referral to Obstetrics / Gynecology    Referral Priority:   Routine    Referral Type:   Consultation    Referral Reason:   Specialty Services Required    Referred to Provider:   Purcell NailsShambley, Melody N, CNM    Requested Specialty:   Obstetrics and Gynecology    Number of Visits Requested:   1    Follow-up: - Return as needed  only 4 weeks, f/u with GYN  Patient verbalizes understanding with the above medical recommendations including the limitation of remote medical advice.  Specific follow-up and call-back criteria were given for patient to follow-up or seek medical care more urgently if needed.  Total duration of direct patient care provided via video conference: 15 minutes   Saralyn PilarAlexander Karamalegos, DO [facility] 04/05/2019, 2:10 PM

## 2019-04-05 NOTE — Patient Instructions (Addendum)
Stay tuned for referral to  Harlow Mares, CNM  [facility] [address], [address] [place], [place] [address] Hours: 8am - 5pm Main: [phone]  If they cannot get you scheduled soon enough - notify us and we can try other locations or see what we can do to help.  Take Naproxen for knee as advised see if it helps some of your pelvic symptoms  Please schedule a Follow-up Appointment to: Return in about 4 weeks (around 05/03/2019), or if symptoms worsen or fail to improve, for abnormal menstrual / cervicitis.  If you have any other questions or concerns, please feel free to call the office or send a message through MyChart. You may also schedule an earlier appointment if necessary.  Additionally, you may be receiving a survey about your experience at our office within a few days to 1 week by e-mail or mail. We value your feedback.  Saralyn Pilar, DO [facility] [place]

## 2019-04-10 ENCOUNTER — Telehealth: Payer: Self-pay

## 2019-04-10 NOTE — Telephone Encounter (Signed)
Coronavirus (COVID-19) Are you at risk?  Are you at risk for the Coronavirus (COVID-19)?  To be considered HIGH RISK for Coronavirus (COVID-19), you have to meet the following criteria:  . Traveled to [place], [place], [place], [place] or [place]; or in the [place] to [place], [place], [place], or [place]; and have fever, cough, and shortness of breath within the last 2 weeks of travel OR . Been in close contact with a person diagnosed with COVID-19 within the last 2 weeks and have fever, cough, and shortness of breath . IF YOU DO NOT MEET THESE CRITERIA, YOU ARE CONSIDERED LOW RISK FOR COVID-19.  What to do if you are HIGH RISK for COVID-19?  . If you are having a medical emergency, call 911. . Seek medical care right away. Before you go to a doctor's office, urgent care or emergency department, call ahead and tell them about your recent travel, contact with someone diagnosed with COVID-19, and your symptoms. You should receive instructions from your physician's office regarding next steps of care.  . When you arrive at healthcare provider, tell the healthcare staff immediately you have returned from visiting [place], [place], [place], [place] or [place]; or traveled in the [place] to [place], [place], [place], or [place]; in the last two weeks or you have been in close contact with a person diagnosed with COVID-19 in the last 2 weeks.   . Tell the health care staff about your symptoms: fever, cough and shortness of breath. . After you have been seen by a medical provider, you will be either: o Tested for (COVID-19) and discharged home on quarantine except to seek medical care if symptoms worsen, and asked to  - Stay home and avoid contact with others until you get your results (4-5 days)  - Avoid travel on public transportation if possible (such as bus, train, or airplane) or o Sent to the Emergency Department by EMS for evaluation, COVID-19 testing, and possible  admission depending on your condition and test results.  What to do if you are LOW RISK for COVID-19?  Reduce your risk of any infection by using the same precautions used for avoiding the common cold or flu:  . Wash your hands often with soap and warm water for at least 20 seconds.  If soap and water are not readily available, use an alcohol-based hand sanitizer with at least 60% alcohol.  . If coughing or sneezing, cover your mouth and nose by coughing or sneezing into the elbow areas of your shirt or coat, into a tissue or into your sleeve (not your hands). . Avoid shaking hands with others and consider head nods or verbal greetings only. . Avoid touching your eyes, nose, or mouth with unwashed hands.  . Avoid close contact with people who are sick. . Avoid places or events with large numbers of people in one location, like concerts or sporting events. . Carefully consider travel plans you have or are making. . If you are planning any travel outside or inside the [place], visit the CDC's Travelers' Health webpage for the latest health notices. . If you have some symptoms but not all symptoms, continue to monitor at home and seek medical attention if your symptoms worsen. . If you are having a medical emergency, call 911.   ADDITIONAL HEALTHCARE OPTIONS FOR PATIENTS  [facility] / e-Visit: [email or web address]         [facility]: [phone]  [facility]   [facility]: [phone]                   [facility]: [phone]   Pre-screen negative, DM.   

## 2019-04-11 ENCOUNTER — Other Ambulatory Visit: Payer: Self-pay

## 2019-04-11 ENCOUNTER — Ambulatory Visit (INDEPENDENT_AMBULATORY_CARE_PROVIDER_SITE_OTHER): Payer: Medicaid Other | Admitting: Obstetrics and Gynecology

## 2019-04-11 ENCOUNTER — Encounter: Payer: Self-pay | Admitting: Obstetrics and Gynecology

## 2019-04-11 VITALS — BP 110/71 | HR 107 | Ht 62.0 in | Wt 126.1 lb

## 2019-04-11 DIAGNOSIS — A599 Trichomoniasis, unspecified: Secondary | ICD-10-CM | POA: Diagnosis not present

## 2019-04-11 DIAGNOSIS — N72 Inflammatory disease of cervix uteri: Secondary | ICD-10-CM | POA: Diagnosis not present

## 2019-04-11 DIAGNOSIS — F4310 Post-traumatic stress disorder, unspecified: Secondary | ICD-10-CM | POA: Diagnosis not present

## 2019-04-11 MED ORDER — METRONIDAZOLE 500 MG PO TABS: 1000.0000 mg | ORAL_TABLET | Freq: Two times a day (BID) | ORAL | 0 refills | Status: DC

## 2019-04-11 NOTE — Patient Instructions (Signed)
Trichomoniasis Trichomoniasis is an STI (sexually transmitted infection) that can affect both women and men. In women, the outer area of the female genitalia (vulva) and the vagina are affected. In men, the penis is mainly affected, but the prostate and other reproductive organs can also be involved. This condition can be treated with medicine. It often has no symptoms (is asymptomatic), especially in men. What are the causes? This condition is caused by an organism called Trichomonas vaginalis. Trichomoniasis most often spreads from person to person (is contagious) through sexual contact. What increases the risk? The following factors may make you more likely to develop this condition:  Having unprotected sexual intercourse.  Having sexual intercourse with a partner who has trichomoniasis.  Having multiple sexual partners.  Having had previous trichomoniasis infections or other STIs. What are the signs or symptoms? In women, symptoms of trichomoniasis include:  Abnormal vaginal discharge that is clear, white, gray, or yellow-green and foamy and has an unusual "fishy" odor.  Itching and irritation of the vagina and vulva.  Burning or pain during urination or sexual intercourse.  Genital redness and swelling. In men, symptoms of trichomoniasis include:  Penile discharge that may be foamy or contain pus.  Pain in the penis. This may happen only when urinating.  Itching or irritation inside the penis.  Burning after urination or ejaculation. How is this diagnosed? In women, this condition may be found during a routine Pap test or physical exam. It may be found in men during a routine physical exam. Your health care provider may perform tests to help diagnose this infection, such as:  Urine tests (men and women).  The following in women: ? Testing the pH of the vagina. ? A vaginal swab test that checks for the Trichomonas vaginalis organism. ? Testing vaginal secretions. Your  health care provider may test you for other STIs, including HIV (human immunodeficiency virus). How is this treated? This condition is treated with medicine taken by mouth (orally), such as metronidazole or tinidazole to fight the infection. Your sexual partner(s) may also need to be tested and treated.  If you are a woman and you plan to become pregnant or think you may be pregnant, tell your health care provider right away. Some medicines that are used to treat the infection should not be taken during pregnancy. Your health care provider may recommend over-the-counter medicines or creams to help relieve itching or irritation. You may be tested for infection again 3 months after treatment. Follow these instructions at home:  Take and use over-the-counter and prescription medicines, including creams, only as told by your health care provider.  Do not have sexual intercourse until one week after you finish your medicine, or until your health care provider approves. Ask your health care provider when you may resume sexual intercourse.  (Women) Do not douche or wear tampons while you have the infection.  Discuss your infection with your sexual partner(s). Make sure that your partner gets tested and treated, if necessary.  Keep all follow-up visits as told by your health care provider. This is important. How is this prevented?  Use condoms every time you have sex. Using condoms correctly and consistently can help protect against STIs.  Avoid having multiple sexual partners.  Talk with your sexual partner about any symptoms that either of you may have, as well as any history of STIs.  Get tested for STIs and STDs (sexually transmitted diseases) before you have sex. Ask your partner to do the same.    Do not have sexual contact if you have symptoms of trichomoniasis or another STI. Contact a health care provider if:  You still have symptoms after you finish your medicine.  You develop pain in  your abdomen.  You have pain when you urinate.  You have bleeding after sexual intercourse.  You develop a rash.  You feel nauseous or you vomit.  You plan to become pregnant or think you may be pregnant. Summary  Trichomoniasis is an STI (sexually transmitted infection) that can affect both women and men.  This condition often has no symptoms (is asymptomatic), especially in men.  You should not have sexual intercourse until one week after you finish your medicine, or until your health care provider approves. Ask your health care provider when you may resume sexual intercourse.  Discuss your infection with your sexual partner. Make sure that your partner gets tested and treated, if necessary. This information is not intended to replace advice given to you by your health care provider. Make sure you discuss any questions you have with your health care provider. Document Released: 05/03/2001 Document Revised: 09/30/2016 Document Reviewed: 09/30/2016 Elsevier Interactive Patient Education  2019 Elsevier Inc.  

## 2019-04-11 NOTE — Progress Notes (Signed)
  Subjective:     Patient ID: [name], female   DOB: March 20, 1986, 33 y.o.   MRN: [id number]  HPI States her cervix looks inflamed at previous visit, and negative STD labs. States she is having pelvic pain and camping. Occasional pain with sex on deep penetration. Using condoms with current partner. Menses are normal and denies any other changes. Pap is UTD and was negative. 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  Review of Systems  Genitourinary: Positive for dyspareunia.  All other systems reviewed and are negative.      Objective:   Physical Exam A&Ox4 Well groomed female in no distress Blood pressure 110/71, pulse (!) 107, height 5\' 2"  (1.575 m), weight 126 lb 1.6 oz (57.2 kg), last menstrual period 03/21/2019. Pelvic exam: normal external genitalia, vulva, vagina, cervix, uterus and adnexa, CERVIX: normal appearing cervix without discharge or lesions, ectropion cetralized and evulsed, cervical motion tenderness absent, multiparous os, WET MOUNT done - results: trichomonads, white blood cells.     Assessment:     Cervicitis trichomaniasis    Plan:     Counseled on findings and need for treatment for herself & current partner. RTC in 4 weeks for TOC (may drop off urine if insurance expires before then).  Melody Shambley,CNM

## 2019-04-17 ENCOUNTER — Other Ambulatory Visit: Payer: Self-pay | Admitting: Nurse Practitioner

## 2019-04-17 DIAGNOSIS — J4520 Mild intermittent asthma, uncomplicated: Secondary | ICD-10-CM

## 2019-04-18 DIAGNOSIS — F4310 Post-traumatic stress disorder, unspecified: Secondary | ICD-10-CM | POA: Diagnosis not present

## 2019-04-25 DIAGNOSIS — F4310 Post-traumatic stress disorder, unspecified: Secondary | ICD-10-CM | POA: Diagnosis not present

## 2019-04-26 LAB — NUSWAB VAGINITIS PLUS (VG+)
Candida albicans, NAA: NEGATIVE
Candida glabrata, NAA: NEGATIVE
Chlamydia trachomatis, NAA: NEGATIVE
Neisseria gonorrhoeae, NAA: NEGATIVE
Trich vag by NAA: NEGATIVE

## 2019-05-02 DIAGNOSIS — F4310 Post-traumatic stress disorder, unspecified: Secondary | ICD-10-CM | POA: Diagnosis not present

## 2019-05-08 ENCOUNTER — Telehealth: Payer: Self-pay | Admitting: *Deleted

## 2019-05-08 ENCOUNTER — Telehealth: Payer: Self-pay | Admitting: Obstetrics and Gynecology

## 2019-05-08 NOTE — Telephone Encounter (Signed)
Spoke with pt

## 2019-05-08 NOTE — Telephone Encounter (Signed)
Coronavirus (COVID-19) Are you at risk?  Are you at risk for the Coronavirus (COVID-19)?  To be considered HIGH RISK for Coronavirus (COVID-19), you have to meet the following criteria:  . Traveled to [place], [place], [place], [place] or [place]; or in the [place] to [place], [place], [place], or [place]; and have fever, cough, and shortness of breath within the last 2 weeks of travel OR . Been in close contact with a person diagnosed with COVID-19 within the last 2 weeks and have fever, cough, and shortness of breath . IF YOU DO NOT MEET THESE CRITERIA, YOU ARE CONSIDERED LOW RISK FOR COVID-19.  What to do if you are HIGH RISK for COVID-19?  . If you are having a medical emergency, call 911. . Seek medical care right away. Before you go to a doctor's office, urgent care or emergency department, call ahead and tell them about your recent travel, contact with someone diagnosed with COVID-19, and your symptoms. You should receive instructions from your physician's office regarding next steps of care.  . When you arrive at healthcare provider, tell the healthcare staff immediately you have returned from visiting [place], [place], [place], [place] or [place]; or traveled in the [place] to [place], [place], [place], or [place]; in the last two weeks or you have been in close contact with a person diagnosed with COVID-19 in the last 2 weeks.   . Tell the health care staff about your symptoms: fever, cough and shortness of breath. . After you have been seen by a medical provider, you will be either: o Tested for (COVID-19) and discharged home on quarantine except to seek medical care if symptoms worsen, and asked to  - Stay home and avoid contact with others until you get your results (4-5 days)  - Avoid travel on public transportation if possible (such as bus, train, or airplane) or o Sent to the Emergency Department by EMS for evaluation, COVID-19 testing, and possible  admission depending on your condition and test results.  What to do if you are LOW RISK for COVID-19?  Reduce your risk of any infection by using the same precautions used for avoiding the common cold or flu:  . Wash your hands often with soap and warm water for at least 20 seconds.  If soap and water are not readily available, use an alcohol-based hand sanitizer with at least 60% alcohol.  . If coughing or sneezing, cover your mouth and nose by coughing or sneezing into the elbow areas of your shirt or coat, into a tissue or into your sleeve (not your hands). . Avoid shaking hands with others and consider head nods or verbal greetings only. . Avoid touching your eyes, nose, or mouth with unwashed hands.  . Avoid close contact with people who are sick. . Avoid places or events with large numbers of people in one location, like concerts or sporting events. . Carefully consider travel plans you have or are making. . If you are planning any travel outside or inside the [place], visit the CDC's Travelers' Health webpage for the latest health notices. . If you have some symptoms but not all symptoms, continue to monitor at home and seek medical attention if your symptoms worsen. . If you are having a medical emergency, call 911.   ADDITIONAL HEALTHCARE OPTIONS FOR PATIENTS  [facility] / e-Visit: [email or web address]         [facility]: [phone]  [facility]: [phone]                   [facility]: [phone]   Spoke with pt denies any sx.   , CMA 

## 2019-05-08 NOTE — Telephone Encounter (Signed)
The patient called and stated that she missed a call to do covid-19 prescreening. The patient is requesting a call back.

## 2019-05-09 ENCOUNTER — Ambulatory Visit: Payer: Medicaid Other | Admitting: Obstetrics and Gynecology

## 2019-05-09 ENCOUNTER — Encounter: Payer: Self-pay | Admitting: Obstetrics and Gynecology

## 2019-05-09 ENCOUNTER — Other Ambulatory Visit: Payer: Self-pay

## 2019-05-09 VITALS — BP 103/69 | HR 91 | Ht 62.0 in | Wt 122.3 lb

## 2019-05-09 DIAGNOSIS — F4310 Post-traumatic stress disorder, unspecified: Secondary | ICD-10-CM | POA: Diagnosis not present

## 2019-05-09 DIAGNOSIS — N72 Inflammatory disease of cervix uteri: Secondary | ICD-10-CM

## 2019-05-09 NOTE — Progress Notes (Signed)
  Subjective:     Patient ID: [name], female   DOB: October 30, 1986, 33 y.o.   MRN: [id number]  HPI Seen previously on 04/11/2019 and treated for trich, here for TOC.  Review of Systems     Objective:   Physical Exam A&Ox4 Well groomed female in no distress Blood pressure 103/69, pulse 91, height 5\' 2"  (1.575 m), weight 122 lb 4.8 oz (55.5 kg), last menstrual period 04/08/2019. Pelvic exam: VULVA: normal appearing vulva with no masses, tenderness or lesions, VAGINA: normal appearing vagina with normal color and discharge, no lesions, WET MOUNT done - results: negative for pathogens, normal epithelial cells.    Assessment:     Trich resolved    Plan:     Reassured of normal findings. RTC as needed.  Yama Nielson,CNM

## 2019-05-16 DIAGNOSIS — F4310 Post-traumatic stress disorder, unspecified: Secondary | ICD-10-CM | POA: Diagnosis not present

## 2019-05-23 DIAGNOSIS — F4310 Post-traumatic stress disorder, unspecified: Secondary | ICD-10-CM | POA: Diagnosis not present

## 2019-06-06 DIAGNOSIS — F4310 Post-traumatic stress disorder, unspecified: Secondary | ICD-10-CM | POA: Diagnosis not present

## 2019-06-11 ENCOUNTER — Other Ambulatory Visit: Payer: Self-pay | Admitting: Nurse Practitioner

## 2019-06-11 DIAGNOSIS — R0602 Shortness of breath: Secondary | ICD-10-CM

## 2019-06-13 DIAGNOSIS — F4310 Post-traumatic stress disorder, unspecified: Secondary | ICD-10-CM | POA: Diagnosis not present

## 2019-06-20 DIAGNOSIS — F4310 Post-traumatic stress disorder, unspecified: Secondary | ICD-10-CM | POA: Diagnosis not present

## 2019-07-04 DIAGNOSIS — F4310 Post-traumatic stress disorder, unspecified: Secondary | ICD-10-CM | POA: Diagnosis not present

## 2019-07-08 ENCOUNTER — Other Ambulatory Visit: Payer: Self-pay | Admitting: Family Medicine

## 2019-07-08 DIAGNOSIS — J4520 Mild intermittent asthma, uncomplicated: Secondary | ICD-10-CM

## 2019-07-10 ENCOUNTER — Encounter: Payer: Self-pay | Admitting: Nurse Practitioner

## 2019-07-10 ENCOUNTER — Other Ambulatory Visit: Payer: Self-pay

## 2019-07-10 ENCOUNTER — Ambulatory Visit (INDEPENDENT_AMBULATORY_CARE_PROVIDER_SITE_OTHER): Payer: Medicaid Other | Admitting: Nurse Practitioner

## 2019-07-10 DIAGNOSIS — S049XXD Injury of unspecified cranial nerve, subsequent encounter: Secondary | ICD-10-CM

## 2019-07-10 DIAGNOSIS — R300 Dysuria: Secondary | ICD-10-CM | POA: Diagnosis not present

## 2019-07-10 MED ORDER — GABAPENTIN 100 MG PO CAPS: ORAL_CAPSULE | ORAL | 1 refills | Status: DC

## 2019-07-10 MED ORDER — CEPHALEXIN 500 MG PO CAPS: 500.0000 mg | ORAL_CAPSULE | Freq: Three times a day (TID) | ORAL | 0 refills | Status: AC
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
## 2019-07-17 ENCOUNTER — Encounter: Payer: Self-pay | Admitting: Nurse Practitioner

## 2019-07-18 DIAGNOSIS — F4310 Post-traumatic stress disorder, unspecified: Secondary | ICD-10-CM | POA: Diagnosis not present

## 2019-08-01 DIAGNOSIS — F4310 Post-traumatic stress disorder, unspecified: Secondary | ICD-10-CM | POA: Diagnosis not present

## 2019-08-05 ENCOUNTER — Other Ambulatory Visit: Payer: Self-pay | Admitting: Nurse Practitioner

## 2019-08-05 DIAGNOSIS — J4520 Mild intermittent asthma, uncomplicated: Secondary | ICD-10-CM

## 2019-08-08 DIAGNOSIS — F4310 Post-traumatic stress disorder, unspecified: Secondary | ICD-10-CM | POA: Diagnosis not present

## 2019-08-14 DIAGNOSIS — F4310 Post-traumatic stress disorder, unspecified: Secondary | ICD-10-CM | POA: Diagnosis not present

## 2019-08-22 DIAGNOSIS — F4310 Post-traumatic stress disorder, unspecified: Secondary | ICD-10-CM | POA: Diagnosis not present

## 2019-08-29 DIAGNOSIS — F4310 Post-traumatic stress disorder, unspecified: Secondary | ICD-10-CM | POA: Diagnosis not present

## 2019-09-02 ENCOUNTER — Other Ambulatory Visit: Payer: Self-pay | Admitting: Nurse Practitioner

## 2019-09-02 DIAGNOSIS — S049XXD Injury of unspecified cranial nerve, subsequent encounter: Secondary | ICD-10-CM

## 2019-09-05 DIAGNOSIS — F4310 Post-traumatic stress disorder, unspecified: Secondary | ICD-10-CM | POA: Diagnosis not present

## 2019-09-12 DIAGNOSIS — F4310 Post-traumatic stress disorder, unspecified: Secondary | ICD-10-CM | POA: Diagnosis not present

## 2019-09-19 DIAGNOSIS — F4310 Post-traumatic stress disorder, unspecified: Secondary | ICD-10-CM | POA: Diagnosis not present

## 2019-09-26 DIAGNOSIS — F4310 Post-traumatic stress disorder, unspecified: Secondary | ICD-10-CM | POA: Diagnosis not present

## 2019-10-01 ENCOUNTER — Encounter: Payer: Self-pay | Admitting: Family Medicine

## 2019-10-01 ENCOUNTER — Encounter: Payer: Self-pay | Admitting: Neurology

## 2019-10-01 ENCOUNTER — Other Ambulatory Visit: Payer: Self-pay

## 2019-10-01 ENCOUNTER — Ambulatory Visit (INDEPENDENT_AMBULATORY_CARE_PROVIDER_SITE_OTHER): Payer: Medicaid Other | Admitting: Family Medicine

## 2019-10-01 VITALS — BP 106/66 | HR 98 | Temp 98.5°F | Resp 14 | Ht 62.0 in | Wt 123.8 lb

## 2019-10-01 DIAGNOSIS — G43709 Chronic migraine without aura, not intractable, without status migrainosus: Secondary | ICD-10-CM | POA: Diagnosis not present

## 2019-10-01 DIAGNOSIS — Z87820 Personal history of traumatic brain injury: Secondary | ICD-10-CM | POA: Diagnosis not present

## 2019-10-01 DIAGNOSIS — R413 Other amnesia: Secondary | ICD-10-CM | POA: Diagnosis not present

## 2019-10-01 DIAGNOSIS — M7552 Bursitis of left shoulder: Secondary | ICD-10-CM

## 2019-10-01 DIAGNOSIS — Z23 Encounter for immunization: Secondary | ICD-10-CM

## 2019-10-01 MED ORDER — BACLOFEN 10 MG PO TABS: 10.0000 mg | ORAL_TABLET | Freq: Every evening | ORAL | 1 refills | Status: DC | PRN

## 2019-10-01 MED ORDER — NAPROXEN 500 MG PO TABS: 500.0000 mg | ORAL_TABLET | Freq: Two times a day (BID) | ORAL | 1 refills | Status: DC

## 2019-10-01 NOTE — Patient Instructions (Addendum)
Thank you for coming to the office today.  Referral back to [facility] Neurology Dr Tomi Likens [facility])  Most likely you have bursitis of your shoulder. This is inflammation of the shoulder joint caused most often by arthritis or wear and tear. Often it can flare up to cause bursitis due to repetitive activities or other triggers. It may take time to heal, possibly 2 to 6 weeks, and it is important to avoid over use of shoulder especially above head motions that can re-aggravate the problem.    Baclofen refilled as needed  Recommend trial of Anti-inflammatory with Naproxen (Naprosyn) 500mg  tabs - take one with food and plenty of water TWICE daily every day (breakfast and dinner), for next 1 to 2 weeks, then you may take only as needed  - DO NOT TAKE any ibuprofen, aleve, motrin while you are taking this medicine  Recommend to start taking Tylenol Extra Strength 500mg  tabs - take 1 to 2 tabs per dose (max 1000mg ) every 6-8 hours for pain (take regularly, don't skip a dose for next 7 days), max 24 hour daily dose is 6 tablets or 3000mg . In the future you can repeat the same everyday Tylenol course for 1-2 weeks at a time.   If not improving as expected or keeps waking you up - call our office 1-2 weeks - we can add a muscle relaxant to help sleep.  Also we can refer you to Physical Therapy if just need to improve on range of motion.  Lastly if not improved by about 4-6 weeks - or just need treatment sooner - call and return for a Steroid Injection in shoulder.   Please schedule a Follow-up Appointment to: No follow-ups on file.  If you have any other questions or concerns, please feel free to call the office or send a message through [place]. You may also schedule an earlier appointment if necessary.  Additionally, you may be receiving a survey about your experience at our office within a few days to 1 week by e-mail or mail. We value your feedback.  Nobie Putnam, DO [facility]  Range of Motion Shoulder Exercises  [place] with your good arm against a counter or table for support [facility] forward with a wide stance (make sure your body is comfortable) - Your painful shoulder should hang down and feel "heavy" - Gently move your painful arm in small circles "clockwise" for several turns - Switch to "counterclockwise" for several turns - Early on keep circles narrow and move slowly - Later in rehab, move in larger circles and faster movement   Wall Crawl - Stand close (about 1-2 ft away) to a wall, facing it directly - Reach out with your arm of painful shoulder and place fingers (not palm) on wall - You should make contact with wall at your waist level - Slowly walk your fingers up the wall. Stay in contact with wall entire time, do not remove fingers - Keep walking fingers up wall until you reach shoulder level - You may feel tightening or mild discomfort, once you reach a height that causes pain or if you are already above your shoulder height then stop. Repeat from starting position. - Early on stand closer to wall, move fingers slowly, and stay at or below shoulder level - Later in rehab, stand farther away from wall (fingertips), move fingers quicker, go above shoulder level
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
## 2019-10-02 ENCOUNTER — Other Ambulatory Visit: Payer: Self-pay | Admitting: Nurse Practitioner

## 2019-10-02 DIAGNOSIS — R0602 Shortness of breath: Secondary | ICD-10-CM

## 2019-10-02 DIAGNOSIS — J4520 Mild intermittent asthma, uncomplicated: Secondary | ICD-10-CM

## 2019-10-03 DIAGNOSIS — F4310 Post-traumatic stress disorder, unspecified: Secondary | ICD-10-CM | POA: Diagnosis not present

## 2019-10-19 ENCOUNTER — Encounter: Payer: Self-pay | Admitting: Gynecology

## 2019-10-19 ENCOUNTER — Ambulatory Visit
Admission: EM | Admit: 2019-10-19 | Discharge: 2019-10-19 | Disposition: A | Discharge status: Home / Self Care | Payer: Medicaid Other | Attending: Emergency Medicine | Admitting: Emergency Medicine

## 2019-10-19 ENCOUNTER — Other Ambulatory Visit: Payer: Self-pay

## 2019-10-19 DIAGNOSIS — J020 Streptococcal pharyngitis: Secondary | ICD-10-CM | POA: Diagnosis not present

## 2019-10-19 LAB — RAPID STREP SCREEN (MED CTR MEBANE ONLY): Streptococcus, Group A Screen (Direct): POSITIVE — AB

## 2019-10-19 MED ORDER — AZITHROMYCIN 250 MG PO TABS: ORAL_TABLET | ORAL | 0 refills | Status: DC

## 2019-10-19 NOTE — Discharge Instructions (Addendum)
Take medication as prescribed. Rest. Drink plenty of fluids. Over the counter tylenol and ibuprofen.   Follow up with your primary care physician this week as needed. Return to Urgent care for new or worsening concerns.

## 2019-10-19 NOTE — ED Triage Notes (Signed)
Per patient with sore throat and body ache. Per patient refuse covid testing. Patient had a fever of 100.1. Rash on left arm notice few days ago.

## 2019-10-19 NOTE — ED Provider Notes (Signed)
[facility] ____________________________________________  Time seen: Approximately 12:10 PM  I have reviewed the triage vital signs and the nursing notes.   HISTORY  Chief Complaint Sore Throat and bodyache   HPI [name] is a 33 y.o. female presenting for evaluation of sore throat present for the last 2 days.  States some body aches.  Denies known fevers.  Has not been taking Tylenol or ibuprofen.  Denies cough, congestion, changes in taste or smell, chest pain, shortness of breath, vomiting, diarrhea.  Has overall continued to eat and drink well.  Denies home sick contacts.  Reports potential work sick contacts.  Denies known COVID-19 exposures.  Denies other aggravating alleviating factors.  States sore throat is moderate and feels like razor blades, states feels like previous strep throat infections.  Patient's last menstrual period was 10/05/2019.  Mikey College, NP (Inactive) : PCP   Past Medical History:  Diagnosis Date  . Anxiety   . Anxiety and depression   . Bleeding nose   . Depression   . 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 Hochberg today for follow up regarding left inguinal adenopathy with history of right caseating granulomatous lymphadenitis with resection in 2013. She saw Stark Bray in our department two weeks ago. Her case was discussed with Dr Patrice Paradise with [facility] ID and  differential diagnosis includes non-infectious disease process (ex. Sarcoidosis, malignancy), infectious disease (ex. viral or bacterial, including TB), fungal infection (ex. Blastomycosis, coccidioidomycosis, histoplasmosis). She was scheduled to see surgical oncology for a possible biopsy, but missed that appointment. She has appt with Dr. Bayard Hugger on 03/26/14.  We reviewed the results of her evaluation thus far:  03/03/14 CXR wnl 03/03/14 quantiferon TB test neg 03/03/14 CBC WBC 14 (wnl for pregnancy) 03/03/14 fungal antibodies negative  03/03/14  sed rate 28 and CRP 1.9 (mildly elevated although consistent with pregnancy)  She does report a dramatic improvement in the size of the left lymph node since completing a course  . Headache, tension-type   . Medical history non-contributory   . Need for Tdap vaccination    at 28 weeks    Patient Active Problem List   Diagnosis Date Noted  . Genital herpes 11/05/2018  . Anxiety and depression 12/29/2017  . Chronic migraine without aura without status migrainosus, not intractable 01/18/2016  . Migraine without aura and without status migrainosus, not intractable 12/28/2015  . Paresthesia 12/28/2015  . Ovarian cyst 12/18/2015  . 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 Vaginal delivery 09/04/2015  . Labor and delivery, indication for care 08/01/2015  . Pregnancy 04/28/2015    Past Surgical History:  Procedure Laterality Date  . LAPAROSCOPY  2010  . LYMPHADENECTOMY Right    "size of a baseball"     No current facility-administered medications for this encounter.   Current Outpatient Medications:  .  baclofen (LIORESAL) 10 MG tablet, Take 1 tablet (10 mg total) by mouth at bedtime as needed for muscle spasms (shoulder pain)., Disp: 30 tablet, Rfl: 1 .  DULERA 200-5 MCG/ACT AERO, TAKE 2 PUFFS BY MOUTH TWICE A DAY, Disp: 13 g, Rfl: 1 .  gabapentin (NEURONTIN) 100 MG capsule, TAKE 100 MG (1 CAPSULES) AT BEDTIME FOR 7 DAYS. THEN TAKE 100 MG (1 CAPSULES) IN MORNING AND AT BEDTIME FOR 7 DAYS. THEN IF NEEDED, INCREASE BEDTIME DOSE TO 200 MG (2 CAPSULES) AT BEDTIME AND CONTINUE., Disp: 90 capsule, Rfl: 1 .  JUNEL FE 1/20 1-20 MG-MCG tablet, TAKE 1  TABLET DAILY BY MOUTH, Disp: 28 tablet, Rfl: 12 .  loratadine (CLARITIN) 10 MG tablet, Take 10 mg by mouth daily., Disp: , Rfl:  .  montelukast (SINGULAIR) 10 MG tablet, TAKE 1 TABLET BY MOUTH EVERYDAY AT BEDTIME, Disp: 30 tablet, Rfl: 3 .  naproxen (NAPROSYN) 500 MG tablet, Take 1 tablet (500 mg total) by mouth 2 (two) times daily with a meal. For 2-4 weeks  then as needed, Disp: 60 tablet, Rfl: 1 .  albuterol (PROVENTIL HFA;VENTOLIN HFA) 108 (90 Base) MCG/ACT inhaler, Inhale 1-2 puffs into the lungs every 6 (six) hours as needed for shortness of breath., Disp: 1 Inhaler, Rfl: 2 .  azithromycin (ZITHROMAX Z-PAK) 250 MG tablet, Take 2 tablets (500 mg) on  Day 1,  followed by 1 tablet (250 mg) once daily on Days 2 through 5., Disp: 6 each, Rfl: 0  Allergies Penicillins and Latex  Family History  Problem Relation Age of Onset  . Diabetes Father   . Diabetes Maternal Grandfather   . Ovarian cancer Paternal Grandmother 18       < age 55, or possibly cervical CA?  . Diabetes Paternal Grandfather   . Breast cancer Neg Hx   . Colon cancer Neg Hx   . Heart disease Neg Hx     Social History Social History   Tobacco Use  . Smoking status: Never Smoker  . Smokeless tobacco: Never Used  Substance Use Topics  . Alcohol use: Yes  . Drug use: No    Review of Systems Constitutional: No fever ENT: Positive sore throat. Cardiovascular: Denies chest pain. Respiratory: Denies shortness of breath. Gastrointestinal: No abdominal pain.  No nausea, no vomiting.  No diarrhea.   Musculoskeletal: Negative for back pain. Skin: Negative for rash.   ____________________________________________   PHYSICAL EXAM:  VITAL SIGNS: ED Triage Vitals  Enc Vitals Group     BP 10/19/19 1117 111/74     Pulse Rate 10/19/19 1117 (!) 130 Recheck 102     Resp 10/19/19 1117 16     Temp 10/19/19 1117 100.1 F (37.8 C)     Temp Source 10/19/19 1117 Oral     SpO2 10/19/19 1117 99 %     Weight 10/19/19 1120 125 lb (56.7 kg)     Height 10/19/19 1120 5\' 2"  (1.575 m)     Head Circumference --      Peak Flow --      Pain Score 10/19/19 1120 7     Pain Loc --      Pain Edu? --      Excl. in GC? --     Constitutional: Alert and oriented. Well appearing and in no acute distress. Eyes: Conjunctivae are normal.  Head: Atraumatic. No sinus tenderness to palpation.  No swelling. No erythema.  Ears: no erythema, normal TMs bilaterally.   Nose:No nasal congestion  Mouth/Throat: Mucous membranes are moist.moderate pharyngeal erythema.  Mild bilateral tonsillar swelling with mild bilateral exudate.  No uvular shift or deviation. Neck: No stridor.  No cervical spine tenderness to palpation. Hematological/Lymphatic/Immunilogical: Anterior bilateral cervical lymphadenopathy. Cardiovascular: Normal rate, regular rhythm. Grossly normal heart sounds.  Good peripheral circulation. Respiratory: Normal respiratory effort.  No retractions. No wheezes, rales or rhonchi. Good air movement.  Musculoskeletal: Ambulatory with steady gait.  Neurologic:  Normal speech and language. No gait instability. Skin:  Skin appears warm, dry and intact. No rash noted. Psychiatric: Mood and affect are normal. Speech and behavior are normal.  ___________________________________________   LABS (  all labs ordered are listed, but only abnormal results are displayed)  Labs Reviewed  RAPID STREP SCREEN ([facility] ONLY) - Abnormal; Notable for the following components:      Result Value   Streptococcus, Group A Screen (Direct) POSITIVE (*)    All other components within normal limits   ____________________________________________  PROCEDURES Procedures     INITIAL IMPRESSION / ASSESSMENT AND PLAN / ED COURSE  Pertinent labs & imaging results that were available during my care of the patient were reviewed by me and considered in my medical decision making (see chart for details).  Well-appearing patient.  No acute distress.  Declined Covid testing, no other symptoms.  Strep positive.  Will treat with oral azithromycin, penicillin allergic.  Encourage rest, fluids, supportive care, over-the-counter Tylenol or Profen as needed.Discussed indication, risks and benefits of medications with patient.   Discussed follow up with Primary care physician this week as needed. Discussed  follow up and return parameters including no resolution or any worsening concerns. Patient verbalized understanding and agreed to plan.   ____________________________________________   FINAL CLINICAL IMPRESSION(S) / ED DIAGNOSES  Final diagnoses:  Strep pharyngitis     ED Discharge Orders         Ordered    azithromycin (ZITHROMAX Z-PAK) 250 MG tablet     10/19/19 1206           Note: This dictation was prepared with Dragon dictation along with smaller phrase technology. Any transcriptional errors that result from this process are unintentional.         Renford DillsMiller, Roxie Kreeger, NP 10/19/19 1215

## 2019-10-28 ENCOUNTER — Other Ambulatory Visit: Payer: Self-pay

## 2019-10-28 ENCOUNTER — Ambulatory Visit (INDEPENDENT_AMBULATORY_CARE_PROVIDER_SITE_OTHER): Payer: Medicaid Other | Admitting: Family Medicine

## 2019-10-28 ENCOUNTER — Other Ambulatory Visit: Payer: Self-pay | Admitting: Nurse Practitioner

## 2019-10-28 DIAGNOSIS — B379 Candidiasis, unspecified: Secondary | ICD-10-CM | POA: Diagnosis not present

## 2019-10-28 DIAGNOSIS — J020 Streptococcal pharyngitis: Secondary | ICD-10-CM

## 2019-10-28 DIAGNOSIS — S049XXD Injury of unspecified cranial nerve, subsequent encounter: Secondary | ICD-10-CM

## 2019-10-28 DIAGNOSIS — T3695XA Adverse effect of unspecified systemic antibiotic, initial encounter: Secondary | ICD-10-CM

## 2019-10-28 MED ORDER — CEPHALEXIN 500 MG PO CAPS: 500.0000 mg | ORAL_CAPSULE | Freq: Two times a day (BID) | ORAL | 0 refills | Status: DC

## 2019-10-28 MED ORDER — PHENOL 1.4 % MT LIQD: 1.0000 | OROMUCOSAL | 0 refills | Status: DC | PRN

## 2019-10-28 MED ORDER — FLUCONAZOLE 150 MG PO TABS: ORAL_TABLET | ORAL | 0 refills | Status: DC

## 2019-10-28 NOTE — Progress Notes (Signed)
Virtual Visit via Telephone The purpose of this virtual visit is to provide medical care while limiting exposure to the novel coronavirus (COVID19) for both patient and office staff.  Consent was obtained for phone visit:  Yes.   Answered questions that patient had about telehealth interaction:  Yes.   I discussed the limitations, risks, security and privacy concerns of performing an evaluation and management service by telephone. I also discussed with the patient that there may be a patient responsible charge related to this service. The patient expressed understanding and agreed to proceed.  Patient Location: Home Provider Location: [facility]   ---------------------------------------------------------------------- Chief Complaint  Patient presents with  . Sore Throat    onset yesterday both ear hurts went to urgent care 11/28 diagnosed with strep pharyngitis, Abx given Sxs resolved and came back from yesterday    S: Reviewed CMA documentation. I have called patient and gathered additional HPI as follows:  Sore Throat / Strep Throat Reports that symptoms started 1 week ago with sore throat and felt sick, difficulty swallowing, ran a fever went to Urgent Care, she had rapid strep POSITIVE test. Treated with Azithromycin due to PCN allergy. She improved but then symptoms returned now after a few day off antibiotic. Now having sore throat again onset yesterday, no fever but concerned it is developing again.  Denies any high risk travel to areas of current concern for COVID19. Denies any known or suspected exposure to person with or possibly with COVID19.  Denies any fevers, chills, sweats, body ache, cough, shortness of breath, sinus pain or pressure, headache, abdominal pain, diarrhea  -------------------------------------------------------------------------- O: No physical exam performed due to remote telephone  encounter.  -------------------------------------------------------------------------- A&P:   Suspected recurrent acute strep throat infection based on history, prior positive on 10/19/19 rapid strep positive at [facility]. No known strep or sick contacts.  No other localized infection  Improve on Azithromycin but may not have fully resolved.  She has PCN allergy but has tolerated Keflex without problem, took it in 06/2019 for UTI tolerated well  Plan: 1. Start Keflex 500mg  BID x 10 days 2. Rx Chlorospetic Spray 3. Symptomatic control with NSAID / Tylenol regularly then PRN 4. Improve hydration, advance diet, warm herbal tea with honey 5. Follow-up within 1 week if worsening   Meds ordered this encounter  Medications  . cephALEXin (KEFLEX) 500 MG capsule    Sig: Take 1 capsule (500 mg total) by mouth 2 (two) times daily. For 10 days    Dispense:  20 capsule    Refill:  0  . phenol (CHLORASEPTIC) 1.4 % LIQD    Sig: Use as directed 1 spray in the mouth or throat as needed for throat irritation / pain.    Dispense:  118 mL    Refill:  0  . fluconazole (DIFLUCAN) 150 MG tablet    Sig: Take one tablet by mouth on Day 1. Repeat dose 2nd tablet on Day 3.    Dispense:  2 tablet    Refill:  0      If symptoms do not resolve or significantly improve OR if WORSENING - fever / cough - or worsening shortness of breath - then should contact and seek advice on next steps in treatment at home vs where/when to seek care at Urgent Care or Hospital ED for further intervention and possible testing if indicated.  Patient verbalizes understanding with the above medical recommendations including the limitation of remote medical advice.  Specific follow-up / call-back criteria were given for patient to follow-up or seek medical care more urgently if needed.   - Time spent in direct consultation with patient on phone: 8 minutes   Nobie Putnam, [facility] 10/28/2019, 10:39 AM

## 2019-10-28 NOTE — Patient Instructions (Addendum)
Start Keflex antibiotic 500mg  twice a day for 10 days, since strep did not seem to resolve.  Follow-up sooner as needed if not improving  Please schedule a Follow-up Appointment to: Return in about 1 week (around 11/04/2019), or if symptoms worsen or fail to improve, for strep.  If you have any other questions or concerns, please feel free to call the office or send a message through [place]. You may also schedule an earlier appointment if necessary.  Additionally, you may be receiving a survey about your experience at our office within a few days to 1 week by e-mail or mail. We value your feedback.  Nobie Putnam, DO [place]

## 2019-10-31 DIAGNOSIS — F4310 Post-traumatic stress disorder, unspecified: Secondary | ICD-10-CM | POA: Diagnosis not present

## 2019-11-07 DIAGNOSIS — F4310 Post-traumatic stress disorder, unspecified: Secondary | ICD-10-CM | POA: Diagnosis not present

## 2019-11-08 ENCOUNTER — Encounter: Payer: Self-pay | Admitting: Neurology
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
## 2019-11-12 ENCOUNTER — Telehealth (INDEPENDENT_AMBULATORY_CARE_PROVIDER_SITE_OTHER): Payer: Medicaid Other | Admitting: Neurology

## 2019-11-12 ENCOUNTER — Other Ambulatory Visit: Payer: Self-pay

## 2019-11-12 ENCOUNTER — Telehealth: Payer: Self-pay | Admitting: Neurology

## 2019-11-12 DIAGNOSIS — R413 Other amnesia: Secondary | ICD-10-CM | POA: Diagnosis not present

## 2019-11-12 DIAGNOSIS — R202 Paresthesia of skin: Secondary | ICD-10-CM

## 2019-11-12 DIAGNOSIS — G43009 Migraine without aura, not intractable, without status migrainosus: Secondary | ICD-10-CM

## 2019-11-12 DIAGNOSIS — G44329 Chronic post-traumatic headache, not intractable: Secondary | ICD-10-CM

## 2019-11-12 MED ORDER — NORTRIPTYLINE HCL 10 MG PO CAPS: 10.0000 mg | ORAL_CAPSULE | Freq: Every day | ORAL | 3 refills | Status: DC

## 2019-11-12 NOTE — Telephone Encounter (Signed)
faxed

## 2019-11-12 NOTE — Telephone Encounter (Signed)
Patient returned a call back to the office for the nurse with a Fax number [phone]. Thank you

## 2019-11-25 ENCOUNTER — Other Ambulatory Visit: Payer: Self-pay | Admitting: Family Medicine

## 2019-11-25 DIAGNOSIS — M7552 Bursitis of left shoulder: Secondary | ICD-10-CM

## 2019-11-27 ENCOUNTER — Ambulatory Visit
Admission: RE | Admit: 2019-11-27 | Discharge: 2019-11-27 | Disposition: A | Discharge status: Home / Self Care | Payer: Medicaid Other | Source: Ambulatory Visit | Attending: Neurology | Admitting: Neurology

## 2019-11-27 ENCOUNTER — Other Ambulatory Visit: Payer: Self-pay

## 2019-11-27 DIAGNOSIS — R519 Headache, unspecified: Secondary | ICD-10-CM | POA: Diagnosis not present

## 2019-11-27 DIAGNOSIS — G43009 Migraine without aura, not intractable, without status migrainosus: Secondary | ICD-10-CM

## 2019-11-27 DIAGNOSIS — R413 Other amnesia: Secondary | ICD-10-CM | POA: Diagnosis not present

## 2019-11-27 IMAGING — MR MR HEAD W/O CM
11 series · 48 of 48 positions shown · non-contrast
Comparison: None.

CLINICAL DATA: Headaches and memory loss. Duration of symptoms
several months.

EXAM:
MRI HEAD WITHOUT CONTRAST
TECHNIQUE: Multiplanar, multiecho pulse sequences of the brain and surrounding
structures were obtained without intravenous contrast.

[Series 2: T1 · sagittal · 5.0mm · 0.45mm/px · 2 of 24 slices shown]
[im 1/24]
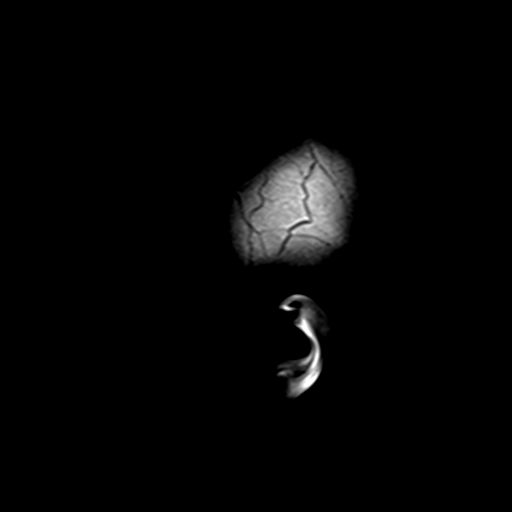
[im 24/24]
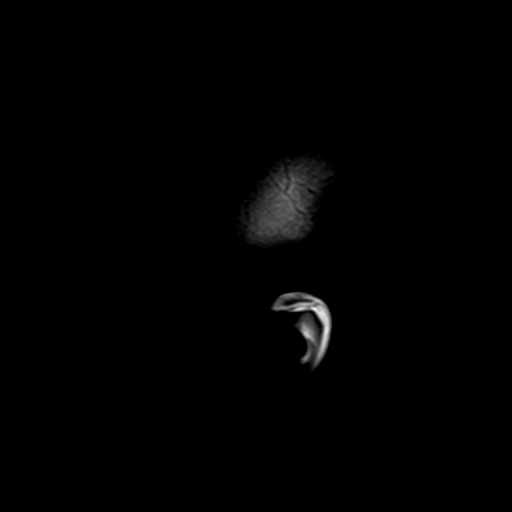

[Series 3: DWI · axial · 3.0mm · 1.80mm/px · z∈[-62,+95]mm · 8 of 108 slices shown (1 of 4)]
[im 1/108]
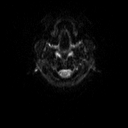
[im 16/108]
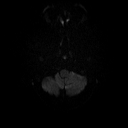
[im 31/108]
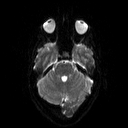
[im 46/108]
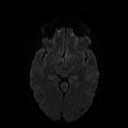
[im 62/108]
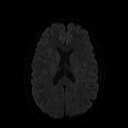
[im 77/108]
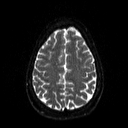
[im 92/108]
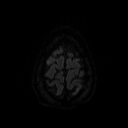
[im 108/108]
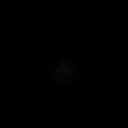

[Series 4: DWI · axial · 3.0mm · 1.80mm/px · z∈[-62,+95]mm · 4 of 52 slices shown (2 of 4)]
[im 1/52]
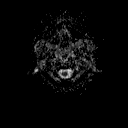
[im 18/52]
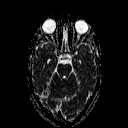
[im 35/52]
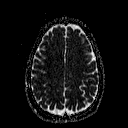
[im 52/52]
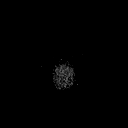

[Series 5: FLAIR · axial · 3.0mm · 0.45mm/px · z∈[-61,+95]mm · 3 of 35 slices shown (1 of 2)]
[im 1/35]
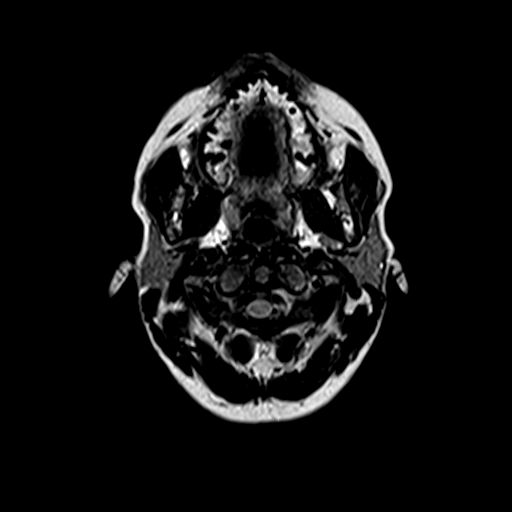
[im 18/35]
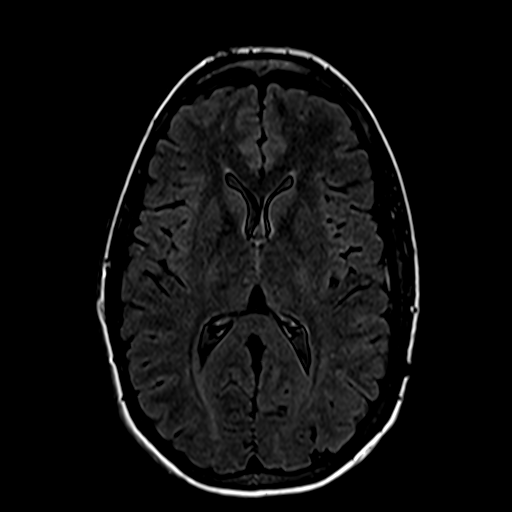
[im 35/35]
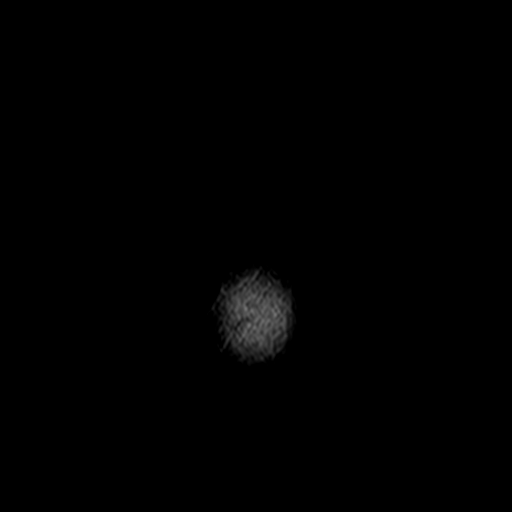

[Series 6: T2 · axial · 5.0mm · 0.60mm/px · z∈[-58,+95]mm · 2 of 24 slices shown (1 of 2)]
[im 1/24]
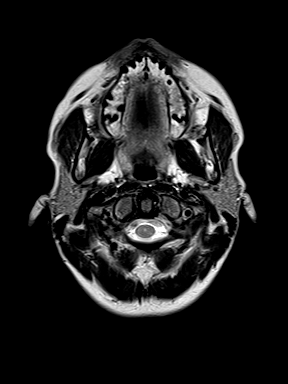
[im 24/24]
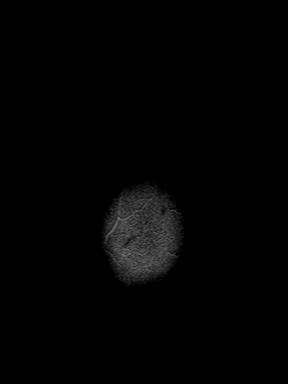

[Series 8: swi_images · axial · 5.0mm · 0.90mm/px · z∈[-59,+95]mm · 3 of 32 slices shown]
[im 1/32]
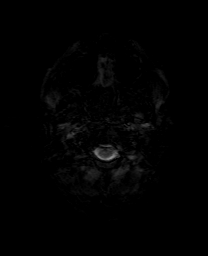
[im 16/32]
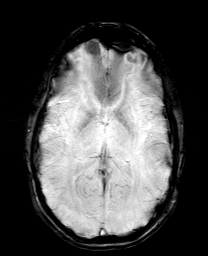
[im 32/32]
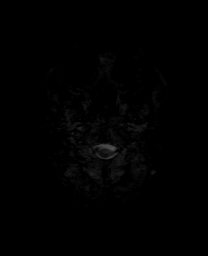

[Series 9: t1_mpr_tra · axial · 1.0mm · 0.75mm/px · z∈[-61,+96]mm · 13 of 160 slices shown]
[im 1/160]
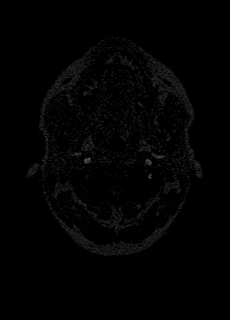
[im 14/160]
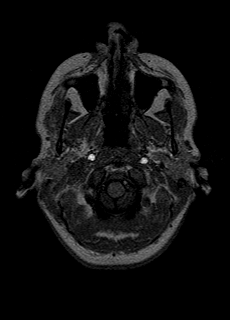
[im 27/160]
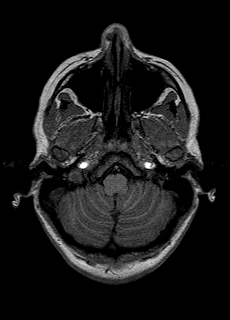
[im 40/160]
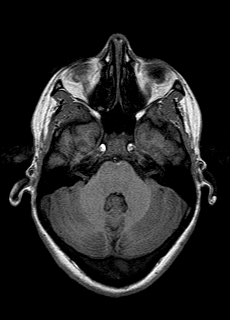
[im 54/160]
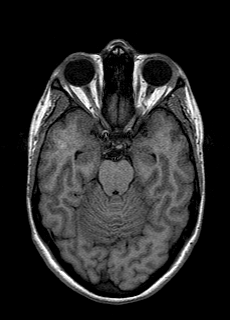
[im 67/160]
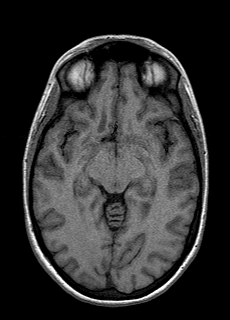
[im 80/160]
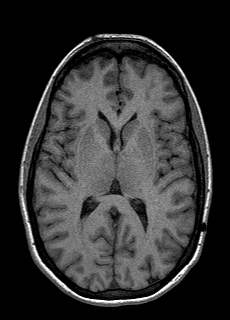
[im 93/160]
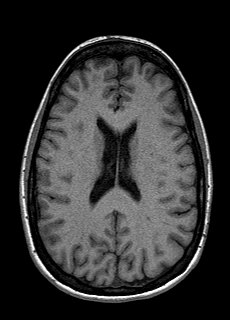
[im 107/160]
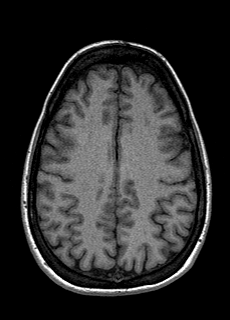
[im 120/160]
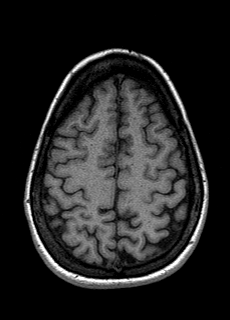
[im 133/160]
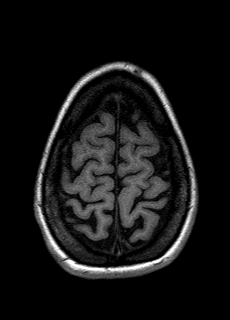
[im 146/160]
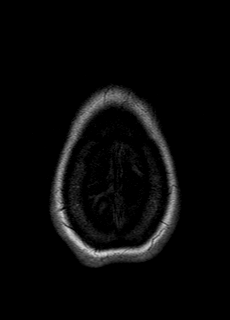
[im 160/160]
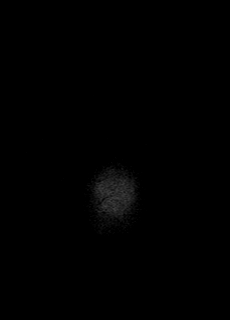

[Series 10: DWI · coronal · 5.0mm · 1.80mm/px · 6 of 80 slices shown (3 of 4)]
[im 1/80]
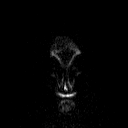
[im 16/80]
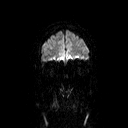
[im 32/80]
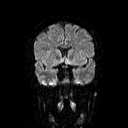
[im 48/80]
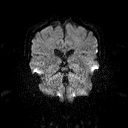
[im 64/80]
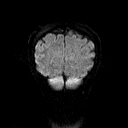
[im 80/80]
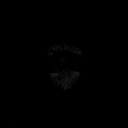

[Series 11: DWI · coronal · 5.0mm · 1.80mm/px · 3 of 40 slices shown (4 of 4)]
[im 1/40]
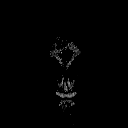
[im 20/40]
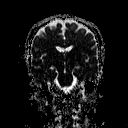
[im 40/40]
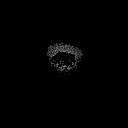

[Series 12: T2 · coronal · 5.0mm · 0.45mm/px · 2 of 31 slices shown (2 of 2)]
[im 1/31]
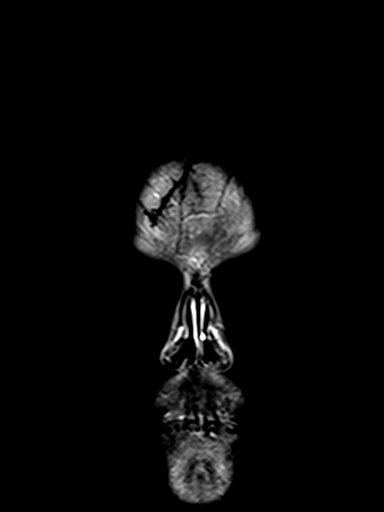
[im 31/31]
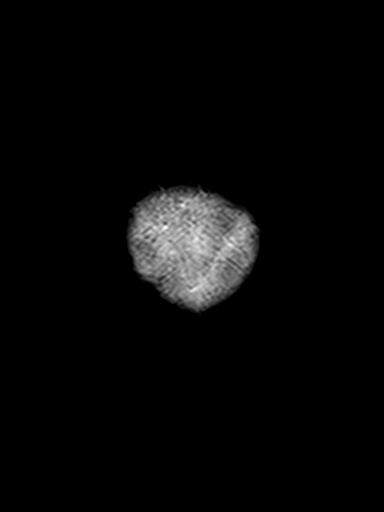

[Series 13: FLAIR · sagittal · 5.0mm · 0.45mm/px · 2 of 28 slices shown (2 of 2)]
[im 1/28]
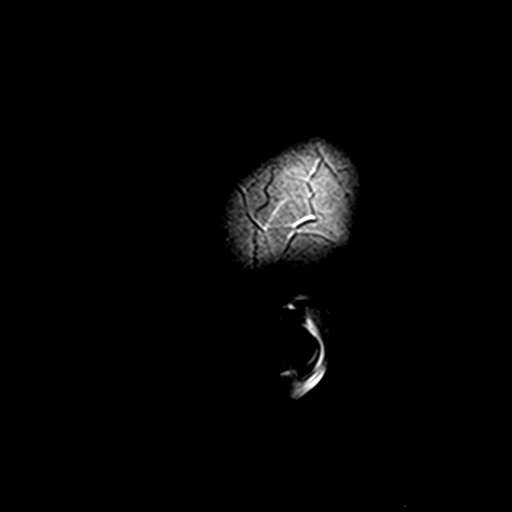
[im 28/28]
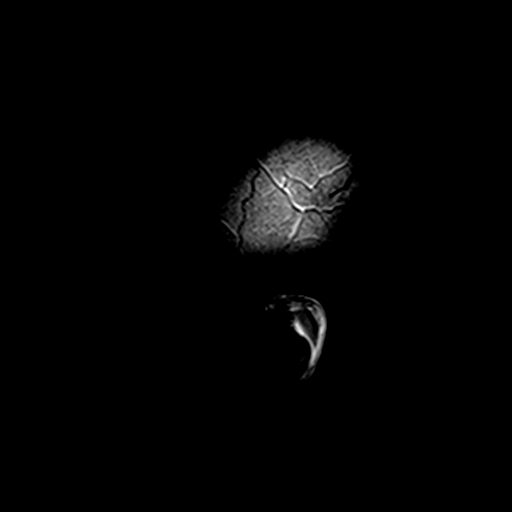

[48 of 48 positions shown; findings below may reference images not displayed]

FINDINGS: Brain: The brain has a normal appearance without evidence of
malformation, atrophy, old or acute small or large vessel
infarction, mass lesion, hemorrhage, hydrocephalus or extra-axial
collection. Cerebellar tonsils extend less than 5 mm through the
foramen magnum, upper limits of normal.

Vascular: Major vessels at the base of the brain show flow. Venous
sinuses appear patent.

Skull and upper cervical spine: Normal.

Sinuses/Orbits: Clear/normal.

Other: None significant.
IMPRESSION: Normal examination.

## 2019-12-05 DIAGNOSIS — F4310 Post-traumatic stress disorder, unspecified: Secondary | ICD-10-CM | POA: Diagnosis not present

## 2019-12-11 ENCOUNTER — Other Ambulatory Visit: Payer: Self-pay | Admitting: Nurse Practitioner

## 2019-12-11 DIAGNOSIS — R0602 Shortness of breath: Secondary | ICD-10-CM

## 2019-12-12 DIAGNOSIS — F4310 Post-traumatic stress disorder, unspecified: Secondary | ICD-10-CM | POA: Diagnosis not present

## 2019-12-19 ENCOUNTER — Ambulatory Visit: Payer: Medicaid Other | Admitting: Psychology

## 2019-12-19 ENCOUNTER — Ambulatory Visit (INDEPENDENT_AMBULATORY_CARE_PROVIDER_SITE_OTHER): Payer: Medicaid Other | Admitting: Psychology

## 2019-12-19 ENCOUNTER — Other Ambulatory Visit: Payer: Self-pay

## 2019-12-19 ENCOUNTER — Encounter: Payer: Self-pay | Admitting: Psychology

## 2019-12-19 DIAGNOSIS — F4310 Post-traumatic stress disorder, unspecified: Secondary | ICD-10-CM | POA: Insufficient documentation

## 2019-12-19 DIAGNOSIS — F900 Attention-deficit hyperactivity disorder, predominantly inattentive type: Secondary | ICD-10-CM

## 2019-12-19 DIAGNOSIS — F339 Major depressive disorder, recurrent, unspecified: Secondary | ICD-10-CM

## 2019-12-19 DIAGNOSIS — S065X9A Traumatic subdural hemorrhage with loss of consciousness of unspecified duration, initial encounter: Secondary | ICD-10-CM

## 2019-12-19 DIAGNOSIS — R413 Other amnesia: Secondary | ICD-10-CM

## 2019-12-19 DIAGNOSIS — S065XAA Traumatic subdural hemorrhage with loss of consciousness status unknown, initial encounter: Secondary | ICD-10-CM

## 2019-12-19 DIAGNOSIS — F1411 Cocaine abuse, in remission: Secondary | ICD-10-CM

## 2019-12-19 DIAGNOSIS — Z91410 Personal history of adult physical and sexual abuse: Secondary | ICD-10-CM

## 2019-12-19 HISTORY — DX: Personal history of adult physical and sexual abuse: Z91.410

## 2019-12-19 NOTE — Progress Notes (Signed)
   Psychometrician Note   Cognitive testing was administered to Jeanelle Malling by technician Wallace Keller, B.S. under the supervision of Dr. Newman Nickels, Ph.D., licensed psychologist. Ms. Despain did not appear overtly distressed by the testing session, per behavioral observation or via self-report to the technician. Rest breaks were offered.    In considering the patient's current level of functioning, level of presumed impairment, nature of symptoms, emotional and behavioral responses during the interview, level of literacy, and observed level of motivation/effort, a battery of tests was selected and communicated to the psychometrician.   Communication between the psychologist and technician was ongoing throughout the testing session and changes were made as deemed necessary based on patient performance on testing, technician observations and additional pertinent factors such as those listed above.   WESLYNN KE will return within approximately two weeks for an interactive feedback session with Dr. Milbert Coulter at which time her test performances, clinical impressions, and treatment recommendations will be reviewed in detail. The patient understands she can contact our office should she require our assistance before this time.  110 minutes were spent face-to-face with Ms. Didion administering standardized tests. An additional 30 minutes were spent scoring by the technician. [CPT [id number], [id number]]  This note reflects time spent with the psychometrician and does not include test scores or any clinical interpretations made by Dr. Milbert Coulter. The full report will follow in a separate note.

## 2019-12-19 NOTE — Progress Notes (Signed)
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 requirements of this provider. The patient was given the opportunity to ask questions and receive answers about the evaluation. Oral consent to participate was provided by the patient.   This evaluation was conducted by Newman NickelsZachary C. Kanchan Gal, Ph.D., licensed clinical neuropsychologist. Ms. Tanya Nonesickard completed a comprehensive clinical interview with Dr. Milbert CoulterMerz, billed as one unit [phone], and 140 minutes of cognitive testing and scoring, billed as one unit [phone] and four additional units [id number]. Psychometrist Wallace Kellerana Chamberlain, B.S., assisted Dr. Milbert CoulterMerz with test administration and scoring procedures. As a separate and discrete service, Dr. Milbert CoulterMerz spent a total of 180 minutes in interpretation and report writing billed as one unit [phone] and two units [id number].

## 2019-12-25 ENCOUNTER — Other Ambulatory Visit: Payer: Self-pay | Admitting: Family Medicine

## 2019-12-25 DIAGNOSIS — S049XXD Injury of unspecified cranial nerve, subsequent encounter: Secondary | ICD-10-CM

## 2019-12-26 ENCOUNTER — Ambulatory Visit (INDEPENDENT_AMBULATORY_CARE_PROVIDER_SITE_OTHER): Payer: Medicaid Other | Admitting: Psychology

## 2019-12-26 ENCOUNTER — Encounter: Payer: Self-pay | Admitting: Psychology

## 2019-12-26 ENCOUNTER — Other Ambulatory Visit: Payer: Self-pay

## 2019-12-26 DIAGNOSIS — F339 Major depressive disorder, recurrent, unspecified: Secondary | ICD-10-CM

## 2019-12-26 DIAGNOSIS — F4310 Post-traumatic stress disorder, unspecified: Secondary | ICD-10-CM

## 2019-12-26 DIAGNOSIS — F900 Attention-deficit hyperactivity disorder, predominantly inattentive type: Secondary | ICD-10-CM
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
## 2020-01-02 ENCOUNTER — Other Ambulatory Visit: Payer: Self-pay

## 2020-01-02 DIAGNOSIS — F4310 Post-traumatic stress disorder, unspecified: Secondary | ICD-10-CM | POA: Diagnosis not present

## 2020-01-02 MED ORDER — NORTRIPTYLINE HCL 10 MG PO CAPS: 10.0000 mg | ORAL_CAPSULE | Freq: Two times a day (BID) | ORAL | 3 refills | Status: DC

## 2020-01-23 ENCOUNTER — Other Ambulatory Visit: Payer: Self-pay | Admitting: Family Medicine

## 2020-01-23 DIAGNOSIS — M7552 Bursitis of left shoulder: Secondary | ICD-10-CM

## 2020-01-23 DIAGNOSIS — F4310 Post-traumatic stress disorder, unspecified: Secondary | ICD-10-CM | POA: Diagnosis not present

## 2020-01-27 DIAGNOSIS — F4312 Post-traumatic stress disorder, chronic: Secondary | ICD-10-CM | POA: Diagnosis not present

## 2020-01-27 DIAGNOSIS — F900 Attention-deficit hyperactivity disorder, predominantly inattentive type: Secondary | ICD-10-CM | POA: Diagnosis not present

## 2020-01-27 DIAGNOSIS — Z79899 Other long term (current) drug therapy: Secondary | ICD-10-CM | POA: Diagnosis not present

## 2020-01-30 ENCOUNTER — Telehealth: Payer: Self-pay | Admitting: Neurology

## 2020-01-30 DIAGNOSIS — F4310 Post-traumatic stress disorder, unspecified: Secondary | ICD-10-CM | POA: Diagnosis not present

## 2020-01-30 NOTE — Telephone Encounter (Signed)
Patient called in regarding her needing to have lab work at her Doctors office near her home in [place]. She said Dr. Everlena Cooper was going to put her orders in. She has had a hard time trying to reach that office to make sure they received the orders and can she go ahead and get the lab work? She said that she is having more headaches. Please Call. Thank you

## 2020-01-30 NOTE — Telephone Encounter (Signed)
Refaxed labs to [phone].

## 2020-01-31 ENCOUNTER — Telehealth: Payer: Self-pay | Admitting: Family Medicine

## 2020-01-31 DIAGNOSIS — G43009 Migraine without aura, not intractable, without status migrainosus: Secondary | ICD-10-CM

## 2020-01-31 DIAGNOSIS — R413 Other amnesia: Secondary | ICD-10-CM

## 2020-01-31 NOTE — Telephone Encounter (Signed)
Received fax with lab orders for this patient.  Orders are from Neurology Dr Everlena Cooper  Orders are already placed in system from Dr Everlena Cooper with Vitamin B12 and TSH - these are [facility] orders.  ------ Please notify patient that:  Patient will need to come to our [facility] to get the blood draw whenever she is ready.  Orders are Future - need to be released.  Saralyn Pilar, DO [facility] 01/31/2020, 12:46 PM

## 2020-01-31 NOTE — Telephone Encounter (Signed)
Our lab can only draw labs from our providers.  Discontinued existing TSH + B12 labs from Dr Everlena Cooper  Placed new orders TSH + B12, future to be drawn here. Patient notified  Saralyn Pilar, DO [facility] 01/31/2020, 4:33 PM

## 2020-02-10 ENCOUNTER — Other Ambulatory Visit: Payer: Self-pay | Admitting: Nurse Practitioner

## 2020-02-10 DIAGNOSIS — A6004 Herpesviral vulvovaginitis: Secondary | ICD-10-CM

## 2020-02-11 ENCOUNTER — Encounter: Payer: Self-pay | Admitting: Family Medicine

## 2020-02-11 ENCOUNTER — Other Ambulatory Visit: Payer: Self-pay

## 2020-02-11 ENCOUNTER — Ambulatory Visit: Payer: Medicaid Other | Admitting: Family Medicine

## 2020-02-11 VITALS — BP 117/70 | HR 103 | Temp 97.5°F | Ht 62.0 in | Wt 127.0 lb

## 2020-02-11 DIAGNOSIS — A6004 Herpesviral vulvovaginitis: Secondary | ICD-10-CM | POA: Diagnosis not present

## 2020-02-11 DIAGNOSIS — R21 Rash and other nonspecific skin eruption: Secondary | ICD-10-CM

## 2020-02-11 DIAGNOSIS — R591 Generalized enlarged lymph nodes: Secondary | ICD-10-CM | POA: Insufficient documentation

## 2020-02-11 MED ORDER — ACYCLOVIR 400 MG PO TABS: 400.0000 mg | ORAL_TABLET | Freq: Three times a day (TID) | ORAL | 0 refills | Status: AC

## 2020-02-11 NOTE — Assessment & Plan Note (Signed)
Non specific rash that occurs sporadically in the evenings across chest and upper thighs.  No specific pattern for rash and does not happen nightly.  Discussed could be related to her recent vaccination but is not constant, not present in clinic today, and self resolves every evening.  Discussed treating with Benadryl, but unable to tolerate 25mg  dosing.  Discussed daily Zyrtec to help alleviate histamine response and itch as well as topical Benadryl cream as needed.  Plan: 1) Can begin to take Zyrtec 10mg  daily to help with non-specific skin eruption 2) Can take pediatric dosing of Benadryl (12.5mg ) by either breaking a 25mg  tablet in half or taking over the counter children's benadryl every 4-6 hours when rash is present to help with itch 3) Can use over the counter Benadryl topical cream to help with itch/rash 4) To follow up with any worsening of symptoms or if symptoms do not resolve with above mentioned treatment.

## 2020-02-11 NOTE — Patient Instructions (Signed)
As we discussed, the lymphadenopathy in your left axilla is likely related to your second COVID vaccine.  We have seen lymphadenopathy up to 6 weeks after vaccination.  If you have continued symptoms after 6 weeks from vaccination (another 4 weeks from now), we can send you for an ultrasound for further imaging.  The on/off nightly rash can certainly be related to recent vaccination.  Can begin over the counter Zyrtec 10mg  daily to assist and Benadryl 12.5mg  (pediatric dosing) to help with the itch and inflammation.  We will plan to see you back as needed for this  You will receive a survey after today's visit either digitally by e-mail or paper by USPS mail. Your experiences and feedback matter to .  Please respond so we know how we are doing as we provide care for you.  Call us with any questions/concerns/needs.  It is my goal to be available to you for your health concerns.  Thanks for choosing me to be a partner in your healthcare needs!  [place], FNP-C Family Nurse Practitioner [facility] Phone: [phone]

## 2020-02-11 NOTE — Assessment & Plan Note (Signed)
Left sided lymphadenopathy in axilla x 1.  Mobile, tender, size of golfball.  Likely related to recent COVID vaccine (2nd dose) in left arm 2 weeks ago, given current studies/research showing lymphadenopathy in patients having COVID vaccine within 6 weeks.  Discussed with patient and will continue to watch/monitor at home, if still having swelling/discomfort at 6 weeks from vaccination (last vaccine 01/24/2020) will send for ultrasound.  Plan: 1) Continue to monitor through 6 weeks from vaccination 2) If continues to be enlarged/tender will send for ultrasound 3) Return to clinic as needed
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
## 2020-02-11 NOTE — Assessment & Plan Note (Signed)
Takes Acyclovir when feels is having a flare.  Has not utilized medication for over a year and requesting refill to have medication on hand should she need it.

## 2020-02-13 DIAGNOSIS — F4310 Post-traumatic stress disorder, unspecified: Secondary | ICD-10-CM | POA: Diagnosis not present

## 2020-02-17 DIAGNOSIS — F4312 Post-traumatic stress disorder, chronic: Secondary | ICD-10-CM | POA: Diagnosis not present

## 2020-02-17 DIAGNOSIS — F900 Attention-deficit hyperactivity disorder, predominantly inattentive type: Secondary | ICD-10-CM | POA: Diagnosis not present

## 2020-02-19 ENCOUNTER — Other Ambulatory Visit: Payer: Self-pay

## 2020-02-19 DIAGNOSIS — R413 Other amnesia: Secondary | ICD-10-CM

## 2020-02-19 DIAGNOSIS — G43009 Migraine without aura, not intractable, without status migrainosus: Secondary | ICD-10-CM

## 2020-02-20 LAB — TSH: TSH: 0.62 mIU/L

## 2020-02-20 LAB — VITAMIN B12: Vitamin B-12: 478 pg/mL (ref 200–1100)

## 2020-02-27 DIAGNOSIS — F4310 Post-traumatic stress disorder, unspecified: Secondary | ICD-10-CM | POA: Diagnosis not present

## 2020-03-02 ENCOUNTER — Telehealth: Payer: Self-pay | Admitting: Family Medicine

## 2020-03-02 ENCOUNTER — Other Ambulatory Visit: Payer: Self-pay | Admitting: Family Medicine

## 2020-03-02 DIAGNOSIS — S049XXD Injury of unspecified cranial nerve, subsequent encounter: Secondary | ICD-10-CM

## 2020-03-02 NOTE — Telephone Encounter (Signed)
Pt called requesting refill on gabapentin  °

## 2020-03-05 DIAGNOSIS — F4310 Post-traumatic stress disorder, unspecified: Secondary | ICD-10-CM | POA: Diagnosis not present

## 2020-03-12 DIAGNOSIS — F4310 Post-traumatic stress disorder, unspecified: Secondary | ICD-10-CM | POA: Diagnosis not present

## 2020-03-13 ENCOUNTER — Encounter: Payer: Self-pay | Admitting: Family Medicine
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
## 2020-03-16 ENCOUNTER — Ambulatory Visit (INDEPENDENT_AMBULATORY_CARE_PROVIDER_SITE_OTHER): Payer: Medicaid Other | Admitting: Family Medicine

## 2020-03-16 ENCOUNTER — Ambulatory Visit: Payer: Medicaid Other | Admitting: Neurology

## 2020-03-16 ENCOUNTER — Encounter: Payer: Self-pay | Admitting: Family Medicine

## 2020-03-16 ENCOUNTER — Other Ambulatory Visit: Payer: Self-pay

## 2020-03-16 VITALS — BP 101/70 | HR 99 | Temp 97.1°F | Resp 16 | Ht 62.0 in | Wt 122.6 lb

## 2020-03-16 DIAGNOSIS — K589 Irritable bowel syndrome without diarrhea: Secondary | ICD-10-CM | POA: Insufficient documentation

## 2020-03-16 DIAGNOSIS — K582 Mixed irritable bowel syndrome: Secondary | ICD-10-CM | POA: Diagnosis not present

## 2020-03-16 DIAGNOSIS — R21 Rash and other nonspecific skin eruption: Secondary | ICD-10-CM

## 2020-03-16 DIAGNOSIS — R1084 Generalized abdominal pain: Secondary | ICD-10-CM | POA: Diagnosis not present

## 2020-03-16 DIAGNOSIS — R109 Unspecified abdominal pain: Secondary | ICD-10-CM | POA: Insufficient documentation

## 2020-03-16 NOTE — Assessment & Plan Note (Signed)
Self reported IBS, has not met with gastroenterology in the past.  Days of alternating constipation/diarrhea.  Reports this has been lifelong.  Has tried to cut out gluten and dairy without a change in her symptoms.  Plan: 1. Referral to gastroenterology placed.

## 2020-03-16 NOTE — Assessment & Plan Note (Addendum)
Abdominal pain x 6 months, pain generally RUQ after eating.  Has pain that is described as burning/gnawing.  States pain comes/goes and sometimes does not have a trigger that makes it worse.  Denies any vomiting or changes in bowel movements (reports has IBS).  Discussed could begin over the counter PPI, such as omeprazole, daily, and could take up to 4 days to see a change in symptoms.  Referral to GI placed.  See IBS A/P.  Plan: 1. Labs ordered today 2. H. Pylori breath test ordered today 3. See IBS A/P

## 2020-03-16 NOTE — Assessment & Plan Note (Signed)
Reports no change in symptoms with taking Zyrtec 10mg  daily, using Benadryl 12.5mg  intermittently, and topical hydrocortisone cream along with topical benadryl cream.  Reports rash is no longer intermittent and self resolving, now is present at all times.  Has not found anything that has made her symptoms better.  Reports to bilateral upper arms now, previously noted across chest and upper thighs.  Plan: 1. Referral to dermatology.
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
## 2020-03-16 NOTE — Patient Instructions (Signed)
As we discussed, I have put in a referral to dermatology for your continued rash that is unresponsive to Zyrtec, Benadryl, Hydrocortisone cream and benadryl topical cream and for gastroenterology for your lifelong history of IBS-C/D.  If you have not heard anything from them in 1 week to contact our office and we will follow up on this.  I have put in lab orders to be done today and will contact you once we receive the results.  We will plan to see you back for this as needed.  You will receive a survey after today's visit either digitally by e-mail or paper by [employer]. Your experiences and feedback matter to [place].  Please respond so we know how we are doing as we provide care for you.  Call us with any questions/concerns/needs.  It is my goal to be available to you for your health concerns.  Thanks for choosing me to be a partner in your healthcare needs!  Charlaine Dalton, FNP-C Family Nurse Practitioner [facility] Phone: [phone]

## 2020-03-17 LAB — CBC WITH DIFFERENTIAL/PLATELET
Absolute Monocytes: 371 cells/uL (ref 200–950)
Basophils Absolute: 21 cells/uL (ref 0–200)
Basophils Relative: 0.3 %
Eosinophils Absolute: 91 cells/uL (ref 15–500)
Eosinophils Relative: 1.3 %
HCT: 35.9 % (ref 35.0–45.0)
Hemoglobin: 12.2 g/dL (ref 11.7–15.5)
Lymphs Abs: 1750 cells/uL (ref 850–3900)
MCH: 31.9 pg (ref 27.0–33.0)
MCHC: 34 g/dL (ref 32.0–36.0)
MCV: 93.7 fL (ref 80.0–100.0)
MPV: 10.7 fL (ref 7.5–12.5)
Monocytes Relative: 5.3 %
Neutro Abs: 4767 cells/uL (ref 1500–7800)
Neutrophils Relative %: 68.1 %
Platelets: 283 10*3/uL (ref 140–400)
RBC: 3.83 10*6/uL (ref 3.80–5.10)
RDW: 12.4 % (ref 11.0–15.0)
Total Lymphocyte: 25 %
WBC: 7 10*3/uL (ref 3.8–10.8)

## 2020-03-17 LAB — COMPLETE METABOLIC PANEL WITH GFR
AG Ratio: 1.5 (calc) (ref 1.0–2.5)
ALT: 15 U/L (ref 6–29)
AST: 13 U/L (ref 10–30)
Albumin: 4 g/dL (ref 3.6–5.1)
Alkaline phosphatase (APISO): 57 U/L (ref 31–125)
BUN: 18 mg/dL (ref 7–25)
CO2: 26 mmol/L (ref 20–32)
Calcium: 9.6 mg/dL (ref 8.6–10.2)
Chloride: 105 mmol/L (ref 98–110)
Creat: 0.69 mg/dL (ref 0.50–1.10)
GFR, Est African American: 134 mL/min/{1.73_m2} (ref >=60)
GFR, Est Non African American: 115 mL/min/{1.73_m2} (ref >=60)
Globulin: 2.6 g/dL (calc) (ref 1.9–3.7)
Glucose, Bld: 112 mg/dL (ref 65–139)
Potassium: 4.1 mmol/L (ref 3.5–5.3)
Sodium: 139 mmol/L (ref 135–146)
Total Bilirubin: 0.5 mg/dL (ref 0.2–1.2)
Total Protein: 6.6 g/dL (ref 6.1–8.1)

## 2020-03-17 LAB — H. PYLORI BREATH TEST: H. pylori Breath Test: NOT DETECTED

## 2020-03-18 ENCOUNTER — Encounter: Payer: Self-pay | Admitting: Family Medicine

## 2020-03-18 ENCOUNTER — Ambulatory Visit: Payer: Medicaid Other | Admitting: Dermatology

## 2020-03-18 ENCOUNTER — Other Ambulatory Visit: Payer: Self-pay

## 2020-03-18 DIAGNOSIS — R21 Rash and other nonspecific skin eruption: Secondary | ICD-10-CM | POA: Diagnosis not present

## 2020-03-18 MED ORDER — TRIAMCINOLONE ACETONIDE 0.1 % EX CREA: 1.0000 "application " | TOPICAL_CREAM | Freq: Two times a day (BID) | CUTANEOUS | 1 refills | Status: DC | PRN

## 2020-03-18 NOTE — Progress Notes (Signed)
   New Patient Visit  Subjective  [name] is a 34 y.o. female who presents for the following: Rash (of arms. Came up after she got her second COVID vaccine on March 5th. Rash showed up within a couple days and it is itchy.).    The following portions of the chart were reviewed this encounter and updated as appropriate:  Tobacco  Allergies  Meds  Problems  Med Hx  Surg Hx  Fam Hx      Review of Systems:  No other skin or systemic complaints except as noted in HPI or Assessment and Plan.  Objective  Well appearing patient in no apparent distress; mood and affect are within normal limits.  A focused examination was performed including arms. Relevant physical exam findings are noted in the Assessment and Plan.  Objective  Arms: Pink rough patchy scaliness of ant lat biceps.   Assessment & Plan  Rash Arms  Hypersensitivity reaction secondary to COVID vaccine vs eczema  triamcinolone cream (KENALOG) 0.1 % - Arms  Return if symptoms worsen or fail to improve.   I, Joanie Coddington, CMA, am acting as scribe for Armida Sans, MD .      Documentation: I have reviewed the above documentation for accuracy and completeness, and I agree with the above.  Armida Sans, MD

## 2020-03-19 DIAGNOSIS — F4310 Post-traumatic stress disorder, unspecified: Secondary | ICD-10-CM | POA: Diagnosis not present

## 2020-03-21 ENCOUNTER — Encounter: Payer: Self-pay | Admitting: Dermatology

## 2020-03-24 DIAGNOSIS — F900 Attention-deficit hyperactivity disorder, predominantly inattentive type: Secondary | ICD-10-CM | POA: Diagnosis not present

## 2020-03-24 DIAGNOSIS — F4312 Post-traumatic stress disorder, chronic: Secondary | ICD-10-CM | POA: Diagnosis not present

## 2020-03-25 ENCOUNTER — Other Ambulatory Visit: Payer: Self-pay | Admitting: Family Medicine

## 2020-03-25 DIAGNOSIS — M7552 Bursitis of left shoulder: Secondary | ICD-10-CM

## 2020-03-29 ENCOUNTER — Other Ambulatory Visit: Payer: Self-pay | Admitting: Family Medicine

## 2020-03-29 DIAGNOSIS — J4520 Mild intermittent asthma, uncomplicated: Secondary | ICD-10-CM

## 2020-03-29 NOTE — Telephone Encounter (Signed)
Requested Prescriptions  Pending Prescriptions Disp Refills  . DULERA 200-5 MCG/ACT AERO [Pharmacy Med Name: DULERA 200 MCG-5 MCG INHALER] 13 g 1    Sig: TAKE 2 PUFFS BY MOUTH TWICE A DAY     Pulmonology:  Combination Products Passed - 03/29/2020  9:25 AM      Passed - Valid encounter within last 12 months    Recent Outpatient Visits          1 week ago Rash and nonspecific skin eruption   [facility], Jodelle Gross, FNP   1 month ago Herpes simplex vulvovaginitis   [facility], Jodelle Gross, FNP   5 months ago Strep throat   [facility] Smitty Cords, DO   6 months ago Bursitis of left shoulder   [facility] Smitty Cords, DO   8 months ago Injury to cranial nerve, unspecified cranial nerve, subsequent encounter   [facility] Kyung Rudd, Alison Stalling, NP

## 2020-03-30 ENCOUNTER — Other Ambulatory Visit: Payer: Self-pay

## 2020-03-30 ENCOUNTER — Ambulatory Visit (INDEPENDENT_AMBULATORY_CARE_PROVIDER_SITE_OTHER): Payer: Medicaid Other | Admitting: Certified Nurse Midwife

## 2020-03-30 ENCOUNTER — Other Ambulatory Visit (HOSPITAL_COMMUNITY)
Admission: RE | Admit: 2020-03-30 | Discharge: 2020-03-30 | Disposition: A | Discharge status: Home / Self Care | Payer: Medicaid Other | Source: Ambulatory Visit | Attending: Certified Nurse Midwife | Admitting: Certified Nurse Midwife

## 2020-03-30 ENCOUNTER — Encounter: Payer: Self-pay | Admitting: Certified Nurse Midwife

## 2020-03-30 VITALS — BP 109/75 | HR 107 | Ht 62.0 in | Wt 119.2 lb

## 2020-03-30 DIAGNOSIS — N898 Other specified noninflammatory disorders of vagina: Secondary | ICD-10-CM | POA: Diagnosis not present

## 2020-03-30 DIAGNOSIS — N926 Irregular menstruation, unspecified: Secondary | ICD-10-CM | POA: Diagnosis not present

## 2020-03-30 DIAGNOSIS — R232 Flushing: Secondary | ICD-10-CM | POA: Diagnosis not present

## 2020-03-30 NOTE — Patient Instructions (Signed)
Abnormal Uterine Bleeding °Abnormal uterine bleeding means bleeding more than usual from your uterus. It can include: °· Bleeding between periods. °· Bleeding after sex. °· Bleeding that is heavier than normal. °· Periods that last longer than usual. °· Bleeding after you have stopped having your period (menopause). °There are many problems that may cause this. You should see a doctor for any kind of bleeding that is not normal. Treatment depends on the cause of the bleeding. °Follow these instructions at home: °· Watch your condition for any changes. °· Do not use tampons, douche, or have sex, if your doctor tells you not to. °· Change your pads often. °· Get regular well-woman exams. Make sure they include a pelvic exam and cervical cancer screening. °· Keep all follow-up visits as told by your doctor. This is important. °Contact a doctor if: °· The bleeding lasts more than one week. °· You feel dizzy at times. °· You feel like you are going to throw up (nauseous). °· You throw up. °Get help right away if: °· You pass out. °· You have to change pads every hour. °· You have belly (abdominal) pain. °· You have a fever. °· You get sweaty. °· You get weak. °· You passing large blood clots from your vagina. °Summary °· Abnormal uterine bleeding means bleeding more than usual from your uterus. °· There are many problems that may cause this. You should see a doctor for any kind of bleeding that is not normal. °· Treatment depends on the cause of the bleeding. °This information is not intended to replace advice given to you by your health care provider. Make sure you discuss any questions you have with your health care provider. °Document Revised: 11/01/2016 Document Reviewed: 11/01/2016 °Elsevier Patient Education © 2020 Elsevier Inc. ° °
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
## 2020-03-31 ENCOUNTER — Other Ambulatory Visit: Payer: Self-pay

## 2020-04-01 ENCOUNTER — Encounter: Payer: Self-pay | Admitting: Gastroenterology

## 2020-04-01 ENCOUNTER — Other Ambulatory Visit: Payer: Self-pay

## 2020-04-01 ENCOUNTER — Ambulatory Visit (INDEPENDENT_AMBULATORY_CARE_PROVIDER_SITE_OTHER): Payer: Medicaid Other | Admitting: Gastroenterology

## 2020-04-01 VITALS — BP 115/76 | HR 99 | Temp 98.3°F | Ht 63.0 in | Wt 120.0 lb

## 2020-04-01 DIAGNOSIS — S92425A Nondisplaced fracture of distal phalanx of left great toe, initial encounter for closed fracture: Secondary | ICD-10-CM | POA: Diagnosis not present

## 2020-04-01 DIAGNOSIS — R109 Unspecified abdominal pain: Secondary | ICD-10-CM

## 2020-04-01 MED ORDER — OMEPRAZOLE 40 MG PO CPDR
40.0000 mg | DELAYED_RELEASE_CAPSULE | Freq: Every day | ORAL | 0 refills | Status: DC
Start: 2020-04-01 — End: 2020-06-02

## 2020-04-01 NOTE — Patient Instructions (Signed)
Please go to your pharmacy and get Metamucil, this is an over-the-counter medication. Please use daily.

## 2020-04-02 ENCOUNTER — Other Ambulatory Visit (INDEPENDENT_AMBULATORY_CARE_PROVIDER_SITE_OTHER): Payer: Medicaid Other | Admitting: Certified Nurse Midwife

## 2020-04-02 DIAGNOSIS — F4310 Post-traumatic stress disorder, unspecified: Secondary | ICD-10-CM | POA: Diagnosis not present

## 2020-04-02 DIAGNOSIS — A749 Chlamydial infection, unspecified: Secondary | ICD-10-CM

## 2020-04-02 LAB — CERVICOVAGINAL ANCILLARY ONLY
Bacterial Vaginitis (gardnerella): NEGATIVE
Candida Glabrata: NEGATIVE
Candida Vaginitis: NEGATIVE
Chlamydia: POSITIVE — AB
Comment: NEGATIVE
Comment: NEGATIVE
Comment: NEGATIVE
Comment: NEGATIVE
Comment: NEGATIVE
Comment: NORMAL
Neisseria Gonorrhea: NEGATIVE
Trichomonas: NEGATIVE

## 2020-04-02 MED ORDER — AZITHROMYCIN 500 MG PO TABS
1000.0000 mg | ORAL_TABLET | Freq: Once | ORAL | 1 refills | Status: AC
Start: 2020-04-02 — End: 2020-04-02
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
## 2020-04-02 NOTE — Telephone Encounter (Signed)
Done. Notify [facility]. Thanks, JML

## 2020-04-02 NOTE — Progress Notes (Signed)
Rx Azithromycin, see orders.    Gunnar Bulla, CNM [facility], [facility] 04/02/20 5:15 PM

## 2020-04-03 ENCOUNTER — Other Ambulatory Visit: Payer: Self-pay | Admitting: Certified Nurse Midwife

## 2020-04-03 MED ORDER — AZITHROMYCIN 500 MG PO TABS: 1000.0000 mg | ORAL_TABLET | Freq: Once | ORAL | 0 refills | Status: AC

## 2020-04-03 NOTE — Progress Notes (Signed)
Vaginal swab positive for Chlamydia. Orders placed for treatment   Doreene Burke, CNM

## 2020-04-06 DIAGNOSIS — F900 Attention-deficit hyperactivity disorder, predominantly inattentive type: Secondary | ICD-10-CM | POA: Diagnosis not present

## 2020-04-06 DIAGNOSIS — F4312 Post-traumatic stress disorder, chronic: Secondary | ICD-10-CM | POA: Diagnosis not present

## 2020-04-07 LAB — THYROID PANEL WITH TSH
Free Thyroxine Index: 2.2 (ref 1.2–4.9)
T3 Uptake Ratio: 23 % — ABNORMAL LOW (ref 24–39)
T4, Total: 9.5 ug/dL (ref 4.5–12.0)
TSH: 0.43 u[IU]/mL — ABNORMAL LOW (ref 0.450–4.500)

## 2020-04-07 LAB — ESTRADIOL, FREE
Estradiol, Serum, MS: 5.2 pg/mL
Free Estradiol, Percent: 1.8 %
Free Estradiol, Serum: 0.09 pg/mL — ABNORMAL LOW

## 2020-04-07 LAB — FOLLICLE STIMULATING HORMONE: FSH: 4.7 m[IU]/mL

## 2020-04-08 ENCOUNTER — Telehealth: Payer: Self-pay | Admitting: Certified Nurse Midwife

## 2020-04-08 NOTE — Telephone Encounter (Signed)
Patient called in saying she received her tests results however she has questions regarding her results. Could you please advise?  Informed patient nurses have 24-48 hours to get back in touch with patients. Patient verbalized understanding.

## 2020-04-13 ENCOUNTER — Other Ambulatory Visit: Payer: Medicaid Other

## 2020-04-15 ENCOUNTER — Other Ambulatory Visit: Payer: Self-pay

## 2020-04-15 ENCOUNTER — Ambulatory Visit (INDEPENDENT_AMBULATORY_CARE_PROVIDER_SITE_OTHER): Payer: Medicaid Other

## 2020-04-15 DIAGNOSIS — N926 Irregular menstruation, unspecified: Secondary | ICD-10-CM

## 2020-04-15 DIAGNOSIS — R232 Flushing: Secondary | ICD-10-CM | POA: Diagnosis not present

## 2020-04-16 DIAGNOSIS — F4310 Post-traumatic stress disorder, unspecified: Secondary | ICD-10-CM | POA: Diagnosis not present

## 2020-04-21 ENCOUNTER — Encounter: Payer: Medicaid Other | Admitting: Psychology

## 2020-04-21 ENCOUNTER — Other Ambulatory Visit: Payer: Self-pay | Admitting: Certified Nurse Midwife

## 2020-04-21 DIAGNOSIS — K259 Gastric ulcer, unspecified as acute or chronic, without hemorrhage or perforation: Secondary | ICD-10-CM

## 2020-04-21 HISTORY — DX: Gastric ulcer, unspecified as acute or chronic, without hemorrhage or perforation: K25.9

## 2020-04-21 MED ORDER — ORTHO-NOVUM 1/35 (28) 1-35 MG-MCG PO TABS: 1.0000 | ORAL_TABLET | Freq: Every day | ORAL | 11 refills | Status: DC

## 2020-04-21 NOTE — Progress Notes (Signed)
Orders placed for change in pill due to side effects of current pill.   Doreene Burke, CNM

## 2020-04-28 ENCOUNTER — Encounter: Payer: Medicaid Other | Admitting: Psychology

## 2020-05-07 DIAGNOSIS — F4310 Post-traumatic stress disorder, unspecified: Secondary | ICD-10-CM | POA: Diagnosis not present

## 2020-05-11 DIAGNOSIS — F4312 Post-traumatic stress disorder, chronic: Secondary | ICD-10-CM | POA: Diagnosis not present

## 2020-05-11 DIAGNOSIS — F900 Attention-deficit hyperactivity disorder, predominantly inattentive type: Secondary | ICD-10-CM | POA: Diagnosis not present

## 2020-05-12 ENCOUNTER — Other Ambulatory Visit: Payer: Self-pay

## 2020-05-13 ENCOUNTER — Ambulatory Visit: Payer: Medicaid Other | Admitting: Gastroenterology

## 2020-05-13 ENCOUNTER — Telehealth: Payer: Self-pay | Admitting: Certified Nurse Midwife

## 2020-05-13 ENCOUNTER — Encounter: Payer: Medicaid Other | Admitting: Certified Nurse Midwife

## 2020-05-13 NOTE — Telephone Encounter (Signed)
Pt called in and stated she has an appt today and she wants to know if it is okay to still come. I called back to AT and she said she doesn't know what this pt is coming in for. If it is for her [facility] that we can do a televisit. I got back on the phone with the pt and asked her what she was coming in for the pt stated that it was for a retest stds. The pt stated that she had a message from [facility] stating if she wanted to come in and get retested she can make an appt. The pt stated that she was going to cancel and she might call back in 2 weeks once she gets back from vacation. Please advise

## 2020-06-02 ENCOUNTER — Other Ambulatory Visit: Payer: Self-pay | Admitting: Family Medicine

## 2020-06-02 ENCOUNTER — Telehealth: Payer: Self-pay | Admitting: Gastroenterology

## 2020-06-02 ENCOUNTER — Telehealth: Payer: Self-pay

## 2020-06-02 DIAGNOSIS — J4520 Mild intermittent asthma, uncomplicated: Secondary | ICD-10-CM

## 2020-06-02 MED ORDER — OMEPRAZOLE 40 MG PO CPDR: 40.0000 mg | DELAYED_RELEASE_CAPSULE | Freq: Every day | ORAL | 1 refills | Status: DC

## 2020-06-02 NOTE — Telephone Encounter (Signed)
Is it okay to refill her medication-Omeprazole?

## 2020-06-02 NOTE — Telephone Encounter (Signed)
Called patient to let her know that her prescription was sent to her [facility] pharmacy in [place]. Patient had no questions.

## 2020-06-02 NOTE — Telephone Encounter (Signed)
Patient states the medication omeprazole seemed to work and now that she is out, she is having the GI symptoms again. Pt requesting a refill until her appt on 9.22.21.

## 2020-06-09 DIAGNOSIS — F900 Attention-deficit hyperactivity disorder, predominantly inattentive type: Secondary | ICD-10-CM | POA: Diagnosis not present

## 2020-06-09 DIAGNOSIS — F4312 Post-traumatic stress disorder, chronic: Secondary | ICD-10-CM | POA: Diagnosis not present
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
## 2020-06-22 ENCOUNTER — Ambulatory Visit (INDEPENDENT_AMBULATORY_CARE_PROVIDER_SITE_OTHER): Payer: Medicaid Other | Admitting: Neurology

## 2020-06-22 ENCOUNTER — Encounter: Payer: Self-pay | Admitting: Neurology

## 2020-06-22 ENCOUNTER — Other Ambulatory Visit: Payer: Self-pay

## 2020-06-22 VITALS — BP 104/72 | HR 97 | Ht 63.0 in | Wt 116.0 lb

## 2020-06-22 DIAGNOSIS — G43709 Chronic migraine without aura, not intractable, without status migrainosus: Secondary | ICD-10-CM

## 2020-06-22 DIAGNOSIS — H9313 Tinnitus, bilateral: Secondary | ICD-10-CM

## 2020-06-22 NOTE — Patient Instructions (Signed)
  1. Start Aimovig 70mg  injection every 28 days 2. Take Ubrelvy 100mg  at earliest onset of headache.  May repeat dose once in 2 hours if needed.  Maximum 2 tablets in 24 hours.  If effective, contact me for prescription 3. Limit use of pain relievers to no more than 2 days out of the week.  These medications include acetaminophen, NSAIDs (ibuprofen/Advil/Motrin, naproxen/Aleve, triptans (Imitrex/sumatriptan), Excedrin, and narcotics.  This will help reduce risk of rebound headaches. 4. Be aware of common food triggers:  - Caffeine:  coffee, black tea, cola, Mt. Dew  - Chocolate  - Dairy:  aged cheeses (brie, blue, cheddar, gouda, [place], provolone, [place]ring[place], Swiss, etc), chocolate milk, buttermilk, sour cream, limit eggs and yogurt  - Nuts, peanut butter  - Alcohol  - Cereals/grains:  FRESH breads (fresh bagels, sourdough, doughnuts), yeast productions  - Processed/canned/aged/cured meats (pre-packaged deli meats, hotdogs)  - MSG/glutamate:  soy sauce, flavor enhancer, pickled/preserved/marinated foods  - Sweeteners:  aspartame (Equal, Nutrasweet).  Sugar and Splenda are okay  - Vegetables:  legumes (lima beans, lentils, snow peas, fava beans, pinto peans, peas, garbanzo beans), sauerkraut, onions, olives, pickles  - Fruit:  avocados, bananas, citrus fruit (orange, lemon, grapefruit), mango  - Other:  Frozen meals, macaroni and cheese 5. Routine exercise 6. Stay adequately hydrated (aim for 64 oz water daily) 7. Keep headache diary 8. Maintain proper stress management 9. Maintain proper sleep hygiene 10. Do not skip meals 11. Consider supplements:  magnesium citrate 400mg  daily, riboflavin 400mg  daily, coenzyme Q10 100mg  three times daily. 12. Follow up in 6 months

## 2020-06-23 ENCOUNTER — Encounter: Payer: Self-pay | Admitting: Certified Nurse Midwife

## 2020-06-23 ENCOUNTER — Ambulatory Visit (INDEPENDENT_AMBULATORY_CARE_PROVIDER_SITE_OTHER): Payer: Medicaid Other | Admitting: Certified Nurse Midwife

## 2020-06-23 VITALS — BP 110/74 | HR 92 | Wt 116.0 lb

## 2020-06-23 DIAGNOSIS — N898 Other specified noninflammatory disorders of vagina: Secondary | ICD-10-CM

## 2020-06-23 DIAGNOSIS — Z113 Encounter for screening for infections with a predominantly sexual mode of transmission: Secondary | ICD-10-CM | POA: Diagnosis not present
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
## 2020-06-23 NOTE — Patient Instructions (Signed)
Vaginitis Vaginitis is a condition in which the vaginal tissue swells and becomes red (inflamed). This condition is most often caused by a change in the normal balance of bacteria and yeast that live in the vagina. This change causes an overgrowth of certain bacteria or yeast, which causes the inflammation. There are different types of vaginitis, but the most common types are:  Bacterial vaginosis.  Yeast infection (candidiasis).  Trichomoniasis vaginitis. This is a sexually transmitted disease (STD).  Viral vaginitis.  Atrophic vaginitis.  Allergic vaginitis. What are the causes? The cause of this condition depends on the type of vaginitis. It can be caused by:  Bacteria (bacterial vaginosis).  Yeast, which is a fungus (yeast infection).  A parasite (trichomoniasis vaginitis).  A virus (viral vaginitis).  Low hormone levels (atrophic vaginitis). Low hormone levels can occur during pregnancy, breastfeeding, or after menopause.  Irritants, such as bubble baths, scented tampons, and feminine sprays (allergic vaginitis). Other factors can change the normal balance of the yeast and bacteria that live in the vagina. These include:  Antibiotic medicines.  Poor hygiene.  Diaphragms, vaginal sponges, spermicides, birth control pills, and intrauterine devices (IUD).  Sex.  Infection.  Uncontrolled diabetes.  A weakened defense (immune) system. What increases the risk? This condition is more likely to develop in women who:  Smoke.  Use vaginal douches, scented tampons, or scented sanitary pads.  Wear tight-fitting pants.  Wear thong underwear.  Use oral birth control pills or an IUD.  Have sex without a condom.  Have multiple sex partners.  Have an STD.  Frequently use the spermicide nonoxynol-9.  Eat lots of foods high in sugar.  Have uncontrolled diabetes.  Have low estrogen levels.  Have a weakened immune system from an immune disorder or medical  treatment.  Are pregnant or breastfeeding. What are the signs or symptoms? Symptoms vary depending on the cause of the vaginitis. Common symptoms include:  Abnormal vaginal discharge. ? The discharge is white, gray, or yellow with bacterial vaginosis. ? The discharge is thick, white, and cheesy with a yeast infection. ? The discharge is frothy and yellow or greenish with trichomoniasis.  A bad vaginal smell. The smell is fishy with bacterial vaginosis.  Vaginal itching, pain, or swelling.  Sex that is painful.  Pain or burning when urinating. Sometimes there are no symptoms. How is this diagnosed? This condition is diagnosed based on your symptoms and medical history. A physical exam, including a pelvic exam, will also be done. You may also have other tests, including:  Tests to determine the pH level (acidity or alkalinity) of your vagina.  A whiff test, to assess the odor that results when a sample of your vaginal discharge is mixed with a potassium hydroxide solution.  Tests of vaginal fluid. A sample will be examined under a microscope. How is this treated? Treatment varies depending on the type of vaginitis you have. Your treatment may include:  Antibiotic creams or pills to treat bacterial vaginosis and trichomoniasis.  Antifungal medicines, such as vaginal creams or suppositories, to treat a yeast infection.  Medicine to ease discomfort if you have viral vaginitis. Your sexual partner should also be treated.  Estrogen delivered in a cream, pill, suppository, or vaginal ring to treat atrophic vaginitis. If vaginal dryness occurs, lubricants and moisturizing creams may help. You may need to avoid scented soaps, sprays, or douches.  Stopping use of a product that is causing allergic vaginitis. Then using a vaginal cream to treat the symptoms. Follow   these instructions at home: Lifestyle  Keep your genital area clean and dry. Avoid soap, and only rinse the area with  water.  Do not douche or use tampons until your health care provider says it is okay to do so. Use sanitary pads, if needed.  Do not have sex until your health care provider approves. When you can return to sex, practice safe sex and use condoms.  Wipe from front to back. This avoids the spread of bacteria from the rectum to the vagina. General instructions  Take over-the-counter and prescription medicines only as told by your health care provider.  If you were prescribed an antibiotic medicine, take or use it as told by your health care provider. Do not stop taking or using the antibiotic even if you start to feel better.  Keep all follow-up visits as told by your health care provider. This is important. How is this prevented?  Use mild, non-scented products. Do not use things that can irritate the vagina, such as fabric softeners. Avoid the following products if they are scented: ? Feminine sprays. ? Detergents. ? Tampons. ? Feminine hygiene products. ? Soaps or bubble baths.  Let air reach your genital area. ? Wear cotton underwear to reduce moisture buildup. ? Avoid wearing underwear while you sleep. ? Avoid wearing tight pants and underwear or nylons without a cotton panel. ? Avoid wearing thong underwear.  Take off any wet clothing, such as bathing suits, as soon as possible.  Practice safe sex and use condoms. Contact a health care provider if:  You have abdominal pain.  You have a fever.  You have symptoms that last for more than 2-3 days. Get help right away if:  You have a fever and your symptoms suddenly get worse. Summary  Vaginitis is a condition in which the vaginal tissue becomes inflamed.This condition is most often caused by a change in the normal balance of bacteria and yeast that live in the vagina.  Treatment varies depending on the type of vaginitis you have.  Do not douche, use tampons , or have sex until your health care provider approves. When  you can return to sex, practice safe sex and use condoms. This information is not intended to replace advice given to you by your health care provider. Make sure you discuss any questions you have with your health care provider. Document Revised: 10/20/2017 Document Reviewed: 12/13/2016 Elsevier Patient Education  2020 Elsevier Inc.  

## 2020-06-24 ENCOUNTER — Other Ambulatory Visit (HOSPITAL_COMMUNITY)
Admission: RE | Admit: 2020-06-24 | Discharge: 2020-06-24 | Disposition: A | Discharge status: Home / Self Care | Payer: Medicaid Other | Source: Ambulatory Visit | Attending: Certified Nurse Midwife | Admitting: Certified Nurse Midwife

## 2020-06-24 DIAGNOSIS — Z113 Encounter for screening for infections with a predominantly sexual mode of transmission: Secondary | ICD-10-CM | POA: Insufficient documentation

## 2020-06-24 NOTE — Addendum Note (Signed)
Addended by: Mechele Claude on: 06/24/2020 11:27 AM   Modules accepted: Orders

## 2020-06-26 LAB — CERVICOVAGINAL ANCILLARY ONLY
Bacterial Vaginitis (gardnerella): POSITIVE — AB
Candida Glabrata: NEGATIVE
Candida Vaginitis: NEGATIVE
Chlamydia: NEGATIVE
Comment: NEGATIVE
Comment: NEGATIVE
Comment: NEGATIVE
Comment: NEGATIVE
Comment: NEGATIVE
Comment: NORMAL
Neisseria Gonorrhea: NEGATIVE
Trichomonas: NEGATIVE

## 2020-06-27 ENCOUNTER — Other Ambulatory Visit: Payer: Self-pay | Admitting: Certified Nurse Midwife

## 2020-06-27 MED ORDER — METRONIDAZOLE 500 MG PO TABS: 500.0000 mg | ORAL_TABLET | Freq: Two times a day (BID) | ORAL | 0 refills | Status: AC

## 2020-06-27 NOTE — Progress Notes (Signed)
Orders placed for BV. Pt notified via my chart.   Doreene Burke, CNM

## 2020-07-16 ENCOUNTER — Other Ambulatory Visit: Payer: Self-pay | Admitting: Neurology

## 2020-07-16 ENCOUNTER — Encounter: Payer: Self-pay | Admitting: Neurology

## 2020-07-16 MED ORDER — AIMOVIG 70 MG/ML ~~LOC~~ SOAJ: 70.0000 mg | SUBCUTANEOUS | 11 refills | Status: DC

## 2020-07-16 NOTE — Progress Notes (Addendum)
Rhiann Boucher (Key: [id number]) Rx #: [id number] Aimovig 70MG /ML auto-injectors   Form In[employer] Healthy [facility] Electronic [facility] Form [phone] NCPDP) Created 1 hour ago Sent to Plan 4 minutes ago Plan Response 4 minutes ago Submit Clinical Questions 2 minutes ago Determination Favorable 2 minutes ago Message from Plan PA Case: ([id number], Status: Approved, Coverage Starts on: 07/16/2020 12:00:00 AM, Coverage Ends on: 10/14/2020 12:00:00 AM.

## 2020-07-17 DIAGNOSIS — H5213 Myopia, bilateral: Secondary | ICD-10-CM | POA: Diagnosis not present

## 2020-07-21 ENCOUNTER — Other Ambulatory Visit: Payer: Self-pay | Admitting: Family Medicine

## 2020-07-21 DIAGNOSIS — M7552 Bursitis of left shoulder: Secondary | ICD-10-CM

## 2020-07-21 NOTE — Telephone Encounter (Signed)
Requested medication (s) are due for refill today:yes  Requested medication (s) are on the active medication list: yes  Last refill: 03/26/20  # 30  1 refill  Future visit scheduled no  Notes to clinic: not delegated  Requested Prescriptions  Pending Prescriptions Disp Refills   baclofen (LIORESAL) 10 MG tablet [Pharmacy Med Name: BACLOFEN 10 MG TABLET] 30 tablet 1    Sig: TAKE 1 TABLET (10 MG TOTAL) BY MOUTH AT BEDTIME AS NEEDED FOR MUSCLE SPASMS (SHOULDER PAIN).      Not Delegated - Analgesics:  Muscle Relaxants Failed - 07/21/2020  5:09 PM      Failed - This refill cannot be delegated      Passed - Valid encounter within last 6 months    Recent Outpatient Visits           4 months ago Rash and nonspecific skin eruption   [facility], Jodelle Gross, FNP   5 months ago Herpes simplex vulvovaginitis   [facility], Jodelle Gross, FNP   8 months ago Strep throat   [facility] Smitty Cords, DO   9 months ago Bursitis of left shoulder   [facility] Smitty Cords, DO   1 year ago Injury to cranial nerve, unspecified cranial nerve, subsequent encounter   [facility] Kyung Rudd, Alison Stalling, NP       Future Appointments             In 3 weeks Pasty Spillers, MD [facility] [place]

## 2020-07-23 NOTE — Telephone Encounter (Signed)
Pt called in an stated she stated a new job and works mon though Friday 8 to 4 and can not make an appt right now.  She would like to know if this can be refill this time and she can make an appt for next month

## 2020-07-24 ENCOUNTER — Other Ambulatory Visit: Payer: Self-pay | Admitting: Family Medicine

## 2020-07-24 DIAGNOSIS — R0602 Shortness of breath: Secondary | ICD-10-CM

## 2020-07-24 NOTE — Telephone Encounter (Signed)
I called and left a detail message on the patient vm.

## 2020-07-24 NOTE — Telephone Encounter (Signed)
This was sent in on 07/22/2020

## 2020-08-02 ENCOUNTER — Other Ambulatory Visit: Payer: Self-pay | Admitting: Family Medicine

## 2020-08-02 DIAGNOSIS — S049XXD Injury of unspecified cranial nerve, subsequent encounter: Secondary | ICD-10-CM

## 2020-08-12 ENCOUNTER — Ambulatory Visit: Payer: Medicaid Other | Admitting: Gastroenterology

## 2020-08-15 ENCOUNTER — Other Ambulatory Visit: Payer: Self-pay | Admitting: Family Medicine

## 2020-08-15 DIAGNOSIS — M7552 Bursitis of left shoulder: Secondary | ICD-10-CM

## 2020-08-15 NOTE — Telephone Encounter (Signed)
Requested medication (s) are due for refill today: no  Requested medication (s) are on the active medication list: yes  Last refill:  07/22/20 30 w/ 1 RF  Future visit scheduled: no  Notes to clinic:  med not delegated to NT to RF   Requested Prescriptions  Pending Prescriptions Disp Refills   baclofen (LIORESAL) 10 MG tablet [Pharmacy Med Name: BACLOFEN 10 MG TABLET] 30 tablet 1    Sig: TAKE 1 TABLET (10 MG TOTAL) BY MOUTH AT BEDTIME AS NEEDED FOR MUSCLE SPASMS (SHOULDER PAIN).      Not Delegated - Analgesics:  Muscle Relaxants Failed - 08/15/2020  2:33 PM      Failed - This refill cannot be delegated      Passed - Valid encounter within last 6 months    Recent Outpatient Visits           5 months ago Rash and nonspecific skin eruption   [facility], Jodelle Gross, FNP   6 months ago Herpes simplex vulvovaginitis   [facility], Jodelle Gross, [place]   9 months ago Strep throat   [facility] Smitty Cords, DO   10 months ago Bursitis of left shoulder   [facility] Smitty Cords, DO   1 year ago Injury to cranial nerve, unspecified cranial nerve, subsequent encounter   [facility] Kyung Rudd, Alison Stalling, NP       Future Appointments             In 1 month Pasty Spillers, MD [facility] [place]

## 2020-09-04 DIAGNOSIS — F4312 Post-traumatic stress disorder, chronic: Secondary | ICD-10-CM | POA: Diagnosis not present

## 2020-09-04 DIAGNOSIS — F900 Attention-deficit hyperactivity disorder, predominantly inattentive type: Secondary | ICD-10-CM | POA: Diagnosis not present

## 2020-09-07 ENCOUNTER — Other Ambulatory Visit: Payer: Self-pay | Admitting: Neurology

## 2020-09-07 ENCOUNTER — Ambulatory Visit: Payer: Self-pay | Admitting: Family Medicine

## 2020-09-07 ENCOUNTER — Telehealth: Payer: Self-pay | Admitting: Neurology

## 2020-09-07 MED ORDER — UBRELVY 100 MG PO TABS
1.0000 | ORAL_TABLET | ORAL | 5 refills | Status: DC | PRN
Start: 2020-09-07 — End: 2020-12-28

## 2020-09-07 NOTE — Telephone Encounter (Signed)
Script sent  

## 2020-09-07 NOTE — Telephone Encounter (Signed)
Patient called and said she had samples of Ubrelvy but she has taken all of them. She's requesting a prescription for Ubrelvy.  [facility] [address] in [place], [place]

## 2020-09-07 NOTE — Telephone Encounter (Signed)
Pt reports hand pain, both hands x 2 months "Or maybe longer." States 4-5/10, varies in intensity. Also reports fingers tingly at times, sometimes go numb. States pain states at hands "All the way up to my elbows." Denies swelling, no fever, Reports dizziness at times when going from sitting to standing, "But not really new"  States is on computer a lot with school but "I have been for years." No other injuries or repetitve  movements. Does report back pain "But I always have that." Appt made with Joni Reining for Wednesday per pt's schedule. DT screening done.Cre advise given, pt verbalizes understanding.  Reason for Disposition . [1] Numbness or tingling in one or both hands AND [2] is a chronic symptom (recurrent or ongoing AND present > 4 weeks)  Answer Assessment - Initial Assessment Questions 1. SYMPTOM: "What is the main symptom you are concerned about?" (e.g., weakness, numbness)     Hand pain, fingers numb at times 2. ONSET: "When did this start?" (minutes, hours, days; while sleeping)      2 months ago or longer 3. LAST NORMAL: "When was the last time you were normal (no symptoms)?"     2 months ago 4. PATTERN "Does this come and go, or has it been constant since it started?"  "Is it present now?"     Constant pain in both hands. 4-5/10 Some days worse. 5. CARDIAC SYMPTOMS: "Have you had any of the following symptoms: chest pain, difficulty breathing, palpitations?"     no 6. NEUROLOGIC SYMPTOMS: "Have you had any of the following symptoms: headache, dizziness, vision loss, double vision, changes in speech, unsteady on your feet?"    Dizzy when standing at times 7. OTHER SYMPTOMS: "Do you have any other symptoms?"     Numbness of all fingers at times. Starts in palm of hand.  Protocols used: NEUROLOGIC DEFICIT-A-AH

## 2020-09-08 ENCOUNTER — Other Ambulatory Visit: Payer: Self-pay | Admitting: Family Medicine

## 2020-09-08 ENCOUNTER — Encounter: Payer: Self-pay | Admitting: Neurology

## 2020-09-08 DIAGNOSIS — M7552 Bursitis of left shoulder: Secondary | ICD-10-CM

## 2020-09-08 NOTE — Telephone Encounter (Signed)
Requested medication (s) are due for refill today: no  Requested medication (s) are on the active medication list: yes  Last refill:  08/17/20  Future visit scheduled: yes  Notes to clinic:  med not delegated to NT to RF   Requested Prescriptions  Pending Prescriptions Disp Refills   baclofen (LIORESAL) 10 MG tablet [Pharmacy Med Name: BACLOFEN 10 MG TABLET] 30 tablet 1    Sig: TAKE 1 TABLET (10 MG TOTAL) BY MOUTH AT BEDTIME AS NEEDED FOR MUSCLE SPASMS (SHOULDER PAIN).      Not Delegated - Analgesics:  Muscle Relaxants Failed - 09/08/2020 10:32 AM      Failed - This refill cannot be delegated      Passed - Valid encounter within last 6 months    Recent Outpatient Visits           5 months ago Rash and nonspecific skin eruption   [facility], Jodelle Gross, FNP   7 months ago Herpes simplex vulvovaginitis   [facility], Jodelle Gross, [place]   10 months ago Strep throat   [facility] Smitty Cords, DO   11 months ago Bursitis of left shoulder   [facility] Smitty Cords, DO   1 year ago Injury to cranial nerve, unspecified cranial nerve, subsequent encounter   [facility] Kyung Rudd, Alison Stalling, NP       Future Appointments             Tomorrow Malfi, Jodelle Gross, FNP [facility]   In 3 weeks Pasty Spillers, MD [facility] [place]

## 2020-09-08 NOTE — Progress Notes (Addendum)
Shaliah Wann (Key: [id number]) Bernita Raisin 100MG  tablets   Form IngenioRx Healthy [facility] Electronic [facility] Form [phone] NCPDP) Created 4 minutes ago Sent to Plan 2 minutes ago Plan Response 2 minutes ago Submit Clinical Questions less than a minute ago Determination Favorable less than a minute ago Message from Plan PA Case: ([id number], Status: Approved, Coverage Starts on: 09/08/2020 12:00:00 AM, Coverage Ends on: 09/08/2021 12:00:00 AM.

## 2020-09-09 ENCOUNTER — Other Ambulatory Visit: Payer: Self-pay

## 2020-09-09 ENCOUNTER — Ambulatory Visit: Payer: Medicaid Other | Admitting: Family Medicine

## 2020-09-09 ENCOUNTER — Encounter: Payer: Self-pay | Admitting: Family Medicine

## 2020-09-09 VITALS — BP 103/63 | HR 115 | Temp 98.6°F | Resp 18 | Ht 63.0 in | Wt 113.6 lb

## 2020-09-09 DIAGNOSIS — G5603 Carpal tunnel syndrome, bilateral upper limbs: Secondary | ICD-10-CM | POA: Insufficient documentation

## 2020-09-09 NOTE — Patient Instructions (Signed)
A referral to Orthopedics has been placed today.  If you have not heard from the specialty office or our referral coordinator within 1 week, please let us know and we will follow up with the referral coordinator for an update.  Can use ibuprofen and use wrist braces to help support and keep the tunnels open.  You will receive a survey after today's visit either digitally by e-mail or paper by [employer]. Your experiences and feedback matter to [place].  Please respond so we know how we are doing as we provide care for you.  Call us with any questions/concerns/needs.  It is my goal to be available to you for your health concerns.  Thanks for choosing me to be a partner in your healthcare needs!  Charlaine Dalton, FNP-C Family Nurse Practitioner [facility] Phone: [phone]

## 2020-09-09 NOTE — Assessment & Plan Note (Signed)
Bilateral carpal tunnel syndrome, discussed using ibuprofen, can use wrist braces, referral to Orthopedics placed due to duration of symptoms without resolution.
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
## 2020-09-23 ENCOUNTER — Encounter: Payer: Self-pay | Admitting: Family Medicine

## 2020-09-23 ENCOUNTER — Other Ambulatory Visit (HOSPITAL_COMMUNITY)
Admission: RE | Admit: 2020-09-23 | Discharge: 2020-09-23 | Disposition: A | Discharge status: Home / Self Care | Payer: Medicaid Other | Source: Ambulatory Visit | Attending: Family Medicine | Admitting: Family Medicine

## 2020-09-23 ENCOUNTER — Ambulatory Visit (INDEPENDENT_AMBULATORY_CARE_PROVIDER_SITE_OTHER): Payer: Medicaid Other | Admitting: Family Medicine

## 2020-09-23 ENCOUNTER — Other Ambulatory Visit: Payer: Self-pay

## 2020-09-23 VITALS — BP 110/64 | HR 102 | Temp 97.8°F | Resp 16 | Ht 62.0 in | Wt 116.0 lb

## 2020-09-23 DIAGNOSIS — Z113 Encounter for screening for infections with a predominantly sexual mode of transmission: Secondary | ICD-10-CM | POA: Insufficient documentation

## 2020-09-23 DIAGNOSIS — N898 Other specified noninflammatory disorders of vagina: Secondary | ICD-10-CM | POA: Diagnosis not present

## 2020-09-23 NOTE — Patient Instructions (Addendum)
Thank you for coming to the office today.  Self collect swab today checking for Chlamydia, Gonorrohea, Trichmonas, Bacterial Vaginosis and Yeast.  Lab test for HIV / RPR syphilis can be done at [facility] or here.  Stay tuned for results.  Please schedule a Follow-up Appointment to: Return if symptoms worsen or fail to improve.  If you have any other questions or concerns, please feel free to call the office or send a message through MyChart. You may also schedule an earlier appointment if necessary.  Additionally, you may be receiving a survey about your experience at our office within a few days to 1 week by e-mail or mail. We value your feedback.  Saralyn Pilar, DO [facility] [place]
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
## 2020-09-24 LAB — RPR: RPR Ser Ql: NONREACTIVE

## 2020-09-24 LAB — HIV ANTIBODY (ROUTINE TESTING W REFLEX): HIV 1&2 Ab, 4th Generation: NONREACTIVE

## 2020-09-28 ENCOUNTER — Ambulatory Visit: Payer: Medicaid Other | Attending: Internal Medicine

## 2020-09-28 DIAGNOSIS — Z23 Encounter for immunization: Secondary | ICD-10-CM

## 2020-09-28 LAB — CERVICOVAGINAL ANCILLARY ONLY
Bacterial Vaginitis (gardnerella): NEGATIVE
Candida Glabrata: NEGATIVE
Candida Vaginitis: NEGATIVE
Chlamydia: NEGATIVE
Comment: NEGATIVE
Comment: NEGATIVE
Comment: NEGATIVE
Comment: NEGATIVE
Comment: NEGATIVE
Comment: NORMAL
Neisseria Gonorrhea: NEGATIVE
Trichomonas: NEGATIVE

## 2020-09-28 NOTE — Progress Notes (Signed)
° °  Covid-19 Vaccination Clinic  Name:  ADEOLA DENNEN    MRN: [id number] DOB: 1986-07-24  09/28/2020  Ms. Misch was observed post Covid-19 immunization for 15 minutes without incident. She was provided with Vaccine Information Sheet and instruction to access the V-Safe system.   Ms. Guardado was instructed to call 911 with any severe reactions post vaccine:  Difficulty breathing   Swelling of face and throat   A fast heartbeat   A bad rash all over body   Dizziness and weakness

## 2020-09-30 ENCOUNTER — Encounter: Payer: Self-pay | Admitting: Gastroenterology

## 2020-09-30 ENCOUNTER — Ambulatory Visit (INDEPENDENT_AMBULATORY_CARE_PROVIDER_SITE_OTHER): Payer: Medicaid Other | Admitting: Gastroenterology

## 2020-09-30 ENCOUNTER — Other Ambulatory Visit: Payer: Self-pay

## 2020-09-30 VITALS — BP 104/71 | HR 118 | Temp 98.0°F | Ht 62.0 in | Wt 116.0 lb

## 2020-09-30 DIAGNOSIS — R140 Abdominal distension (gaseous): Secondary | ICD-10-CM | POA: Diagnosis not present

## 2020-09-30 MED ORDER — PANTOPRAZOLE SODIUM 40 MG PO TBEC: 40.0000 mg | DELAYED_RELEASE_TABLET | Freq: Two times a day (BID) | ORAL | 0 refills | Status: DC

## 2020-09-30 NOTE — Progress Notes (Signed)
Melodie Bouillon, MD [address]  [address]  [place], [place] [address]  Main: [phone]  Fax: [phone]   Primary Care Physician: Tarri Fuller, FNP   Chief Complaint  Patient presents with  . Abdominal Pain    HPI: ARAIYA TILMON is a 34 y.o. female here for follow-up of abdominal pain.  Epigastric, 5/10, nonradiating, occurs with and without meals.  Reports abdominal bloating more than pain.  Goes multiple days without a bowel movement and when she does have a bowel movement she has 2-3 loose bowel movements on those days.  No blood in stool.  No family history of colon cancer.  No prior EGD or colonoscopy.  H. pylori breath test - April 2021.  Prilosec was given on last visit and she states it made her more bloated and so she discontinued it and it did not help her symptoms.  Current Outpatient Medications  Medication Sig Dispense Refill  . ALPRAZolam (XANAX XR) 0.5 MG 24 hr tablet Take 0.5 mg by mouth as needed for anxiety.    Marland Kitchen amphetamine-dextroamphetamine (ADDERALL XR) 5 MG 24 hr capsule Take 5 mg by mouth daily.    . baclofen (LIORESAL) 10 MG tablet TAKE 1 TABLET (10 MG TOTAL) BY MOUTH AT BEDTIME AS NEEDED FOR MUSCLE SPASMS (SHOULDER PAIN). 30 tablet 1  . DULERA 200-5 MCG/ACT AERO TAKE 2 PUFFS BY MOUTH TWICE A DAY 13 g 1  . Erenumab-aooe (AIMOVIG) 70 MG/ML SOAJ Inject 70 mg into the skin every 28 (twenty-eight) days. 1 mL 11  . gabapentin (NEURONTIN) 100 MG capsule TAKE 100 MG (1 CAP) IN MORNING AND 200MG  (2 CAPS) AT BEDTIME. 90 capsule 2  . loratadine (CLARITIN) 10 MG tablet Take 10 mg by mouth daily.    . montelukast (SINGULAIR) 10 MG tablet TAKE 1 TABLET BY MOUTH EVERYDAY AT BEDTIME 90 tablet 0  . naproxen (NAPROSYN) 500 MG tablet TAKE 1 TABLET (500 MG TOTAL) BY MOUTH 2 (TWO) TIMES DAILY WITH A MEAL. FOR 2-4 WEEKS THEN AS NEEDED 60 tablet 1  . norethindrone-ethinyl estradiol 1/35 (ORTHO-NOVUM 1/35, 28,) tablet Take 1 tablet by mouth daily. 1 Package 11  .  PROAIR HFA 108 (90 Base) MCG/ACT inhaler INHALE 1 TO 2 PUFFS INTO THE LUNGS EVERY 6 (SIX) HOURS AS NEEDED FOR SHORTNESS OF BREATH. 8.5 g 2  . Ubrogepant (UBRELVY) 100 MG TABS Take 1 tablet by mouth as needed (May repeat in 2 hours if needed.  Maximum 2 tablets in 24 hours.). 10 tablet 5  . pantoprazole (PROTONIX) 40 MG tablet Take 1 tablet (40 mg total) by mouth 2 (two) times daily. 56 tablet 0   No current facility-administered medications for this visit.    Allergies as of 09/30/2020 - Review Complete 09/30/2020  Allergen Reaction Noted  . Other Rash 05/12/2020  . Penicillin g Hives 05/12/2020  . Penicillins Hives 04/28/2015  . Latex Rash 02/27/2014    ROS:  General: Negative for anorexia, weight loss, fever, chills, fatigue, weakness. ENT: Negative for hoarseness, difficulty swallowing , nasal congestion. CV: Negative for chest pain, angina, palpitations, dyspnea on exertion, peripheral edema.  Respiratory: Negative for dyspnea at rest, dyspnea on exertion, cough, sputum, wheezing.  GI: See history of present illness. GU:  Negative for dysuria, hematuria, urinary incontinence, urinary frequency, nocturnal urination.  Endo: Negative for unusual weight change.    Physical Examination:   BP 104/71   Pulse (!) 118   Temp 98 F (36.7 C) (Oral)  Ht 5\' 2"  (1.575 m)   Wt 116 lb (52.6 kg)   BMI 21.22 kg/m   General: Well-nourished, well-developed in no acute distress.  Eyes: No icterus. Conjunctivae pink. Mouth: Oropharyngeal mucosa moist and pink , no lesions erythema or exudate. Neck: Supple, Trachea midline Abdomen: Bowel sounds are normal, nontender, nondistended, no hepatosplenomegaly or masses, no abdominal bruits or hernia , no rebound or guarding.   Extremities: No lower extremity edema. No clubbing or deformities. Neuro: Alert and oriented x 3.  Grossly intact. Skin: Warm and dry, no jaundice.   Psych: Alert and cooperative, normal mood and affect.   Labs: CMP       Component Value Date/Time   NA 139 03/16/2020 1021   NA 140 03/14/2014 1156   K 4.1 03/16/2020 1021   K 3.5 03/14/2014 1156   CL 105 03/16/2020 1021   CL 108 (H) 03/14/2014 1156   CO2 26 03/16/2020 1021   CO2 26 03/14/2014 1156   GLUCOSE 112 03/16/2020 1021   GLUCOSE 82 03/14/2014 1156   BUN 18 03/16/2020 1021   BUN 9 03/14/2014 1156   CREATININE 0.69 03/16/2020 1021   CALCIUM 9.6 03/16/2020 1021   CALCIUM 8.3 (L) 03/14/2014 1156   PROT 6.6 03/16/2020 1021   ALBUMIN 4.3 12/28/2015 1032   AST 13 03/16/2020 1021   ALT 15 03/16/2020 1021   ALKPHOS 56 12/28/2015 1032   BILITOT 0.5 03/16/2020 1021   GFRNONAA 115 03/16/2020 1021   GFRAA 134 03/16/2020 1021   Lab Results  Component Value Date   WBC 7.0 03/16/2020   HGB 12.2 03/16/2020   HCT 35.9 03/16/2020   MCV 93.7 03/16/2020   PLT 283 03/16/2020    Imaging Studies: No results found.  Assessment and Plan:   MONESHA MONREAL is a 34 y.o. y/o female here for follow-up of abdominal pain and bloating  Patient does state that she has anxiety but works in [employer] and despite working on those issues she continues to have above symptoms  We discussed the possibility of endoscopy again, but patient refuses at this time  We will try higher dose of PPI.  Prescribed Protonix twice daily for 4 weeks and reassess symptoms  (Risks of PPI use were discussed with patient including bone loss, C. Diff diarrhea, pneumonia, infections, CKD, electrolyte abnormalities.  Pt. Verbalizes understanding and chooses to continue the medication.)  Patient encouraged to reconsider endoscopy if symptoms continue  Since she is having constipation, her bloating is likely due to that  MiraLAX daily encouraged  FODMAP diet discussed and handout given for the same    Dr Print production planner

## 2020-09-30 NOTE — Patient Instructions (Addendum)
Please start taking Protonix 40 MG 1 capsule daily twice a day for 28 days and then stop.  Please take Miralax 17 G daily.

## 2020-10-01 ENCOUNTER — Other Ambulatory Visit: Payer: Self-pay | Admitting: Family Medicine

## 2020-10-01 DIAGNOSIS — M7552 Bursitis of left shoulder: Secondary | ICD-10-CM

## 2020-10-01 NOTE — Telephone Encounter (Signed)
Requested medication (s) are due for refill today: yes   Requested medication (s) are on the active medication list: yes   Last refill:  09/08/20 #30 1 refills   Future visit scheduled: yes with GI only   Notes to clinic:  not delegated per protocol     Requested Prescriptions  Pending Prescriptions Disp Refills   baclofen (LIORESAL) 10 MG tablet [Pharmacy Med Name: BACLOFEN 10 MG TABLET] 30 tablet 1    Sig: TAKE 1 TABLET (10 MG TOTAL) BY MOUTH AT BEDTIME AS NEEDED FOR MUSCLE SPASMS (SHOULDER PAIN).      Not Delegated - Analgesics:  Muscle Relaxants Failed - 10/01/2020 10:35 AM      Failed - This refill cannot be delegated      Passed - Valid encounter within last 6 months    Recent Outpatient Visits           1 week ago Screening examination for STD (sexually transmitted disease)   [facility] Smitty Cords, DO   3 weeks ago Bilateral carpal tunnel syndrome   [facility], Jodelle Gross, FNP   6 months ago Rash and nonspecific skin eruption   [facility], Jodelle Gross, FNP   7 months ago Herpes simplex vulvovaginitis   [facility], Jodelle Gross, [place]   11 months ago Strep throat   [facility] Smitty Cords, DO       Future Appointments             In 3 months Pasty Spillers, MD [facility] [place]

## 2020-10-06 DIAGNOSIS — F4312 Post-traumatic stress disorder, chronic: Secondary | ICD-10-CM | POA: Diagnosis not present

## 2020-10-06 DIAGNOSIS — F900 Attention-deficit hyperactivity disorder, predominantly inattentive type: Secondary | ICD-10-CM | POA: Diagnosis not present

## 2020-10-18 ENCOUNTER — Other Ambulatory Visit: Payer: Self-pay | Admitting: Family Medicine

## 2020-10-18 DIAGNOSIS — R0602 Shortness of breath: Secondary | ICD-10-CM

## 2020-10-18 DIAGNOSIS — J4520 Mild intermittent asthma, uncomplicated: Secondary | ICD-10-CM

## 2020-10-20 ENCOUNTER — Other Ambulatory Visit: Payer: Self-pay | Admitting: Gastroenterology

## 2020-10-26 ENCOUNTER — Other Ambulatory Visit: Payer: Self-pay | Admitting: Family Medicine

## 2020-10-26 DIAGNOSIS — M7552 Bursitis of left shoulder: Secondary | ICD-10-CM

## 2020-10-26 NOTE — Telephone Encounter (Signed)
Requested medication (s) are due for refill today - should be able to refill  Requested medication (s) are on the active medication list -yes  Future visit scheduled -no  Last refill: 10/01/20 #30 1 RF  Notes to clinic: Request RF non delegated Rx  Requested Prescriptions  Pending Prescriptions Disp Refills   baclofen (LIORESAL) 10 MG tablet [Pharmacy Med Name: BACLOFEN 10 MG TABLET] 30 tablet 1    Sig: TAKE 1 TABLET (10 MG TOTAL) BY MOUTH AT BEDTIME AS NEEDED FOR MUSCLE SPASMS (SHOULDER PAIN).      Not Delegated - Analgesics:  Muscle Relaxants Failed - 10/26/2020  9:31 AM      Failed - This refill cannot be delegated      Passed - Valid encounter within last 6 months    Recent Outpatient Visits           1 month ago Screening examination for STD (sexually transmitted disease)   [facility] Smitty Cords, DO   1 month ago Bilateral carpal tunnel syndrome   [facility], Jodelle Gross, FNP   7 months ago Rash and nonspecific skin eruption   [facility], Jodelle Gross, FNP   8 months ago Herpes simplex vulvovaginitis   [facility], Jodelle Gross, [place]   12 months ago Strep throat   [facility] Althea Charon, Netta Neat, DO       Future Appointments             In 2 months Pasty Spillers, MD [facility] [place]                Requested Prescriptions  Pending Prescriptions Disp Refills   baclofen (LIORESAL) 10 MG tablet [Pharmacy Med Name: BACLOFEN 10 MG TABLET] 30 tablet 1    Sig: TAKE 1 TABLET (10 MG TOTAL) BY MOUTH AT BEDTIME AS NEEDED FOR MUSCLE SPASMS (SHOULDER PAIN).      Not Delegated - Analgesics:  Muscle Relaxants Failed - 10/26/2020  9:31 AM      Failed - This refill cannot be delegated      Passed - Valid encounter within last 6 months    Recent Outpatient Visits           1 month ago Screening examination for STD (sexually transmitted disease)   [facility] Smitty Cords, DO   1 month ago Bilateral carpal tunnel syndrome   [facility], Jodelle Gross, FNP   7 months ago Rash and nonspecific skin eruption   [facility], Jodelle Gross, FNP   8 months ago Herpes simplex vulvovaginitis   [facility], Jodelle Gross, [place]   12 months ago Strep throat   [facility] Smitty Cords, DO       Future Appointments             In 2 months Pasty Spillers, MD [facility] [place]

## 2020-11-03 ENCOUNTER — Other Ambulatory Visit: Payer: Self-pay | Admitting: Family Medicine

## 2020-11-03 DIAGNOSIS — S049XXD Injury of unspecified cranial nerve, subsequent encounter: Secondary | ICD-10-CM

## 2020-11-04 ENCOUNTER — Encounter: Payer: Self-pay | Admitting: Neurology

## 2020-11-04 NOTE — Progress Notes (Signed)
[name] (Key: [id number]) Rx #: [id number] Aimovig 70MG /ML auto-injectors   Form [employer] [facility] Electronic [facility] Form [id number] NCPDP) Created 1 hour ago Sent to Plan 24 minutes ago Plan Response 24 minutes ago Submit Clinical Questions 23 minutes ago Determination Favorable 23 minutes ago Message from Plan PA Case: ([id number], Status: Approved, Coverage Starts on: 11/04/2020 12:00:00 AM, Coverage Ends on: 11/04/2021 12:00:00 AM.

## 2020-11-17 ENCOUNTER — Other Ambulatory Visit: Payer: Self-pay | Admitting: Family Medicine

## 2020-11-17 DIAGNOSIS — M7552 Bursitis of left shoulder: Secondary | ICD-10-CM

## 2020-11-17 NOTE — Telephone Encounter (Signed)
Requested medication (s) are due for refill today: due 11/26/20  Requested medication (s) are on the active medication list: yes   Last refill:  10/26/20 #30 1 refill   Future visit scheduled: no  Notes to clinic:  not delegated per protocol     Requested Prescriptions  Pending Prescriptions Disp Refills   baclofen (LIORESAL) 10 MG tablet [Pharmacy Med Name: BACLOFEN 10 MG TABLET] 30 tablet 1    Sig: TAKE 1 TABLET (10 MG TOTAL) BY MOUTH AT BEDTIME AS NEEDED FOR MUSCLE SPASMS (SHOULDER PAIN).      Not Delegated - Analgesics:  Muscle Relaxants Failed - 11/17/2020 12:35 PM      Failed - This refill cannot be delegated      Passed - Valid encounter within last 6 months    Recent Outpatient Visits           1 month ago Screening examination for STD (sexually transmitted disease)   [facility] Smitty Cords, DO   2 months ago Bilateral carpal tunnel syndrome   [facility], Jodelle Gross, FNP   8 months ago Rash and nonspecific skin eruption   [facility], Jodelle Gross, FNP   9 months ago Herpes simplex vulvovaginitis   [facility], Jodelle Gross, FNP   1 year ago Strep throat   [facility] Smitty Cords, DO       Future Appointments             In 1 month Maximino Greenland, Dolphus Jenny, MD [facility] [place]

## 2020-11-24 ENCOUNTER — Other Ambulatory Visit: Payer: Self-pay

## 2020-11-24 ENCOUNTER — Telehealth: Payer: Self-pay

## 2020-11-24 ENCOUNTER — Telehealth: Payer: Medicaid Other | Admitting: Family Medicine

## 2020-11-24 ENCOUNTER — Other Ambulatory Visit: Payer: Medicaid Other

## 2020-11-24 DIAGNOSIS — Z20822 Contact with and (suspected) exposure to covid-19: Secondary | ICD-10-CM

## 2020-11-24 NOTE — Telephone Encounter (Signed)
The pt was on the schedule today with Dr. Althea Charon for possible COVID due to exposure to several people whom was diagnose with COVID. Virtual appt was cancelled because the patient is asymptomatic and is just requesting a COVID test. I scheduled the patient for a nurse visit at 11:45am to drive up and get a COVID test.

## 2020-11-27 ENCOUNTER — Encounter: Payer: Self-pay | Admitting: Family Medicine

## 2020-12-01 DIAGNOSIS — F4312 Post-traumatic stress disorder, chronic: Secondary | ICD-10-CM | POA: Diagnosis not present

## 2020-12-01 DIAGNOSIS — F330 Major depressive disorder, recurrent, mild: Secondary | ICD-10-CM | POA: Diagnosis not present

## 2020-12-01 DIAGNOSIS — F900 Attention-deficit hyperactivity disorder, predominantly inattentive type: Secondary | ICD-10-CM | POA: Diagnosis not present

## 2020-12-04 LAB — SPECIMEN STATUS REPORT

## 2020-12-04 LAB — NOVEL CORONAVIRUS, NAA: SARS-CoV-2, NAA: NOT DETECTED

## 2020-12-12 ENCOUNTER — Other Ambulatory Visit: Payer: Self-pay | Admitting: Family Medicine

## 2020-12-12 DIAGNOSIS — M7552 Bursitis of left shoulder: Secondary | ICD-10-CM

## 2020-12-15 ENCOUNTER — Ambulatory Visit
Admission: EM | Admit: 2020-12-15 | Discharge: 2020-12-15 | Disposition: A | Discharge status: Home / Self Care | Payer: Medicaid Other | Attending: Emergency Medicine | Admitting: Emergency Medicine

## 2020-12-15 ENCOUNTER — Other Ambulatory Visit: Payer: Self-pay

## 2020-12-15 ENCOUNTER — Encounter: Payer: Self-pay | Admitting: Emergency Medicine

## 2020-12-15 DIAGNOSIS — H66003 Acute suppurative otitis media without spontaneous rupture of ear drum, bilateral: Secondary | ICD-10-CM | POA: Diagnosis not present

## 2020-12-15 MED ORDER — DOXYCYCLINE HYCLATE 100 MG PO CAPS: 100.0000 mg | ORAL_CAPSULE | Freq: Two times a day (BID) | ORAL | 0 refills | Status: DC

## 2020-12-15 NOTE — Discharge Instructions (Signed)
The doxycycline twice daily for 10 days with food for your ear infections.  Perform sinus irrigation 2-3 times a day with distilled water and a [employer] sinus rinse kit.  If your symptoms do not prove following this 10-day course of antibiotics, given the extended duration of your symptoms, you need to follow-up with ear nose and throat. 

## 2020-12-15 NOTE — ED Triage Notes (Signed)
Pt presents with pressure to both ear, +ringing, intermittent pain x 1.5 wks.
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
## 2020-12-28 ENCOUNTER — Other Ambulatory Visit: Payer: Self-pay

## 2020-12-28 ENCOUNTER — Encounter: Payer: Self-pay | Admitting: Neurology

## 2020-12-28 ENCOUNTER — Telehealth (INDEPENDENT_AMBULATORY_CARE_PROVIDER_SITE_OTHER): Payer: Medicaid Other | Admitting: Neurology

## 2020-12-28 VITALS — Ht 62.0 in | Wt 110.0 lb

## 2020-12-28 DIAGNOSIS — S049XXD Injury of unspecified cranial nerve, subsequent encounter: Secondary | ICD-10-CM | POA: Diagnosis not present

## 2020-12-28 DIAGNOSIS — M7552 Bursitis of left shoulder: Secondary | ICD-10-CM

## 2020-12-28 DIAGNOSIS — R202 Paresthesia of skin: Secondary | ICD-10-CM | POA: Diagnosis not present

## 2020-12-28 MED ORDER — UBRELVY 100 MG PO TABS: 1.0000 | ORAL_TABLET | ORAL | 5 refills | Status: DC | PRN

## 2020-12-28 MED ORDER — GABAPENTIN 100 MG PO CAPS: 200.0000 mg | ORAL_CAPSULE | Freq: Two times a day (BID) | ORAL | 5 refills | Status: DC

## 2020-12-28 MED ORDER — BACLOFEN 10 MG PO TABS: 10.0000 mg | ORAL_TABLET | Freq: Every evening | ORAL | 5 refills | Status: DC | PRN

## 2021-01-01 ENCOUNTER — Other Ambulatory Visit: Payer: Self-pay | Admitting: Nurse Practitioner

## 2021-01-01 ENCOUNTER — Other Ambulatory Visit: Payer: Self-pay | Admitting: Certified Nurse Midwife

## 2021-01-06 DIAGNOSIS — F4312 Post-traumatic stress disorder, chronic: Secondary | ICD-10-CM | POA: Diagnosis not present

## 2021-01-06 DIAGNOSIS — Z79899 Other long term (current) drug therapy: Secondary | ICD-10-CM | POA: Diagnosis not present

## 2021-01-06 DIAGNOSIS — F900 Attention-deficit hyperactivity disorder, predominantly inattentive type: Secondary | ICD-10-CM | POA: Diagnosis not present

## 2021-01-06 DIAGNOSIS — F330 Major depressive disorder, recurrent, mild: Secondary | ICD-10-CM | POA: Diagnosis not present

## 2021-01-07 DIAGNOSIS — H903 Sensorineural hearing loss, bilateral: Secondary | ICD-10-CM | POA: Diagnosis not present

## 2021-01-07 DIAGNOSIS — H9313 Tinnitus, bilateral: Secondary | ICD-10-CM | POA: Diagnosis not present

## 2021-01-11 ENCOUNTER — Other Ambulatory Visit: Payer: Self-pay | Admitting: Neurology

## 2021-01-11 MED ORDER — AIMOVIG 140 MG/ML ~~LOC~~ SOAJ: 140.0000 mg | SUBCUTANEOUS | 5 refills | Status: DC

## 2021-01-12 ENCOUNTER — Other Ambulatory Visit: Payer: Self-pay

## 2021-01-12 ENCOUNTER — Ambulatory Visit: Payer: Medicaid Other | Admitting: Gastroenterology

## 2021-01-12 MED ORDER — AIMOVIG 70 MG/ML ~~LOC~~ SOAJ: 70.0000 mg | Freq: Once | SUBCUTANEOUS | 0 refills | Status: AC

## 2021-01-23 ENCOUNTER — Other Ambulatory Visit: Payer: Self-pay | Admitting: Certified Nurse Midwife

## 2021-01-25 ENCOUNTER — Ambulatory Visit (INDEPENDENT_AMBULATORY_CARE_PROVIDER_SITE_OTHER): Payer: Medicaid Other | Admitting: Gastroenterology

## 2021-01-25 ENCOUNTER — Other Ambulatory Visit: Payer: Self-pay

## 2021-01-25 VITALS — BP 105/74 | HR 139 | Temp 97.2°F | Wt 108.0 lb

## 2021-01-25 DIAGNOSIS — R140 Abdominal distension (gaseous): Secondary | ICD-10-CM | POA: Diagnosis not present

## 2021-01-25 DIAGNOSIS — R109 Unspecified abdominal pain: Secondary | ICD-10-CM | POA: Diagnosis not present

## 2021-01-25 MED ORDER — PANTOPRAZOLE SODIUM 40 MG PO TBEC: 40.0000 mg | DELAYED_RELEASE_TABLET | Freq: Every day | ORAL | 0 refills | Status: DC

## 2021-01-25 NOTE — Progress Notes (Signed)
Angela Bouillon, MD [address]  [address]  [place], [place] [address]  Main: [phone]  Fax: [phone]   Primary Care Physician: Tarri Fuller, FNP   Chief Complaint  Patient presents with  . Abdominal Pain    HPI: [name] is a 35 y.o. female here for follow-up of abdominal pain.  Patient was given PPI on last visit that she took twice daily for 1 month and states symptoms were much improved on the medication.  When she ran out of the medication, symptoms have reoccurred.  She describes epigastric pain, no nausea or vomiting.  No altered bowel habits or blood in stool.  Reports decreased appetite due to the pain.  No dysphagia.  Current Outpatient Medications  Medication Sig Dispense Refill  . ALAYCEN 1/35 tablet TAKE 1 TABLET BY MOUTH EVERY DAY 84 tablet 0  . ALPRAZolam (XANAX XR) 0.5 MG 24 hr tablet Take 0.5 mg by mouth as needed for anxiety.    Marland Kitchen amphetamine-dextroamphetamine (ADDERALL XR) 5 MG 24 hr capsule Take 5 mg by mouth daily.    . baclofen (LIORESAL) 10 MG tablet Take 1 tablet (10 mg total) by mouth at bedtime as needed for muscle spasms. 30 each 5  . DULERA 200-5 MCG/ACT AERO TAKE 2 PUFFS BY MOUTH TWICE A DAY 13 each 1  . Erenumab-aooe (AIMOVIG) 140 MG/ML SOAJ Inject 140 mg into the skin every 28 (twenty-eight) days. 1.12 mL 5  . gabapentin (NEURONTIN) 100 MG capsule Take 2 capsules (200 mg total) by mouth 2 (two) times daily. 120 capsule 5  . loratadine (CLARITIN) 10 MG tablet Take 10 mg by mouth daily.    . montelukast (SINGULAIR) 10 MG tablet TAKE 1 TABLET BY MOUTH EVERYDAY AT BEDTIME 90 tablet 0  . naproxen (NAPROSYN) 500 MG tablet TAKE 1 TABLET (500 MG TOTAL) BY MOUTH 2 (TWO) TIMES DAILY WITH A MEAL. FOR 2-4 WEEKS THEN AS NEEDED 60 tablet 1  . PROAIR HFA 108 (90 Base) MCG/ACT inhaler INHALE 1 TO 2 PUFFS INTO THE LUNGS EVERY 6 (SIX) HOURS AS NEEDED FOR SHORTNESS OF BREATH. 8.5 g 2  . Ubrogepant (UBRELVY) 100 MG TABS Take 1 tablet by mouth as  needed (May repeat in 2 hours if needed.  Maximum 2 tablets in 24 hours.). 10 tablet 5   No current facility-administered medications for this visit.    Allergies as of 01/25/2021 - Review Complete 01/25/2021  Allergen Reaction Noted  . Other Rash 05/12/2020  . Penicillin g Hives 05/12/2020  . Penicillins Hives 04/28/2015  . Latex Rash 02/27/2014    ROS:  General: Negative for anorexia, weight loss, fever, chills, fatigue, weakness. ENT: Negative for hoarseness, difficulty swallowing , nasal congestion. CV: Negative for chest pain, angina, palpitations, dyspnea on exertion, peripheral edema.  Respiratory: Negative for dyspnea at rest, dyspnea on exertion, cough, sputum, wheezing.  GI: See history of present illness. GU:  Negative for dysuria, hematuria, urinary incontinence, urinary frequency, nocturnal urination.  Endo: Negative for unusual weight change.    Physical Examination:   BP 105/74   Pulse (!) 139   Temp (!) 97.2 F (36.2 C) (Oral)   Wt 108 lb (49 kg)   BMI 19.75 kg/m   General: Well-nourished, well-developed in no acute distress.  Eyes: No icterus. Conjunctivae pink. Mouth: Oropharyngeal mucosa moist and pink , no lesions erythema or exudate. Neck: Supple, Trachea midline Abdomen: Bowel sounds are normal, nontender, nondistended, no hepatosplenomegaly or masses, no abdominal bruits or  hernia , no rebound or guarding.   Extremities: No lower extremity edema. No clubbing or deformities. Neuro: Alert and oriented x 3.  Grossly intact. Skin: Warm and dry, no jaundice.   Psych: Alert and cooperative, normal mood and affect.   Labs: CMP     Component Value Date/Time   NA 139 03/16/2020 1021   NA 140 03/14/2014 1156   K 4.1 03/16/2020 1021   K 3.5 03/14/2014 1156   CL 105 03/16/2020 1021   CL 108 (H) 03/14/2014 1156   CO2 26 03/16/2020 1021   CO2 26 03/14/2014 1156   GLUCOSE 112 03/16/2020 1021   GLUCOSE 82 03/14/2014 1156   BUN 18 03/16/2020 1021   BUN  9 03/14/2014 1156   CREATININE 0.69 03/16/2020 1021   CALCIUM 9.6 03/16/2020 1021   CALCIUM 8.3 (L) 03/14/2014 1156   PROT 6.6 03/16/2020 1021   ALBUMIN 4.3 12/28/2015 1032   AST 13 03/16/2020 1021   ALT 15 03/16/2020 1021   ALKPHOS 56 12/28/2015 1032   BILITOT 0.5 03/16/2020 1021   GFRNONAA 115 03/16/2020 1021   GFRAA 134 03/16/2020 1021   Lab Results  Component Value Date   WBC 7.0 03/16/2020   HGB 12.2 03/16/2020   HCT 35.9 03/16/2020   MCV 93.7 03/16/2020   PLT 283 03/16/2020    Imaging Studies: No results found.  Assessment and Plan:   [name] is a 35 y.o. y/o female here for follow-up of abdominal pain  Patient would like to continue with conservative management  Patient took PPI twice daily for 4 weeks with improvement in symptoms while taking the meds Start PPI once daily and continue for 2 to 3 months if symptoms improve  If symptoms do not improve in the next 2 to 3 weeks, patient advised to call us back and we can increase the medication to twice daily  However, I have discussed that she should consider upper endoscopy if symptoms do not improve  Would also need to consider upper endoscopy if symptoms return after discontinuing medication after 2 to 3 months  Patient verbalized understanding of the above  (Risks of PPI use were discussed with patient including bone loss, C. Diff diarrhea, pneumonia, infections, CKD, electrolyte abnormalities.  Pt. Verbalizes understanding and chooses to continue the medication.)  Check H. pylori serology due to ongoing symptoms   Dr [name]

## 2021-01-27 LAB — H PYLORI, IGM, IGG, IGA AB
H pylori, IgM Abs: <9 units (ref 0.0–8.9)
H. pylori, IgA Abs: <9 units (ref 0.0–8.9)
H. pylori, IgG AbS: 0.31 Index Value (ref 0.00–0.79)

## 2021-02-03 DIAGNOSIS — F4312 Post-traumatic stress disorder, chronic: Secondary | ICD-10-CM | POA: Diagnosis not present

## 2021-02-03 DIAGNOSIS — F330 Major depressive disorder, recurrent, mild: Secondary | ICD-10-CM | POA: Diagnosis not present

## 2021-02-03 DIAGNOSIS — F900 Attention-deficit hyperactivity disorder, predominantly inattentive type: Secondary | ICD-10-CM | POA: Diagnosis not present

## 2021-02-04 DIAGNOSIS — H903 Sensorineural hearing loss, bilateral: Secondary | ICD-10-CM | POA: Diagnosis not present

## 2021-02-04 DIAGNOSIS — H9313 Tinnitus, bilateral: Secondary | ICD-10-CM | POA: Diagnosis not present

## 2021-02-18 ENCOUNTER — Other Ambulatory Visit: Payer: Self-pay | Admitting: Family Medicine

## 2021-02-18 DIAGNOSIS — R0602 Shortness of breath: Secondary | ICD-10-CM

## 2021-02-18 MED ORDER — MONTELUKAST SODIUM 10 MG PO TABS: ORAL_TABLET | ORAL | 0 refills | Status: DC

## 2021-02-18 NOTE — Telephone Encounter (Signed)
Medication Refill - Medication: montelukast (SINGULAIR) 10 MG tablet  Pt has been out for a while, and has been waiting on pharmacy request from the office for over a week.  Has the patient contacted their pharmacy? Yes.   (Agent: If no, request that the patient contact the pharmacy for the refill.) (Agent: If yes, when and what did the pharmacy advise?)  Preferred Pharmacy (with phone number or street name):  [id number] - [place], [place] - [address]  [address] [place] [address]  Phone: [phone] Fax: [phone]     Agent: Please be advised that RX refills may take up to 3 business days. We ask that you follow-up with your pharmacy.

## 2021-03-17 ENCOUNTER — Other Ambulatory Visit: Payer: Self-pay | Admitting: Family Medicine

## 2021-03-17 DIAGNOSIS — M7552 Bursitis of left shoulder: Secondary | ICD-10-CM

## 2021-03-17 DIAGNOSIS — R0602 Shortness of breath: Secondary | ICD-10-CM

## 2021-03-17 NOTE — Telephone Encounter (Signed)
  Notes to clinic: scripts have expired  Review for continued use and refills   Requested Prescriptions  Pending Prescriptions Disp Refills   naproxen (NAPROSYN) 500 MG tablet [Pharmacy Med Name: NAPROXEN 500 MG TABLET] 60 tablet 1    Sig: TAKE 1 TABLET (500 MG TOTAL) BY MOUTH 2 (TWO) TIMES DAILY WITH A MEAL. FOR 2-4 WEEKS THEN AS NEEDED      Analgesics:  NSAIDS Failed - 03/17/2021 11:44 AM      Failed - Cr in normal range and within 360 days    Creat  Date Value Ref Range Status  03/16/2020 0.69 0.50 - 1.10 mg/dL Final          Failed - HGB in normal range and within 360 days    Hemoglobin  Date Value Ref Range Status  03/16/2020 12.2 11.7 - 15.5 g/dL Final  14/48/1856 31.4 (L) 11.1 - 15.9 g/dL Final  97/12/6376 58.8 g/dL Final          Passed - Patient is not pregnant      Passed - Valid encounter within last 12 months    Recent Outpatient Visits           5 months ago Screening examination for STD (sexually transmitted disease)   [facility] Smitty Cords, DO   6 months ago Bilateral carpal tunnel syndrome   [facility], Jodelle Gross, FNP   1 year ago Rash and nonspecific skin eruption   [facility], Jodelle Gross, FNP   1 year ago Herpes simplex vulvovaginitis   [facility], Jodelle Gross, FNP   1 year ago Strep throat   [facility], Netta Neat, DO       Future Appointments             In 1 month Tahiliani, Dolphus Jenny, MD [facility] [place]               PROAIR HFA 108 [phone] Base) MCG/ACT inhaler [Pharmacy Med Name: PROAIR HFA 90 MCG INHALER] 8.5 each 2    Sig: INHALE 1 TO 2 PUFFS INTO THE LUNGS EVERY 6 (SIX) HOURS AS NEEDED FOR SHORTNESS OF BREATH.      Pulmonology:  Beta Agonists Failed - 03/17/2021 11:44 AM      Failed - One inhaler should last at least one month. If the patient is requesting refills earlier, contact the patient to check for  uncontrolled symptoms.      Passed - Valid encounter within last 12 months    Recent Outpatient Visits           5 months ago Screening examination for STD (sexually transmitted disease)   [facility] Smitty Cords, DO   6 months ago Bilateral carpal tunnel syndrome   [facility], Jodelle Gross, FNP   1 year ago Rash and nonspecific skin eruption   [facility], Jodelle Gross, FNP   1 year ago Herpes simplex vulvovaginitis   [facility], Jodelle Gross, FNP   1 year ago Strep throat   [facility] Smitty Cords, DO       Future Appointments             In 1 month Maximino Greenland, Dolphus Jenny, MD [facility] [place]

## 2021-03-24 DIAGNOSIS — F900 Attention-deficit hyperactivity disorder, predominantly inattentive type: Secondary | ICD-10-CM | POA: Diagnosis not present

## 2021-03-24 DIAGNOSIS — F330 Major depressive disorder, recurrent, mild: Secondary | ICD-10-CM | POA: Diagnosis not present

## 2021-03-24 DIAGNOSIS — F4312 Post-traumatic stress disorder, chronic: Secondary | ICD-10-CM | POA: Diagnosis not present

## 2021-04-02 DIAGNOSIS — F333 Major depressive disorder, recurrent, severe with psychotic symptoms: Secondary | ICD-10-CM | POA: Diagnosis not present

## 2021-04-16 ENCOUNTER — Other Ambulatory Visit: Payer: Self-pay

## 2021-04-16 DIAGNOSIS — F4310 Post-traumatic stress disorder, unspecified: Secondary | ICD-10-CM | POA: Diagnosis not present

## 2021-04-16 MED ORDER — TOPIRAMATE 25 MG PO TABS: ORAL_TABLET | ORAL | 0 refills | Status: DC

## 2021-04-16 NOTE — Progress Notes (Signed)
Per Dr.jaffe, We can start topiramate 25mg  at bedtime for one week, then increase to 50mg  at bedtime. Side effects may include numbness and tingling - if you   experiences this, it typically resolves once your  body gets used to the medication.   Script sen to the pharmacy.

## 2021-04-21 ENCOUNTER — Other Ambulatory Visit: Payer: Self-pay | Admitting: Certified Nurse Midwife

## 2021-04-26 ENCOUNTER — Ambulatory Visit: Payer: Medicaid Other | Admitting: Gastroenterology

## 2021-04-28 ENCOUNTER — Ambulatory Visit
Admission: EM | Admit: 2021-04-28 | Discharge: 2021-04-28 | Disposition: A | Discharge status: Home / Self Care | Payer: Medicaid Other | Attending: Emergency Medicine | Admitting: Emergency Medicine

## 2021-04-28 ENCOUNTER — Other Ambulatory Visit: Payer: Self-pay

## 2021-04-28 ENCOUNTER — Ambulatory Visit: Payer: Medicaid Other | Admitting: Gastroenterology

## 2021-04-28 ENCOUNTER — Telehealth: Payer: Self-pay

## 2021-04-28 DIAGNOSIS — R5383 Other fatigue: Secondary | ICD-10-CM | POA: Diagnosis not present

## 2021-04-28 DIAGNOSIS — R6889 Other general symptoms and signs: Secondary | ICD-10-CM | POA: Insufficient documentation

## 2021-04-28 DIAGNOSIS — R197 Diarrhea, unspecified: Secondary | ICD-10-CM | POA: Insufficient documentation

## 2021-04-28 DIAGNOSIS — R110 Nausea: Secondary | ICD-10-CM | POA: Insufficient documentation

## 2021-04-28 LAB — RAPID INFLUENZA A&B ANTIGENS
Influenza A (ARMC): NEGATIVE
Influenza B (ARMC): NEGATIVE

## 2021-04-28 MED ORDER — ONDANSETRON 4 MG PO TBDP: 4.0000 mg | ORAL_TABLET | Freq: Three times a day (TID) | ORAL | 0 refills | Status: DC | PRN

## 2021-04-28 NOTE — ED Triage Notes (Addendum)
Sx started Sunday with headache, nausea, bodyaches, sneezing and congestion.

## 2021-04-28 NOTE — Telephone Encounter (Signed)
Copied from CRM [id number]. Topic: General - Other >> Apr 28, 2021  8:09 AM Jaquita Rector A wrote: Reason for CRM: Patient called in to inquire if she can be seen today to be tested for the flu have nausea, congestion, runny nose, body aches and pain, tiredness been going on since Sunday 04/25/21. Please call patient at   Ph# [phone] >> Apr 28, 2021 11:53 AM Randol Kern wrote: Pt called back and would like to be seen as soon as possible
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
## 2021-05-08 ENCOUNTER — Other Ambulatory Visit: Payer: Self-pay | Admitting: Neurology

## 2021-05-10 ENCOUNTER — Ambulatory Visit: Payer: Medicaid Other | Admitting: Internal Medicine

## 2021-05-10 ENCOUNTER — Encounter: Payer: Self-pay | Admitting: Internal Medicine

## 2021-05-10 ENCOUNTER — Other Ambulatory Visit: Payer: Self-pay

## 2021-05-10 VITALS — BP 105/58 | HR 110 | Temp 97.6°F | Resp 18 | Ht 62.0 in | Wt 114.2 lb

## 2021-05-10 DIAGNOSIS — G43709 Chronic migraine without aura, not intractable, without status migrainosus: Secondary | ICD-10-CM | POA: Diagnosis not present

## 2021-05-10 DIAGNOSIS — M62838 Other muscle spasm: Secondary | ICD-10-CM

## 2021-05-10 DIAGNOSIS — R5383 Other fatigue: Secondary | ICD-10-CM

## 2021-05-10 DIAGNOSIS — Z8249 Family history of ischemic heart disease and other diseases of the circulatory system: Secondary | ICD-10-CM

## 2021-05-10 DIAGNOSIS — R21 Rash and other nonspecific skin eruption: Secondary | ICD-10-CM

## 2021-05-10 DIAGNOSIS — R200 Anesthesia of skin: Secondary | ICD-10-CM | POA: Diagnosis not present

## 2021-05-10 DIAGNOSIS — H9120 Sudden idiopathic hearing loss, unspecified ear: Secondary | ICD-10-CM

## 2021-05-10 DIAGNOSIS — R413 Other amnesia: Secondary | ICD-10-CM

## 2021-05-10 DIAGNOSIS — F339 Major depressive disorder, recurrent, unspecified: Secondary | ICD-10-CM

## 2021-05-10 DIAGNOSIS — R42 Dizziness and giddiness: Secondary | ICD-10-CM

## 2021-05-10 DIAGNOSIS — A6004 Herpesviral vulvovaginitis: Secondary | ICD-10-CM

## 2021-05-10 DIAGNOSIS — F900 Attention-deficit hyperactivity disorder, predominantly inattentive type: Secondary | ICD-10-CM

## 2021-05-10 DIAGNOSIS — F4310 Post-traumatic stress disorder, unspecified: Secondary | ICD-10-CM
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
## 2021-05-11 ENCOUNTER — Encounter: Payer: Self-pay | Admitting: Internal Medicine

## 2021-05-11 ENCOUNTER — Ambulatory Visit: Payer: Medicaid Other | Admitting: Gastroenterology

## 2021-05-11 VITALS — BP 104/72 | HR 98 | Temp 98.5°F | Wt 114.8 lb

## 2021-05-11 DIAGNOSIS — R109 Unspecified abdominal pain: Secondary | ICD-10-CM

## 2021-05-11 NOTE — Assessment & Plan Note (Signed)
Managed on Xanax as needed Support offered 

## 2021-05-11 NOTE — Assessment & Plan Note (Signed)
Managed on Adderall Will monitor
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
## 2021-05-11 NOTE — Assessment & Plan Note (Signed)
No recent flare. Will monitor 

## 2021-05-11 NOTE — Assessment & Plan Note (Signed)
Managed on Xanax as needed Support offered

## 2021-05-11 NOTE — Assessment & Plan Note (Signed)
Worsening despite Aimovig and Bernita Raisin Will obtain MRI brain today Referral to neurology at [facility] for second opinion

## 2021-05-11 NOTE — Patient Instructions (Signed)
Migraine Headache A migraine headache is a very strong throbbing pain on one side or both sides of your head. This type of headache can also cause other symptoms. It can last from 4 hours to 3 days. Talk with your doctor about what things may bring on (trigger) this condition. What are the causes? The exact cause of this condition is not known. This condition may be triggered or caused by: Drinking alcohol. Smoking. Taking medicines, such as: Medicine used to treat chest pain (nitroglycerin). Birth control pills. Estrogen. Some blood pressure medicines. Eating or drinking certain products. Doing physical activity. Other things that may trigger a migraine headache include: Having a menstrual period. Pregnancy. Hunger. Stress. Not getting enough sleep or getting too much sleep. Weather changes. Tiredness (fatigue). What increases the risk? Being 25-55 years old. Being female. Having a family history of migraine headaches. Being Caucasian. Having depression or anxiety. Being very overweight. What are the signs or symptoms? A throbbing pain. This pain may: Happen in any area of the head, such as on one side or both sides. Make it hard to do daily activities. Get worse with physical activity. Get worse around bright lights or loud noises. Other symptoms may include: Feeling sick to your stomach (nauseous). Vomiting. Dizziness. Being sensitive to bright lights, loud noises, or smells. Before you get a migraine headache, you may get warning signs (an aura). An aura may include: Seeing flashing lights or having blind spots. Seeing bright spots, halos, or zigzag lines. Having tunnel vision or blurred vision. Having numbness or a tingling feeling. Having trouble talking. Having weak muscles. Some people have symptoms after a migraine headache (postdromal phase), such as: Tiredness. Trouble thinking (concentrating). How is this treated? Taking medicines that: Relieve  pain. Relieve the feeling of being sick to your stomach. Prevent migraine headaches. Treatment may also include: Having acupuncture. Avoiding foods that bring on migraine headaches. Learning ways to control your body functions (biofeedback). Therapy to help you know and deal with negative thoughts (cognitive behavioral therapy). Follow these instructions at home: Medicines Take over-the-counter and prescription medicines only as told by your doctor. Ask your doctor if the medicine prescribed to you: Requires you to avoid driving or using heavy machinery. Can cause trouble pooping (constipation). You may need to take these steps to prevent or treat trouble pooping: Drink enough fluid to keep your pee (urine) pale yellow. Take over-the-counter or prescription medicines. Eat foods that are high in fiber. These include beans, whole grains, and fresh fruits and vegetables. Limit foods that are high in fat and sugar. These include fried or sweet foods. Lifestyle Do not drink alcohol. Do not use any products that contain nicotine or tobacco, such as cigarettes, e-cigarettes, and chewing tobacco. If you need help quitting, ask your doctor. Get at least 8 hours of sleep every night. Limit and deal with stress. General instructions   Keep a journal to find out what may bring on your migraine headaches. For example, write down: What you eat and drink. How much sleep you get. Any change in what you eat or drink. Any change in your medicines. If you have a migraine headache: Avoid things that make your symptoms worse, such as bright lights. It may help to lie down in a dark, quiet room. Do not drive or use heavy machinery. Ask your doctor what activities are safe for you. Keep all follow-up visits as told by your doctor. This is important. Contact a doctor if: You get a migraine headache that   is different or worse than others you have had. You have more than 15 headache days in one  month. Get help right away if: Your migraine headache gets very bad. Your migraine headache lasts longer than 72 hours. You have a fever. You have a stiff neck. You have trouble seeing. Your muscles feel weak or like you cannot control them. You start to lose your balance a lot. You start to have trouble walking. You pass out (faint). You have a seizure. Summary A migraine headache is a very strong throbbing pain on one side or both sides of your head. These headaches can also cause other symptoms. This condition may be treated with medicines and changes to your lifestyle. Keep a journal to find out what may bring on your migraine headaches. Contact a doctor if you get a migraine headache that is different or worse than others you have had. Contact your doctor if you have more than 15 headache days in a month. This information is not intended to replace advice given to you by your health care provider. Make sure you discuss any questions you have with your health care provider. Document Revised: 03/01/2019 Document Reviewed: 12/20/2018 Elsevier Patient Education  2022 [employer]  

## 2021-05-12 ENCOUNTER — Other Ambulatory Visit: Payer: Self-pay

## 2021-05-12 ENCOUNTER — Other Ambulatory Visit: Payer: Self-pay | Admitting: Certified Nurse Midwife

## 2021-05-12 DIAGNOSIS — R0602 Shortness of breath: Secondary | ICD-10-CM

## 2021-05-12 MED ORDER — ALYACEN 1/35 1-35 MG-MCG PO TABS
1.0000 | ORAL_TABLET | Freq: Every day | ORAL | 0 refills | Status: DC
Start: 2021-05-12 — End: 2021-06-03

## 2021-05-12 MED ORDER — MONTELUKAST SODIUM 10 MG PO TABS: ORAL_TABLET | ORAL | 0 refills | Status: DC

## 2021-05-13 LAB — CYCLIC CITRUL PEPTIDE ANTIBODY, IGG: Cyclic Citrullin Peptide Ab: <16 UNITS

## 2021-05-13 LAB — HEMOGLOBIN A1C
Hgb A1c MFr Bld: 4.4 % of total Hgb (ref <5.7)
Mean Plasma Glucose: 80 mg/dL
eAG (mmol/L): 4.4 mmol/L

## 2021-05-13 LAB — ANA: Anti Nuclear Antibody (ANA): POSITIVE — AB

## 2021-05-13 LAB — ANTI-NUCLEAR AB-TITER (ANA TITER): ANA Titer 1: 1:80 {titer} — ABNORMAL HIGH

## 2021-05-13 LAB — C-REACTIVE PROTEIN: CRP: 4.2 mg/L (ref <8.0)

## 2021-05-13 LAB — TSH: TSH: 0.63 mIU/L

## 2021-05-13 LAB — SEDIMENTATION RATE: Sed Rate: 6 mm/h (ref 0–20)

## 2021-05-26 ENCOUNTER — Other Ambulatory Visit: Payer: Self-pay

## 2021-05-26 ENCOUNTER — Ambulatory Visit
Admission: RE | Admit: 2021-05-26 | Discharge: 2021-05-26 | Disposition: A | Discharge status: Home / Self Care | Payer: Medicaid Other | Source: Ambulatory Visit | Attending: Internal Medicine | Admitting: Internal Medicine

## 2021-05-26 DIAGNOSIS — R200 Anesthesia of skin: Secondary | ICD-10-CM | POA: Insufficient documentation

## 2021-05-26 DIAGNOSIS — G43909 Migraine, unspecified, not intractable, without status migrainosus: Secondary | ICD-10-CM | POA: Diagnosis not present

## 2021-05-26 DIAGNOSIS — R519 Headache, unspecified: Secondary | ICD-10-CM | POA: Diagnosis not present

## 2021-05-26 DIAGNOSIS — G43709 Chronic migraine without aura, not intractable, without status migrainosus: Secondary | ICD-10-CM | POA: Diagnosis not present

## 2021-05-26 DIAGNOSIS — R413 Other amnesia: Secondary | ICD-10-CM

## 2021-05-26 IMAGING — MR MR HEAD WO/W CM
14 series · 48 of 48 positions shown · IV contrast (gadavist)
Comparison: Brain MRI [DATE].

CLINICAL DATA: Facial numbness. Memory impairment. Chronic migraine
without JIM without status migrainosus, not intractable. Headache,
chronic, new features or increased frequency. Additional history
provided by scanning technologist: Patient reports migraines,
dizziness, facial numbness, paresthesia of scalp, sudden onset
hearing loss, memory impairment, muscle cramps and spasms, family
history of brain aneurysm.

EXAM:
MRI HEAD WITHOUT AND WITH CONTRAST
TECHNIQUE: Multiplanar, multiecho pulse sequences of the brain and surrounding
structures were obtained without and with intravenous contrast.
CONTRAST:  5mL GADAVIST GADOBUTROL 1 MMOL/ML IV SOLN

[Series 5: ax dwi_tracew · axial · 3.0mm · 0.65mm/px · z∈[-157,-6]mm · 3 of 48 slices shown]
[im 1/48]
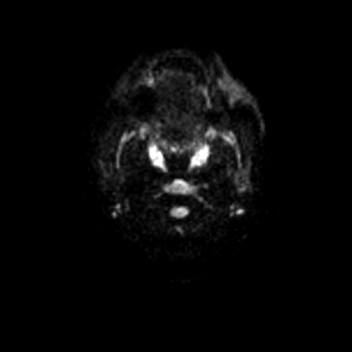
[im 24/48]
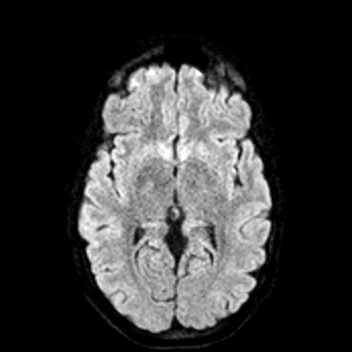
[im 48/48]
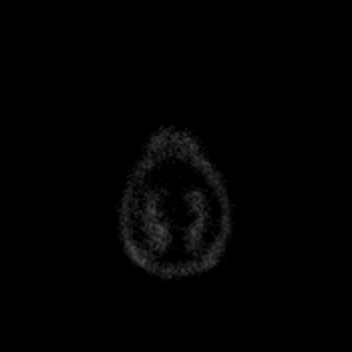

[Series 6: ax dwi_adc · axial · 3.0mm · 0.65mm/px · z∈[-157,-6]mm · 3 of 48 slices shown]
[im 1/48]
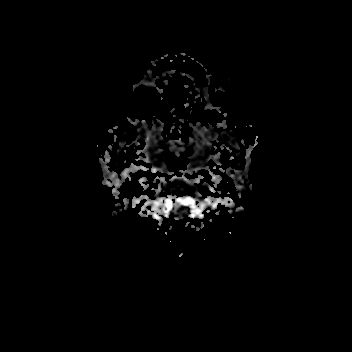
[im 24/48]
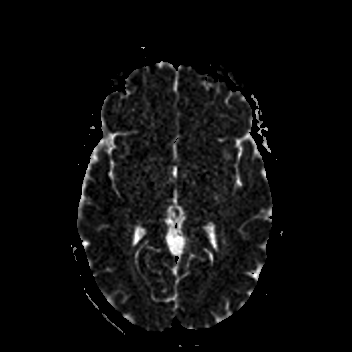
[im 48/48]
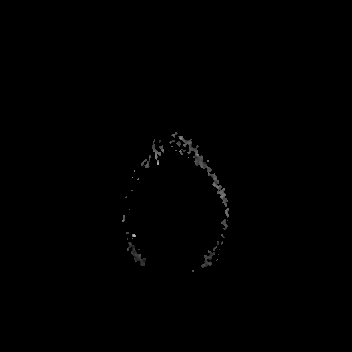

[Series 7: cor dwi_tracew · coronal · 5.0mm · 0.65mm/px · 2 of 40 slices shown]
[im 1/40]
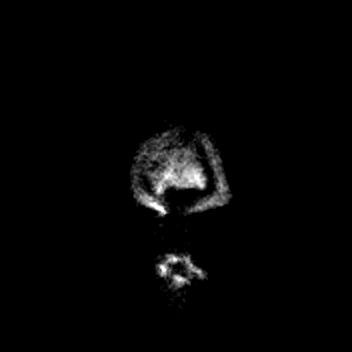
[im 40/40]
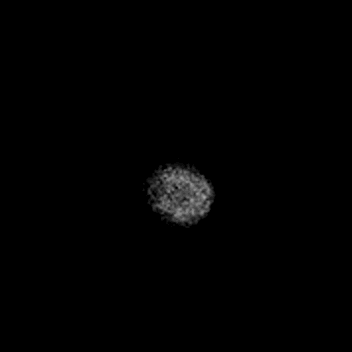

[Series 8: cor dwi_adc · coronal · 5.0mm · 0.65mm/px · 2 of 39 slices shown]
[im 1/39]
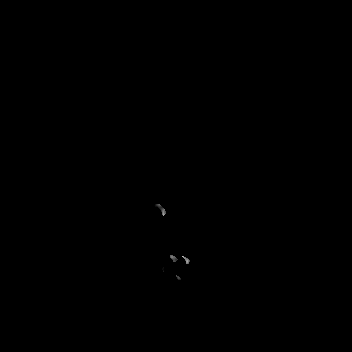
[im 39/39]
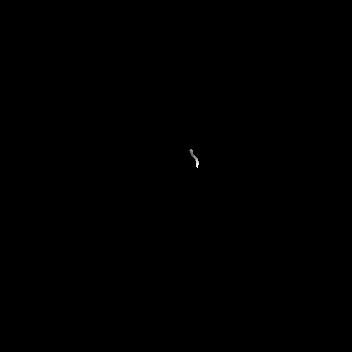

[Series 9: T1 · sagittal · 5.0mm · 0.62mm/px · 1 of 25 slices shown (1 of 2)]
[im 1/25]
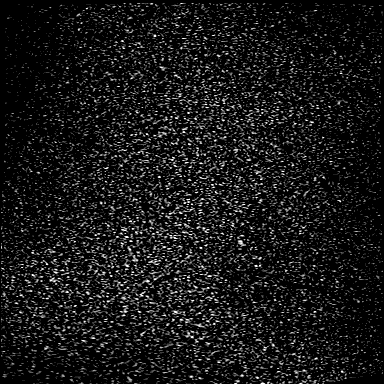

[Series 10: T2 · axial · 5.0mm · 0.53mm/px · 1 of 25 slices shown]
[im 1/25]
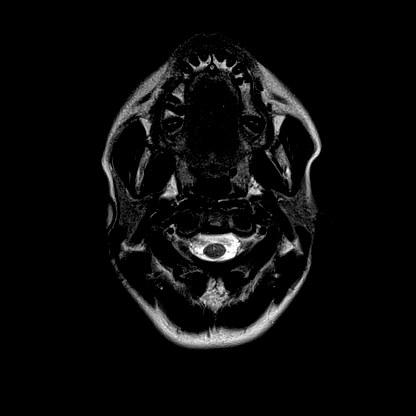

[Series 11: mag_images · axial · 3.0mm · 0.90mm/px · z∈[-167,+6]mm · 3 of 60 slices shown]
[im 1/60]
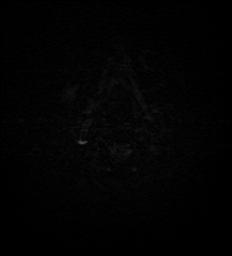
[im 30/60]
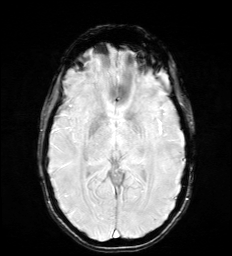
[im 60/60]
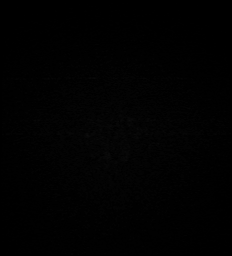

[Series 12: pha_images · axial · 3.0mm · 0.90mm/px · z∈[-167,+6]mm · 3 of 59 slices shown]
[im 1/59]
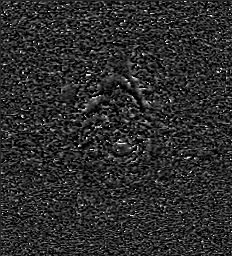
[im 30/59]
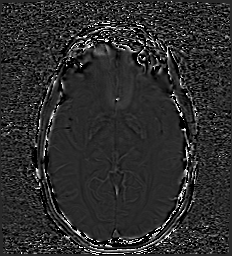
[im 59/59]
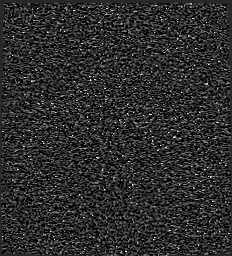

[Series 13: swi_images · axial · 3.0mm · 0.90mm/px · z∈[-167,+6]mm · 3 of 60 slices shown]
[im 1/60]
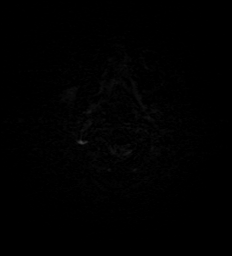
[im 30/60]
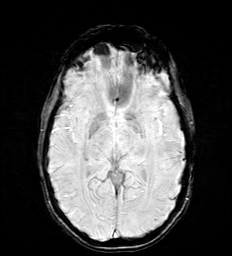
[im 60/60]
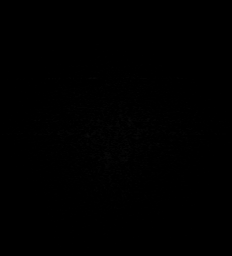

[Series 15: FLAIR · axial · 3.0mm · 0.53mm/px · z∈[-159,-1]mm · 3 of 55 slices shown]
[im 1/55]
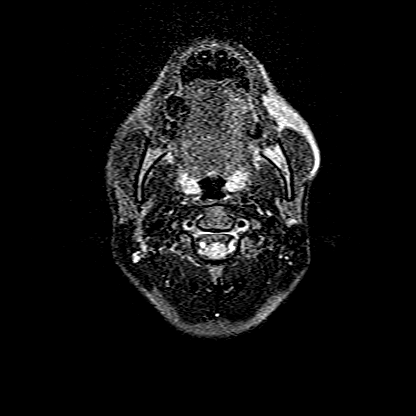
[im 28/55]
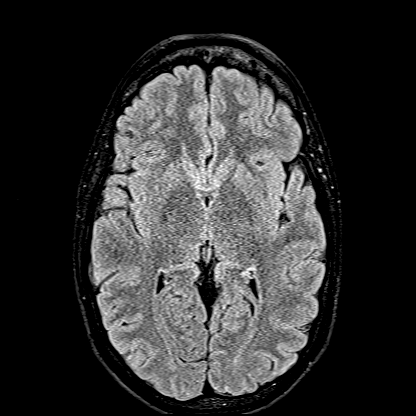
[im 55/55]
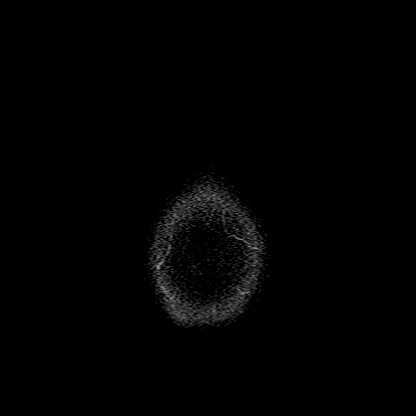

[Series 16: T1 · axial · 1.0mm · 0.98mm/px · z∈[-169,+2]mm · 10 of 176 slices shown (2 of 2)]
[im 1/176]
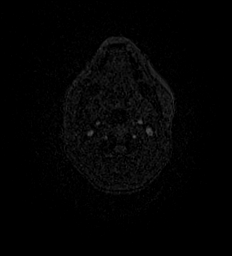
[im 20/176]
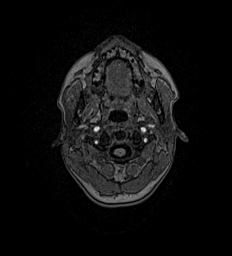
[im 39/176]
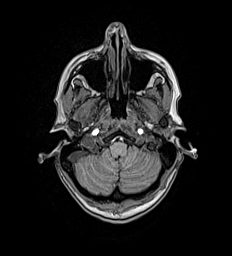
[im 59/176]
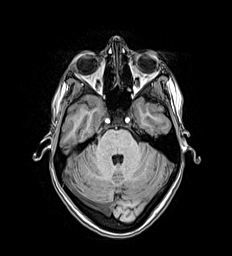
[im 78/176]
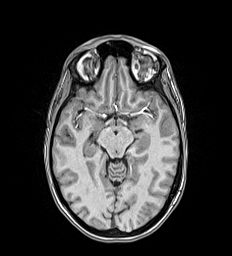
[im 98/176]
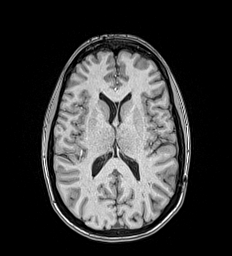
[im 117/176]
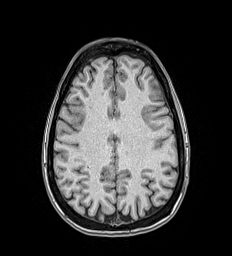
[im 137/176]
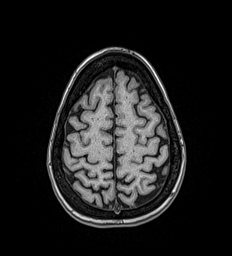
[im 156/176]
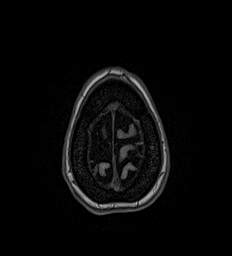
[im 176/176]
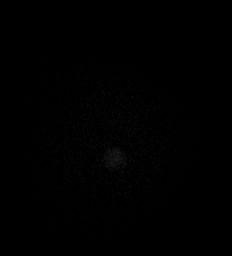

[Series 17: T2 post-contrast · coronal · 5.0mm · 0.57mm/px · 2 of 29 slices shown]
[im 1/29]
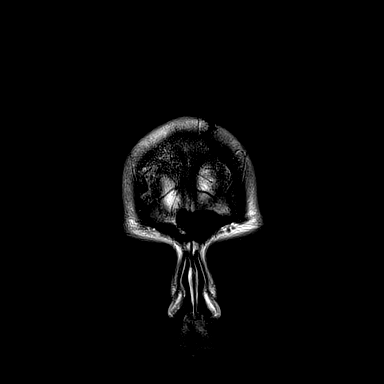
[im 29/29]
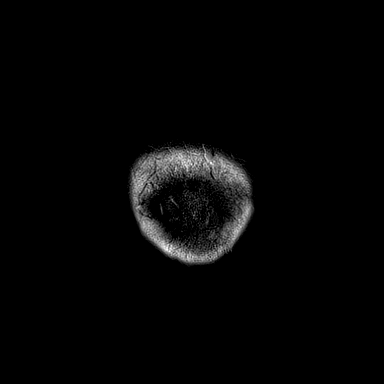

[Series 18: T1 post-contrast · axial · 1.0mm · 0.98mm/px · z∈[-169,+2]mm · 10 of 176 slices shown (1 of 2)]
[im 1/176]
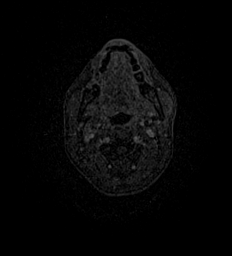
[im 20/176]
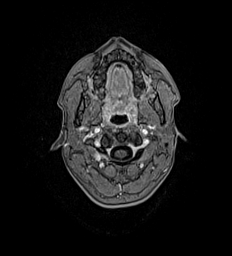
[im 39/176]
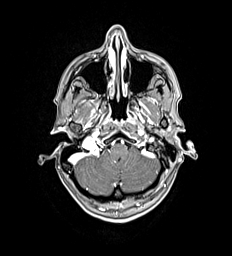
[im 59/176]
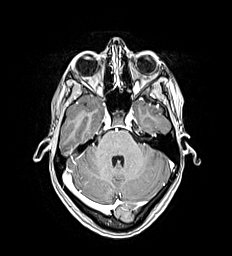
[im 78/176]
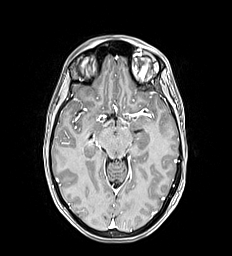
[im 98/176]
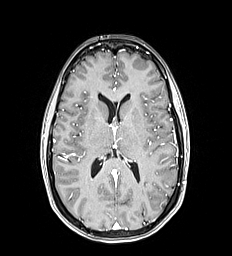
[im 117/176]
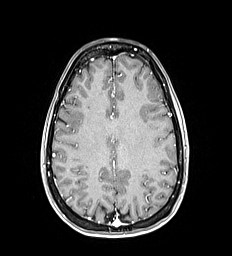
[im 137/176]
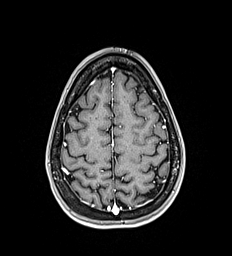
[im 156/176]
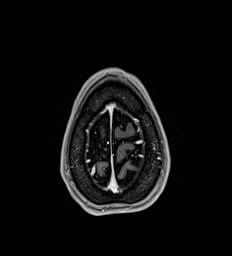
[im 176/176]
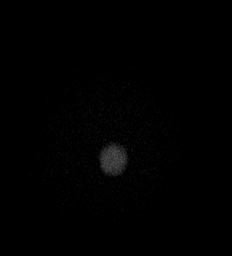

[Series 19: T1 post-contrast · coronal · 5.0mm · 0.57mm/px · 2 of 29 slices shown (2 of 2)]
[im 1/29]
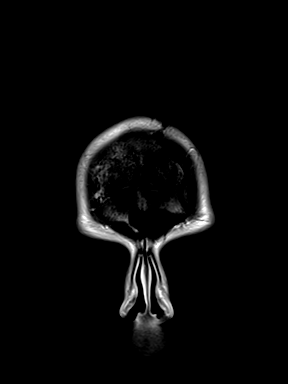
[im 29/29]
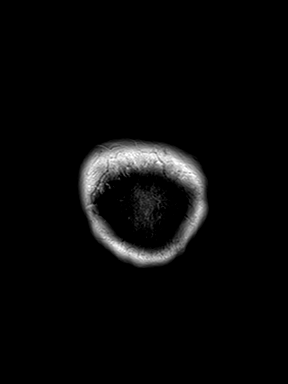

[48 of 48 positions shown; findings below may reference images not displayed]

FINDINGS: Brain:

Cerebral volume is normal.

There are a few small (measuring up to 4 mm) T2 hyperintense signal
abnormality within the left greater than right frontal lobe white
matter, nonspecific and unchanged from the brain MRI of [DATE].

No other focal parenchymal signal abnormality is identified.

There is no acute infarct.

No evidence of an intracranial mass.

No chronic intracranial blood products.

No extra-axial fluid collection.

No midline shift.

Unchanged, the cerebellar tonsils are mildly low-lying, but extend
less than 5 mm below level of foramen magnum (within normal limits).

No abnormal intracranial enhancement.

Vascular: Expected proximal arterial flow voids. Small right frontal
lobe developmental venous anomaly (an anatomic variant).

Skull and upper cervical spine: No focal marrow lesion. Trace C2-C3
grade 1 anterolisthesis, unchanged.

Sinuses/Orbits: Visualized orbits show no acute finding. Tiny mucous
retention cysts within the posterior ethmoid air cells bilaterally.
IMPRESSION: No evidence of acute intracranial abnormality.

There are a few small (measuring up to 4 mm) scattered T2
hyperintense foci within the left greater than right frontal lobe
white matter, compatible with nonspecific remote insults.

## 2021-05-26 MED ORDER — GADOBUTROL 1 MMOL/ML IV SOLN
5.0000 mL | Freq: Once | INTRAVENOUS | Status: AC | PRN
  Administered 2021-05-26: 5 mL via INTRAVENOUS

## 2021-05-27 ENCOUNTER — Encounter: Payer: Self-pay | Admitting: Gastroenterology

## 2021-05-27 ENCOUNTER — Encounter: Payer: Self-pay | Admitting: Internal Medicine

## 2021-05-28 ENCOUNTER — Ambulatory Visit
Admission: RE | Admit: 2021-05-28 | Discharge: 2021-05-28 | Disposition: A | Discharge status: Home / Self Care | Payer: Medicaid Other | Attending: Gastroenterology | Admitting: Gastroenterology

## 2021-05-28 ENCOUNTER — Ambulatory Visit: Payer: Medicaid Other | Admitting: Certified Registered"

## 2021-05-28 ENCOUNTER — Encounter
Admission: RE | Disposition: A | Discharge status: Home / Self Care | Payer: Self-pay | Source: Home / Self Care | Attending: Gastroenterology

## 2021-05-28 ENCOUNTER — Other Ambulatory Visit: Payer: Self-pay

## 2021-05-28 ENCOUNTER — Encounter: Payer: Self-pay | Admitting: Gastroenterology

## 2021-05-28 DIAGNOSIS — K3189 Other diseases of stomach and duodenum: Secondary | ICD-10-CM | POA: Diagnosis not present

## 2021-05-28 DIAGNOSIS — Z793 Long term (current) use of hormonal contraceptives: Secondary | ICD-10-CM | POA: Diagnosis not present

## 2021-05-28 DIAGNOSIS — Z79899 Other long term (current) drug therapy: Secondary | ICD-10-CM | POA: Insufficient documentation

## 2021-05-28 DIAGNOSIS — Z7951 Long term (current) use of inhaled steroids: Secondary | ICD-10-CM | POA: Diagnosis not present

## 2021-05-28 DIAGNOSIS — Z880 Allergy status to penicillin: Secondary | ICD-10-CM | POA: Diagnosis not present

## 2021-05-28 DIAGNOSIS — R109 Unspecified abdominal pain: Secondary | ICD-10-CM | POA: Diagnosis not present

## 2021-05-28 DIAGNOSIS — K2950 Unspecified chronic gastritis without bleeding: Secondary | ICD-10-CM | POA: Insufficient documentation

## 2021-05-28 DIAGNOSIS — Z91040 Latex allergy status: Secondary | ICD-10-CM | POA: Diagnosis not present

## 2021-05-28 DIAGNOSIS — Z888 Allergy status to other drugs, medicaments and biological substances status: Secondary | ICD-10-CM | POA: Diagnosis not present

## 2021-05-28 DIAGNOSIS — F329 Major depressive disorder, single episode, unspecified: Secondary | ICD-10-CM | POA: Diagnosis not present

## 2021-05-28 HISTORY — PX: ESOPHAGOGASTRODUODENOSCOPY (EGD) WITH PROPOFOL: SHX5813

## 2021-05-28 LAB — POCT PREGNANCY, URINE: Preg Test, Ur: NEGATIVE

## 2021-05-28 SURGERY — ESOPHAGOGASTRODUODENOSCOPY (EGD) WITH PROPOFOL
Anesthesia: General

## 2021-05-28 MED ORDER — SODIUM CHLORIDE 0.9 % IV SOLN: INTRAVENOUS | Status: DC

## 2021-05-28 MED ORDER — GLYCOPYRROLATE 0.2 MG/ML IJ SOLN
INTRAMUSCULAR | Status: AC
  Filled 2021-05-28: qty 1

## 2021-05-28 MED ORDER — GLYCOPYRROLATE 0.2 MG/ML IJ SOLN
INTRAMUSCULAR | Status: DC | PRN
  Administered 2021-05-28: .1 mg via INTRAVENOUS

## 2021-05-28 MED ORDER — PROPOFOL 10 MG/ML IV BOLUS
INTRAVENOUS | Status: AC
  Filled 2021-05-28: qty 20

## 2021-05-28 MED ORDER — DEXMEDETOMIDINE (PRECEDEX) IN NS 20 MCG/5ML (4 MCG/ML) IV SYRINGE
PREFILLED_SYRINGE | INTRAVENOUS | Status: AC
  Filled 2021-05-28: qty 5

## 2021-05-28 MED ORDER — MIDAZOLAM HCL 2 MG/2ML IJ SOLN
INTRAMUSCULAR | Status: AC
  Filled 2021-05-28: qty 2

## 2021-05-28 MED ORDER — MIDAZOLAM HCL 2 MG/2ML IJ SOLN
INTRAMUSCULAR | Status: DC | PRN
  Administered 2021-05-28: 2 mg via INTRAVENOUS

## 2021-05-28 MED ORDER — DEXMEDETOMIDINE (PRECEDEX) IN NS 20 MCG/5ML (4 MCG/ML) IV SYRINGE
PREFILLED_SYRINGE | INTRAVENOUS | Status: DC | PRN
  Administered 2021-05-28: 8 ug via INTRAVENOUS
  Administered 2021-05-28: 4 ug via INTRAVENOUS

## 2021-05-28 MED ORDER — PROPOFOL 10 MG/ML IV BOLUS
INTRAVENOUS | Status: DC | PRN
  Administered 2021-05-28: 70 mg via INTRAVENOUS
  Administered 2021-05-28: 30 mg via INTRAVENOUS
  Administered 2021-05-28: 20 mg via INTRAVENOUS
  Administered 2021-05-28: 30 mg via INTRAVENOUS

## 2021-05-28 NOTE — Transfer of Care (Signed)
Immediate Anesthesia Transfer of Care Note  Patient: [name]  Procedure(s) Performed: ESOPHAGOGASTRODUODENOSCOPY (EGD) WITH PROPOFOL  Patient Location: PACU and Endoscopy Unit  Anesthesia Type:General  Level of Consciousness: awake and patient cooperative  Airway & Oxygen Therapy: Patient Spontanous Breathing  Post-op Assessment: Report given to RN and Post -op Vital signs reviewed and stable  Post vital signs: Reviewed and stable  Last Vitals:  Vitals Value Taken Time  BP 90/53 05/28/21 1109  Temp    Pulse 102 05/28/21 1109  Resp 18 05/28/21 1109  SpO2 100 % 05/28/21 1109  Vitals shown include unvalidated device data.  Last Pain:  Vitals:   05/28/21 0958  TempSrc: Temporal  PainSc: 0-No pain         Complications: No notable events documented.

## 2021-05-28 NOTE — Anesthesia Postprocedure Evaluation (Signed)
Anesthesia Post Note  Patient: [name]  Procedure(s) Performed: ESOPHAGOGASTRODUODENOSCOPY (EGD) WITH PROPOFOL  Patient location during evaluation: Endoscopy Anesthesia Type: General Level of consciousness: awake and alert Pain management: pain level controlled Vital Signs Assessment: post-procedure vital signs reviewed and stable Respiratory status: spontaneous breathing, nonlabored ventilation, respiratory function stable and patient connected to nasal cannula oxygen Cardiovascular status: blood pressure returned to baseline and stable Postop Assessment: no apparent nausea or vomiting Anesthetic complications: no   No notable events documented.   Last Vitals:  Vitals:   05/28/21 1130 05/28/21 1140  BP: (!) 87/58 96/62  Pulse: 84 94  Resp: 15 13  Temp:    SpO2: 100% 100%    Last Pain:  Vitals:   05/28/21 1140  TempSrc:   PainSc: 0-No pain                 Cleda Mccreedy Jaiona Simien
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
## 2021-05-28 NOTE — Anesthesia Procedure Notes (Signed)
Procedure Name: MAC Date/Time: 05/28/2021 10:46 AM Performed by: Jerrye Noble, CRNA Pre-anesthesia Checklist: Patient identified, Emergency Drugs available, Suction available and Patient being monitored Patient Re-evaluated:Patient Re-evaluated prior to induction Oxygen Delivery Method: Nasal cannula

## 2021-05-28 NOTE — Op Note (Signed)
[facility] Gastroenterology Patient Name: [name] Procedure Date: 05/28/2021 10:43 AM MRN: [id number] Account #: [id number] Date of Birth: 01-03-1986 Admit Type: Outpatient Age: 35 Room: [facility] ENDO ROOM 2 Gender: Female Note Status: Finalized Procedure:             Upper GI endoscopy Indications:           Abdominal pain Providers:             Meilah Delrosario B. Maximino Greenland MD, MD Referring MD:          Lorre Munroe (Referring MD) Medicines:             Monitored Anesthesia Care Complications:         No immediate complications. Procedure:             Pre-Anesthesia Assessment:                        - Prior to the procedure, a History and Physical was                         performed, and patient medications, allergies and                         sensitivities were reviewed. The patient's tolerance                         of previous anesthesia was reviewed.                        - The risks and benefits of the procedure and the                         sedation options and risks were discussed with the                         patient. All questions were answered and informed                         consent was obtained.                        - Patient identification and proposed procedure were                         verified prior to the procedure by the physician, the                         nurse, the anesthesiologist, the anesthetist and the                         technician. The procedure was verified in the                         procedure room.                        - ASA Grade Assessment: II - A patient with mild                         systemic disease.  After obtaining informed consent, the endoscope was                         passed under direct vision. Throughout the procedure,                         the patient's blood pressure, pulse, and oxygen                         saturations were monitored continuously. The  Endoscope                         was introduced through the mouth, and advanced to the                         second part of duodenum. The upper GI endoscopy was                         accomplished with ease. The patient tolerated the                         procedure well. Findings:      The examined esophagus was normal.      A small sliding hiatal hernia may be present, but was only seen on       retroflexed view and not on frontal view.      The exam of the stomach was otherwise normal.      The entire examined stomach was normal. Biopsies were obtained in the       gastric body, at the incisura and in the gastric antrum with cold       forceps for histology. Biopsies were taken with a cold forceps for       Helicobacter pylori testing.      Patchy mild mucosal changes characterized by discoloration were found in       the second portion of the duodenum. Biopsies were taken with a cold       forceps for histology. Biopsies were done around the 5 to 6 o clock       positions to avoid biopsies near the area the ampulla, which is usually       seen around the 11 to 12 o clock positions. The ampulla was not seen on       this exam.      The exam of the duodenum was otherwise normal. Impression:            - Normal esophagus.                        - A small sliding hiatal hernia may be present, but                         was only seen on retroflexed view and not on frontal                         view.                        - Normal stomach. Biopsied.                        -  Mucosal changes in the duodenum. Biopsied.                        - Biopsies were obtained in the gastric body, at the                         incisura and in the gastric antrum. Recommendation:        - Await pathology results.                        - If symptoms continue consider Upper GI study to                         evaluate for presence of hiatal hernia.                        - Discharge patient to  home (with escort).                        - Advance diet as tolerated.                        - Continue present medications.                        - Patient has a contact number available for                         emergencies. The signs and symptoms of potential                         delayed complications were discussed with the patient.                         Return to normal activities tomorrow. Written                         discharge instructions were provided to the patient.                        - Discharge patient to home (with escort).                        - The findings and recommendations were discussed with                         the patient.                        - The findings and recommendations were discussed with                         the patient's family. Procedure Code(s):     --- Professional ---                        [phone], Esophagogastroduodenoscopy, flexible,                         transoral; with biopsy, single or multiple Diagnosis Code(s):     ---  Professional ---                        K31.89, Other diseases of stomach and duodenum                        R10.9, Unspecified abdominal pain CPT copyright 2019 American Medical Association. All rights reserved. The codes documented in this report are preliminary and upon coder review may  be revised to meet current compliance requirements.  Melodie BouillonVarnita Dat Derksen, MD Michel BickersVarnita B. Maximino Greenlandahiliani MD, MD 05/28/2021 11:13:34 AM This report has been signed electronically. Number of Addenda: 0 Note Initiated On: 05/28/2021 10:43 AM Estimated Blood Loss:  Estimated blood loss: none.      [facility]
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
## 2021-05-31 ENCOUNTER — Encounter: Payer: Self-pay | Admitting: Gastroenterology

## 2021-05-31 DIAGNOSIS — R42 Dizziness and giddiness: Secondary | ICD-10-CM | POA: Diagnosis not present

## 2021-05-31 DIAGNOSIS — H905 Unspecified sensorineural hearing loss: Secondary | ICD-10-CM | POA: Diagnosis not present

## 2021-05-31 DIAGNOSIS — G43019 Migraine without aura, intractable, without status migrainosus: Secondary | ICD-10-CM | POA: Diagnosis not present

## 2021-05-31 LAB — SURGICAL PATHOLOGY

## 2021-06-03 ENCOUNTER — Other Ambulatory Visit: Payer: Self-pay

## 2021-06-03 ENCOUNTER — Telehealth: Payer: Self-pay | Admitting: Certified Nurse Midwife

## 2021-06-03 MED ORDER — ALYACEN 1/35 1-35 MG-MCG PO TABS: 1.0000 | ORAL_TABLET | Freq: Every day | ORAL | 0 refills | Status: DC

## 2021-06-03 NOTE — Telephone Encounter (Signed)
Patient wanted to reschedule her annual exam due to being on her cycle.  She will need to start her birth control that following Sunday and her appt is on Wednesday.  She is asking if she can get 1 month of her birth control sent in..Please advise

## 2021-06-03 NOTE — Telephone Encounter (Signed)
Sent in refill for 1 mo BC until pt can get in to be seen due to pt reschedule. Pt aware and agrees to f/u with reschedule.

## 2021-06-06 ENCOUNTER — Other Ambulatory Visit: Payer: Self-pay | Admitting: Certified Nurse Midwife

## 2021-06-08 ENCOUNTER — Other Ambulatory Visit: Payer: Self-pay

## 2021-06-09 ENCOUNTER — Encounter: Payer: Medicaid Other | Admitting: Certified Nurse Midwife

## 2021-06-10 ENCOUNTER — Other Ambulatory Visit: Payer: Self-pay | Admitting: Gastroenterology

## 2021-06-10 DIAGNOSIS — R109 Unspecified abdominal pain: Secondary | ICD-10-CM

## 2021-06-16 ENCOUNTER — Encounter: Payer: Self-pay | Admitting: Certified Nurse Midwife

## 2021-06-16 ENCOUNTER — Other Ambulatory Visit: Payer: Self-pay | Admitting: Family Medicine

## 2021-06-16 ENCOUNTER — Other Ambulatory Visit: Payer: Self-pay

## 2021-06-16 ENCOUNTER — Ambulatory Visit (INDEPENDENT_AMBULATORY_CARE_PROVIDER_SITE_OTHER): Payer: Medicaid Other | Admitting: Certified Nurse Midwife

## 2021-06-16 VITALS — BP 105/72 | HR 102 | Ht 62.0 in | Wt 112.9 lb

## 2021-06-16 DIAGNOSIS — Z01419 Encounter for gynecological examination (general) (routine) without abnormal findings: Secondary | ICD-10-CM | POA: Diagnosis not present

## 2021-06-16 DIAGNOSIS — R0602 Shortness of breath: Secondary | ICD-10-CM

## 2021-06-16 MED ORDER — NORETHINDRONE 0.35 MG PO TABS: 1.0000 | ORAL_TABLET | Freq: Every day | ORAL | 11 refills | Status: DC
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
## 2021-06-16 NOTE — Telephone Encounter (Signed)
Requested Prescriptions  Pending Prescriptions Disp Refills  . PROAIR HFA 108 (90 Base) MCG/ACT inhaler [Pharmacy Med Name: PROAIR HFA 90 MCG INHALER] 8.5 each 2    Sig: INHALE 1 TO 2 PUFFS INTO THE LUNGS EVERY 6 (SIX) HOURS AS NEEDED FOR SHORTNESS OF BREATH.     Pulmonology:  Beta Agonists Failed - 06/16/2021  8:08 PM      Failed - One inhaler should last at least one month. If the patient is requesting refills earlier, contact the patient to check for uncontrolled symptoms.      Passed - Valid encounter within last 12 months    Recent Outpatient Visits          1 month ago Dizziness   [facility] [place], Salvadore Oxford, NP   8 months ago Screening examination for STD (sexually transmitted disease)   [facility] Smitty Cords, DO   9 months ago Bilateral carpal tunnel syndrome   [facility], Jodelle Gross, FNP   1 year ago Rash and nonspecific skin eruption   [facility], Jodelle Gross, FNP   1 year ago Herpes simplex vulvovaginitis   [facility], Jodelle Gross, FNP      Future Appointments            In 1 month Maximino Greenland, Dolphus Jenny, MD [facility] [place]

## 2021-06-18 ENCOUNTER — Ambulatory Visit: Payer: Medicaid Other

## 2021-06-25 ENCOUNTER — Other Ambulatory Visit: Payer: Self-pay | Admitting: Gastroenterology

## 2021-06-25 ENCOUNTER — Other Ambulatory Visit: Payer: Self-pay

## 2021-06-25 MED ORDER — OMEPRAZOLE 40 MG PO CPDR: 40.0000 mg | DELAYED_RELEASE_CAPSULE | Freq: Every day | ORAL | 0 refills | Status: DC

## 2021-06-28 ENCOUNTER — Other Ambulatory Visit: Payer: Self-pay | Admitting: Gastroenterology

## 2021-06-28 MED ORDER — ESOMEPRAZOLE MAGNESIUM 40 MG PO CPDR: 40.0000 mg | DELAYED_RELEASE_CAPSULE | Freq: Every day | ORAL | 0 refills | Status: DC

## 2021-06-29 ENCOUNTER — Other Ambulatory Visit: Payer: Self-pay

## 2021-06-29 ENCOUNTER — Ambulatory Visit
Admission: RE | Admit: 2021-06-29 | Discharge: 2021-06-29 | Disposition: A | Discharge status: Home / Self Care | Payer: Medicaid Other | Source: Ambulatory Visit | Attending: Gastroenterology | Admitting: Gastroenterology

## 2021-06-29 DIAGNOSIS — R109 Unspecified abdominal pain: Secondary | ICD-10-CM

## 2021-06-29 IMAGING — RF DG UGI W SINGLE CM
14 of 15 series · 14 of 15 positions shown · non-contrast
Comparison: None.

CLINICAL DATA: Epigastric pain, decreased appetite

EXAM:
UPPER GI SERIES WITHOUT KUB
TECHNIQUE: Routine upper GI series was performed with thin density  barium.
FLUOROSCOPY TIME:  Fluoroscopy Time:  0.6 minute
Radiation Exposure Index (if provided by the fluoroscopic device):
1.2 mGy
Number of Acquired Spot Images: 0

[Series 1: cp_standard · 0.25mm/px · 1 of 1 slices shown (1 of 14)]
[im 1/1]
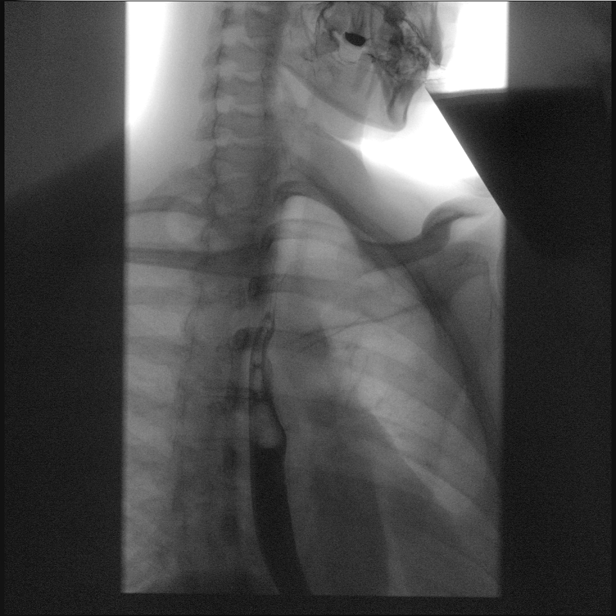

[Series 3: cp_standard · 0.25mm/px · 1 of 1 slices shown (2 of 14)]
[im 1/1]
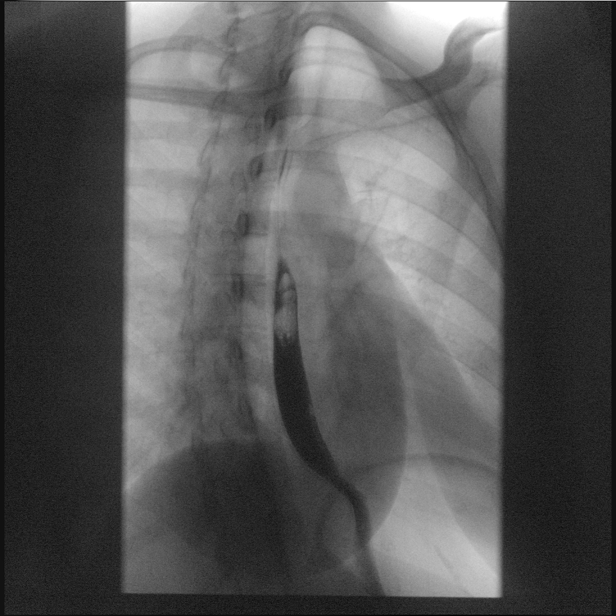

[Series 4: cp_standard · 0.25mm/px · 1 of 1 slices shown (3 of 14)]
[im 1/1]
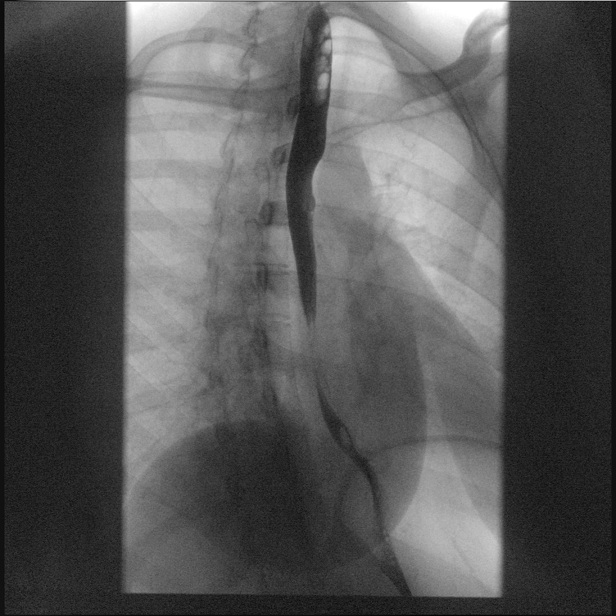

[Series 5: cp_standard · 0.25mm/px · 1 of 1 slices shown (4 of 14)]
[im 1/1]
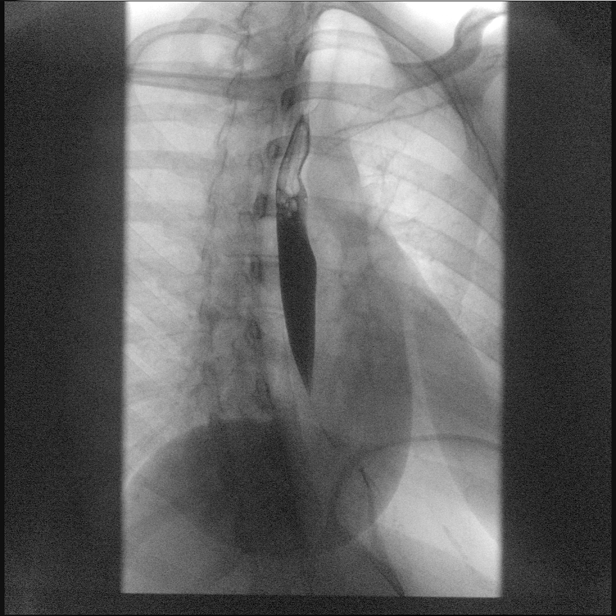

[Series 6: cp_standard · 0.25mm/px · 1 of 1 slices shown (5 of 14)]
[im 1/1]
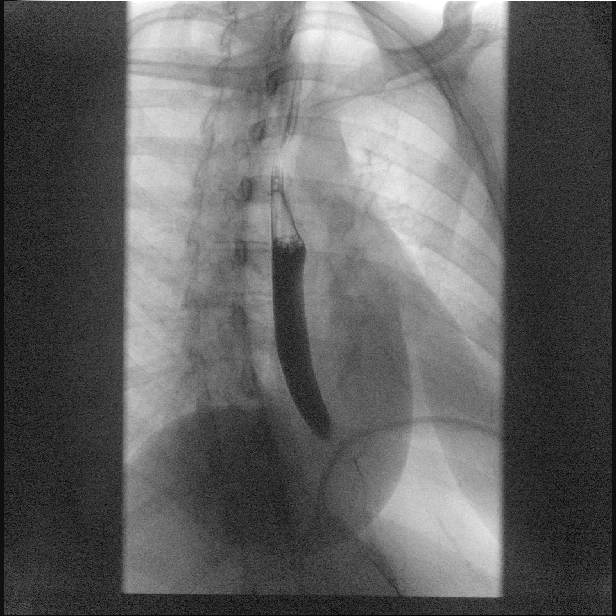

[Series 7: cp_standard · 0.25mm/px · 1 of 1 slices shown (6 of 14)]
[im 1/1]
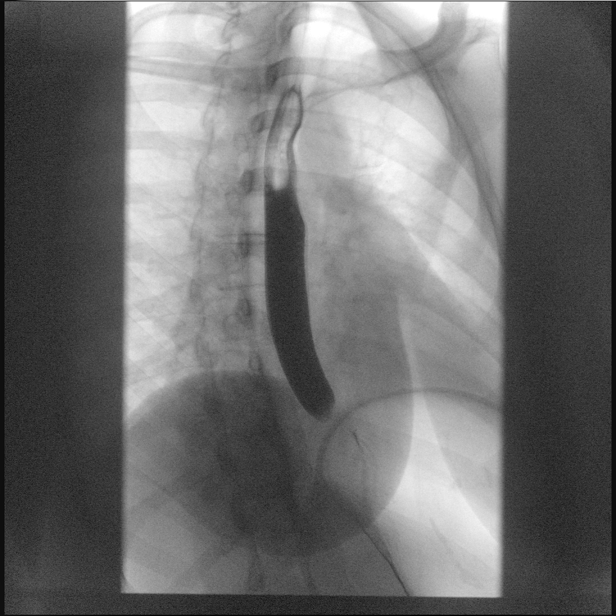

[Series 8: cp_standard · 0.25mm/px · 1 of 1 slices shown (7 of 14)]
[im 1/1]
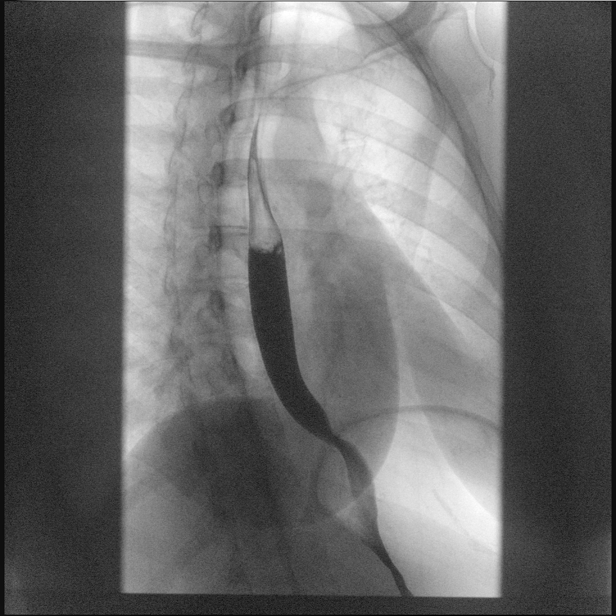

[Series 10: cp_standard · 0.26mm/px · 1 of 1 slices shown (8 of 14)]
[im 1/1]
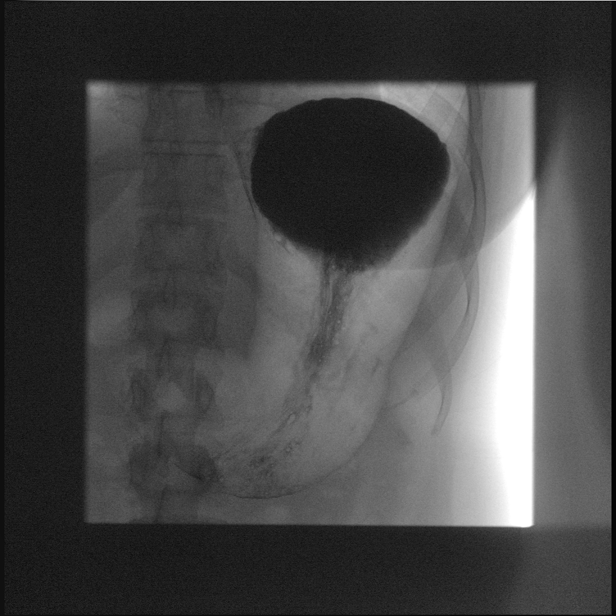

[Series 11: cp_standard · 0.26mm/px · 1 of 1 slices shown (9 of 14)]
[im 1/1]
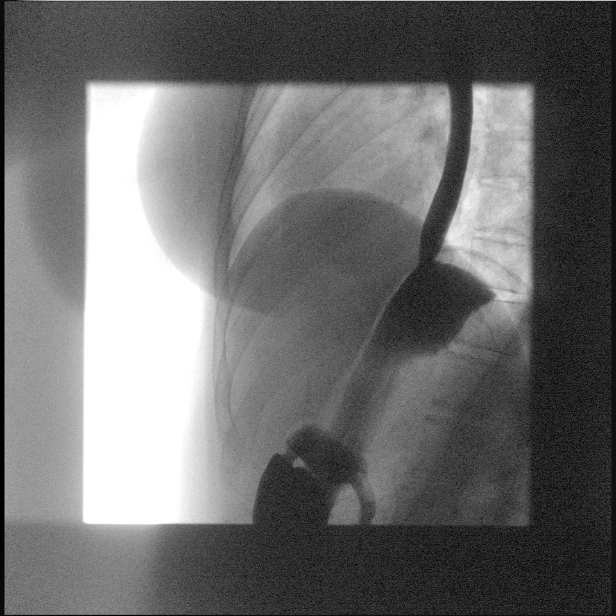

[Series 12: cp_standard · 0.26mm/px · 1 of 1 slices shown (10 of 14)]
[im 1/1]
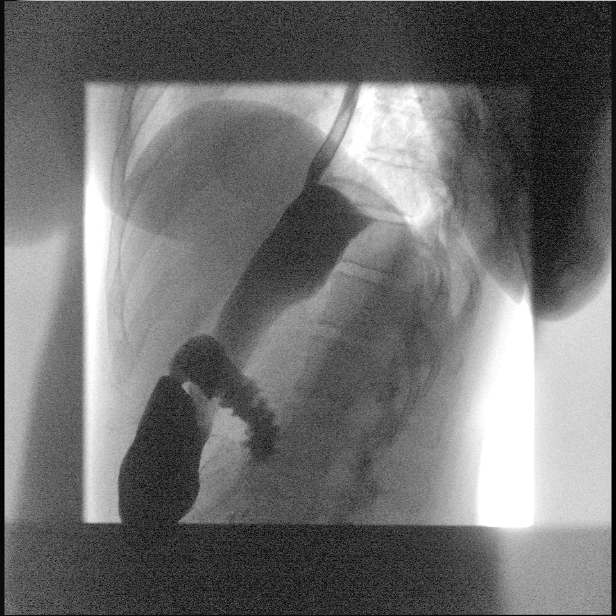

[Series 13: cp_standard · 0.26mm/px · 1 of 1 slices shown (11 of 14)]
[im 1/1]
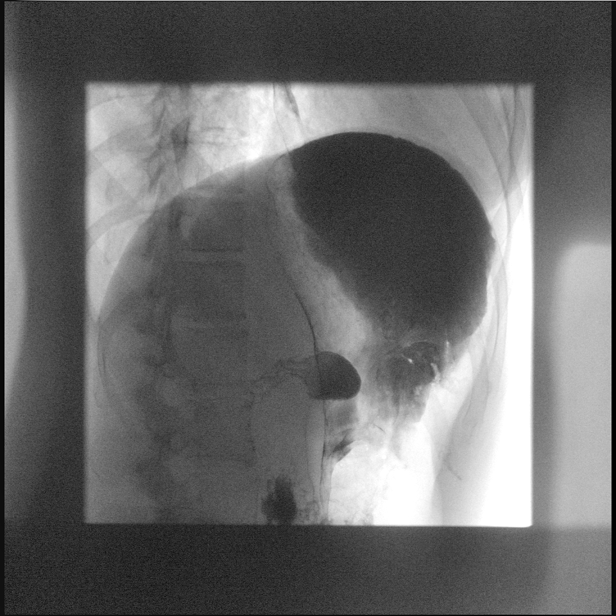

[Series 14: cp_standard · 0.26mm/px · 1 of 1 slices shown (12 of 14)]
[im 1/1]
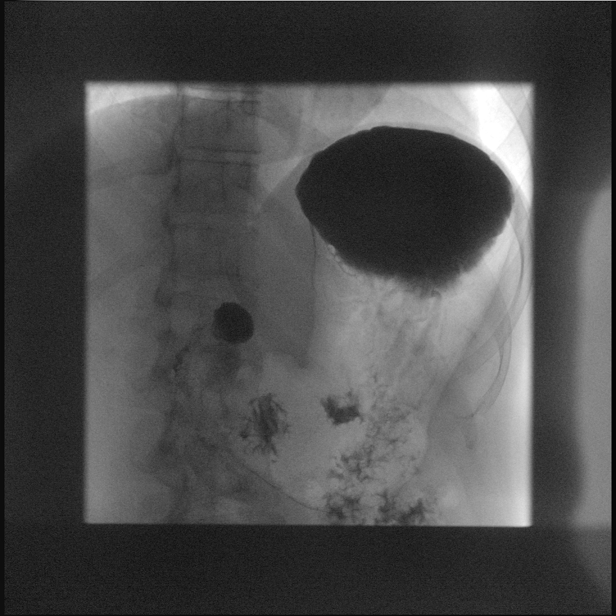

[Series 15: cp_standard · 0.26mm/px · 1 of 1 slices shown (13 of 14)]
[im 1/1]
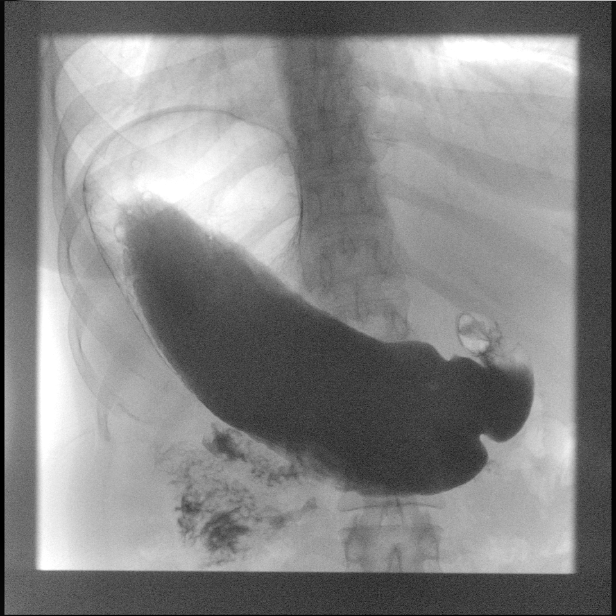

[Series 16: cp_standard · 0.26mm/px · 1 of 1 slices shown (14 of 14)]
[im 1/1]
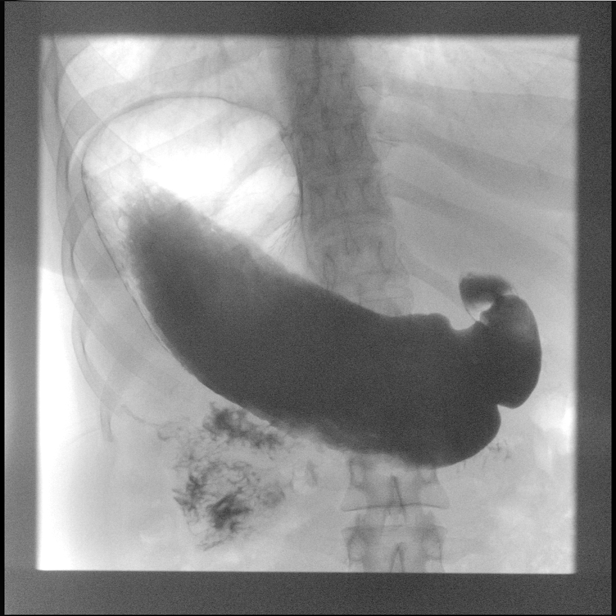

[14 of 15 positions shown; findings below may reference images not displayed]

FINDINGS: Limited evaluation as the patient is unable to tolerate thick
barium.

UPPER GI SERIES:

Examination of the esophagus demonstrated normal esophageal
motility. Normal esophageal morphology without evidence of
esophagitis or ulceration. No esophageal stricture, diverticula, or
mass lesion. No evidence of hiatal hernia. Severe gastroesophageal
reflux.

Examination of the stomach demonstrated normal rugal folds and areae
gastricae. Gastric mucosa appeared unremarkable without evidence of
ulceration, scarring, or mass lesion. Gastric motility and emptying
was normal. Fluoroscopic examination of the duodenum demonstrates
normal motility and morphology without evidence of ulceration or
mass lesion.
IMPRESSION: 1. Severe gastroesophageal reflux.

2.  Otherwise normal upper GI.

## 2021-07-02 ENCOUNTER — Other Ambulatory Visit: Payer: Self-pay | Admitting: Certified Nurse Midwife

## 2021-07-08 ENCOUNTER — Other Ambulatory Visit: Payer: Self-pay

## 2021-07-08 ENCOUNTER — Ambulatory Visit (INDEPENDENT_AMBULATORY_CARE_PROVIDER_SITE_OTHER): Payer: Medicaid Other | Admitting: Gastroenterology

## 2021-07-08 ENCOUNTER — Encounter: Payer: Self-pay | Admitting: Gastroenterology

## 2021-07-08 ENCOUNTER — Other Ambulatory Visit: Payer: Self-pay | Admitting: Neurology

## 2021-07-08 VITALS — BP 117/81 | HR 71 | Temp 98.3°F | Ht 62.0 in | Wt 112.8 lb

## 2021-07-08 DIAGNOSIS — K5900 Constipation, unspecified: Secondary | ICD-10-CM

## 2021-07-08 DIAGNOSIS — R109 Unspecified abdominal pain: Secondary | ICD-10-CM

## 2021-07-08 DIAGNOSIS — K219 Gastro-esophageal reflux disease without esophagitis: Secondary | ICD-10-CM

## 2021-07-08 MED ORDER — LINACLOTIDE 72 MCG PO CAPS: 72.0000 ug | ORAL_CAPSULE | Freq: Every day | ORAL | 1 refills | Status: DC
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
## 2021-07-19 ENCOUNTER — Other Ambulatory Visit: Payer: Self-pay | Admitting: Certified Nurse Midwife

## 2021-07-21 ENCOUNTER — Other Ambulatory Visit: Payer: Self-pay | Admitting: Gastroenterology

## 2021-07-22 ENCOUNTER — Ambulatory Visit
Admission: RE | Admit: 2021-07-22 | Discharge: 2021-07-22 | Disposition: A | Discharge status: Home / Self Care | Payer: Medicaid Other | Source: Ambulatory Visit | Attending: Gastroenterology | Admitting: Gastroenterology

## 2021-07-22 ENCOUNTER — Other Ambulatory Visit: Payer: Self-pay

## 2021-07-22 DIAGNOSIS — R109 Unspecified abdominal pain: Secondary | ICD-10-CM | POA: Diagnosis not present

## 2021-07-22 HISTORY — DX: Unspecified asthma, uncomplicated: J45.909

## 2021-07-22 IMAGING — CT CT ABD-PELV W/ CM
1 of 2 series · 15 of 32 positions shown, 19 images · IV contrast (omnipaque)
Comparison: None.

CLINICAL DATA: Abdominal pain, nausea, bloating

EXAM:
CT ABDOMEN AND PELVIS WITH CONTRAST
TECHNIQUE: Multidetector CT imaging of the abdomen and pelvis was performed
using the standard protocol following bolus administration of
intravenous contrast.
CONTRAST:  100mL OMNIPAQUE IOHEXOL 300 MG/ML  SOLN

[Series 2: axial st · axial · 0.68mm/px · z∈[-998,-578]mm · 15 of 92 slices shown, 19 images]
[im 4/92  soft-tissue]
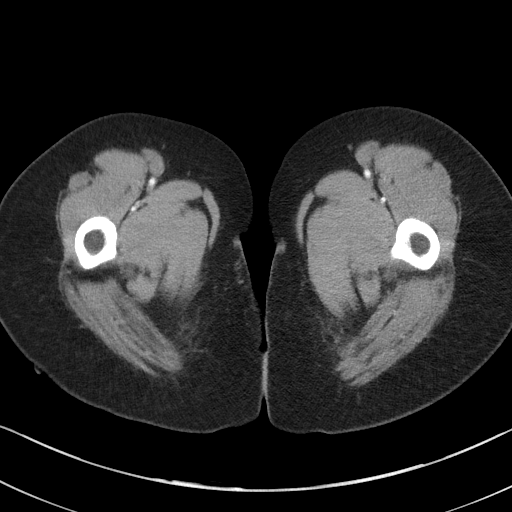
[im 4/92  bone]
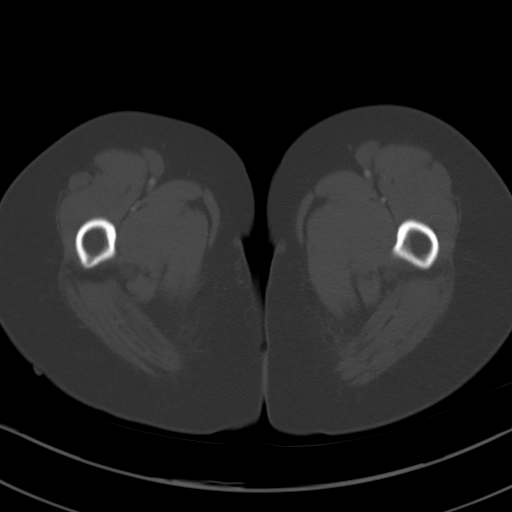
[im 12/92  soft-tissue]
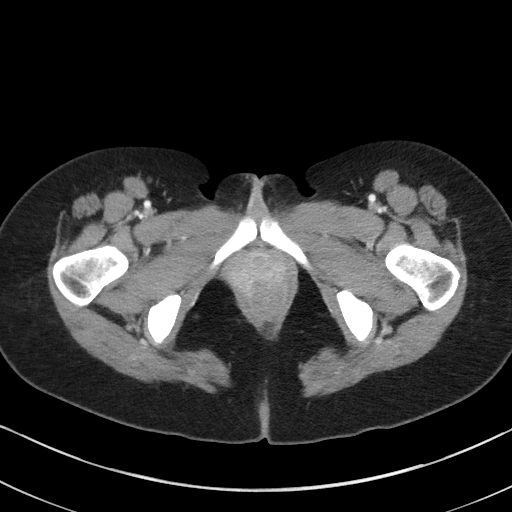
[im 20/92  soft-tissue]
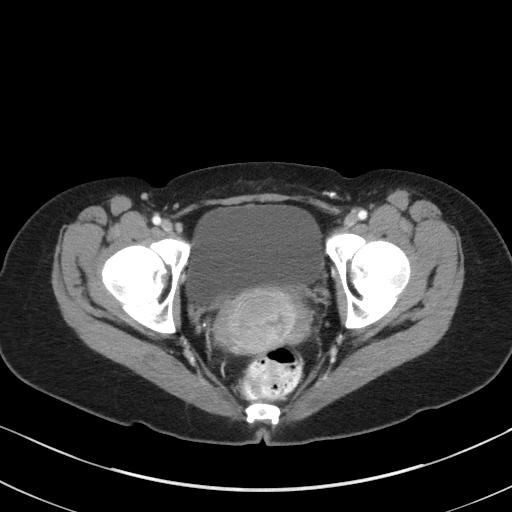
[im 24/92  soft-tissue]
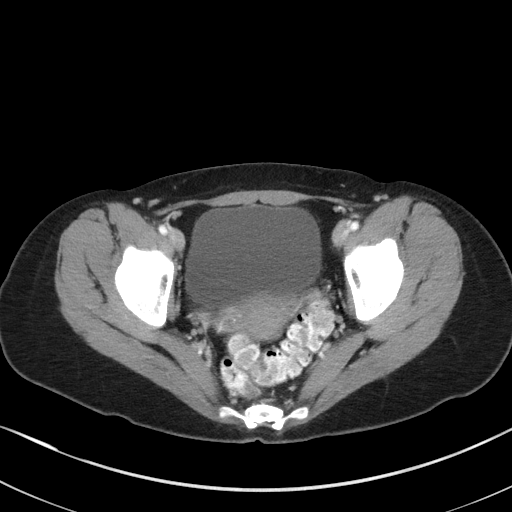
[im 32/92  soft-tissue]
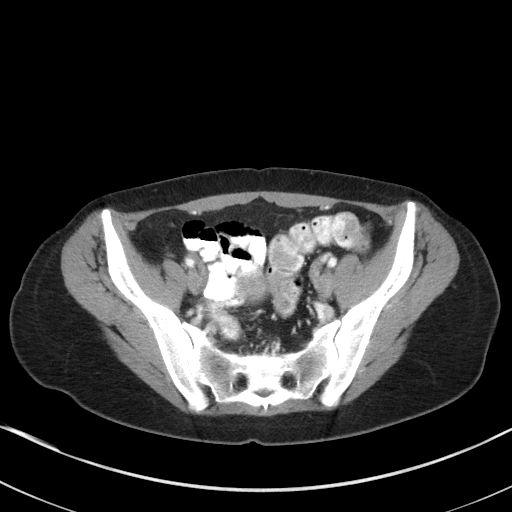
[im 40/92  soft-tissue]
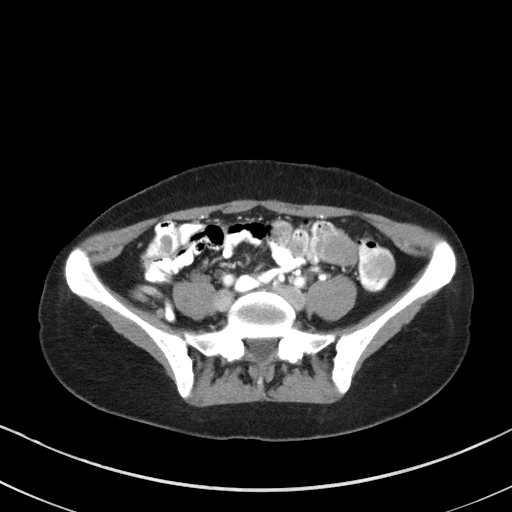
[im 48/92  soft-tissue]
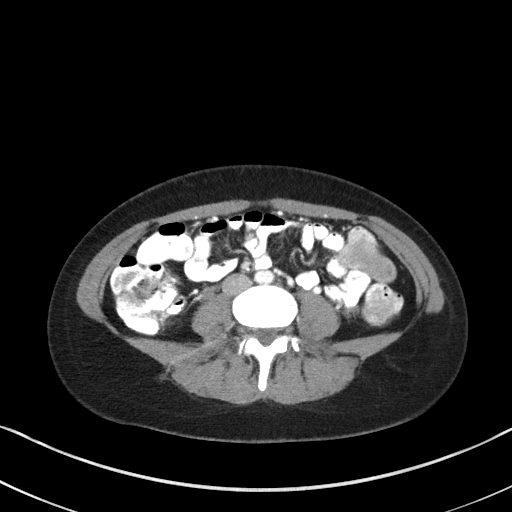
[im 52/92  soft-tissue]
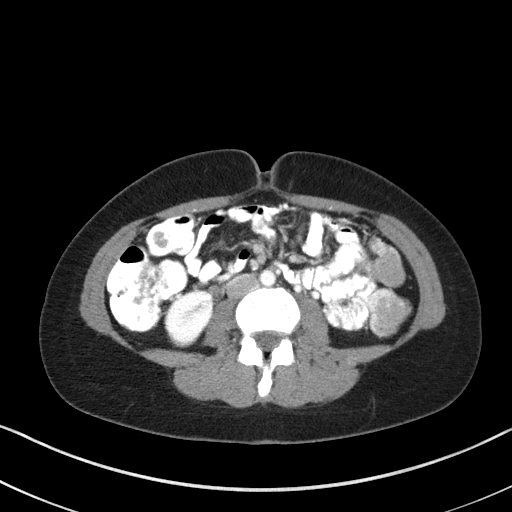
[im 60/92  soft-tissue]
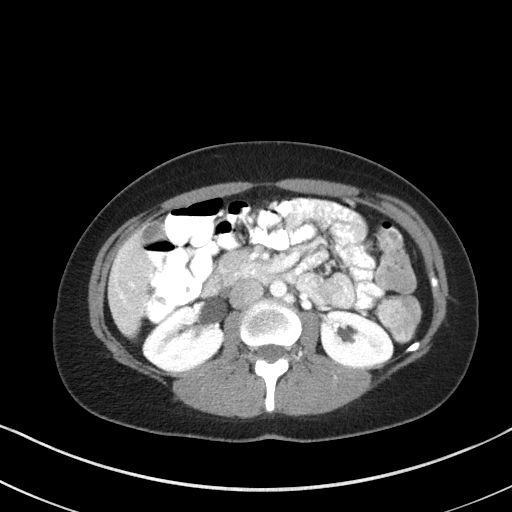
[im 60/92  bone]
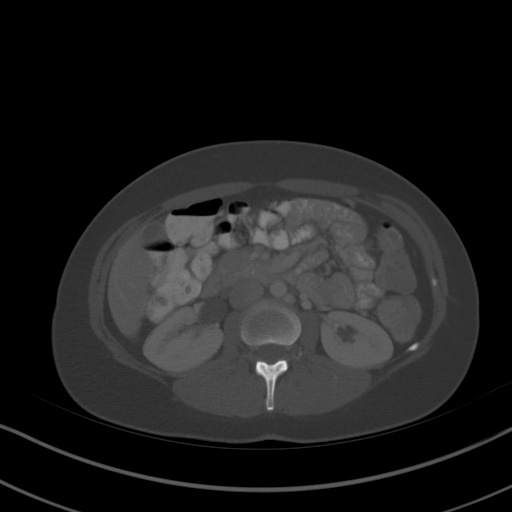
[im 68/92  soft-tissue]
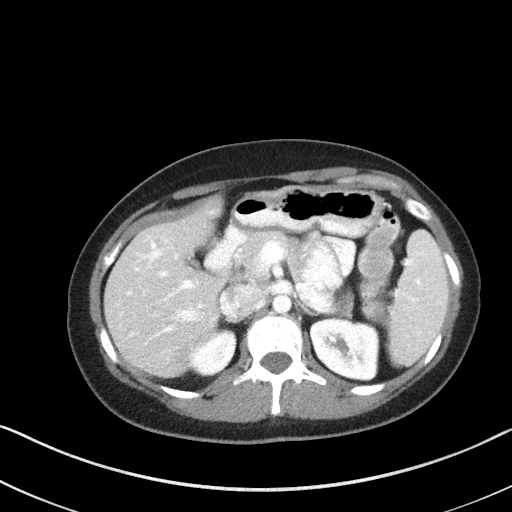
[im 72/92  soft-tissue]
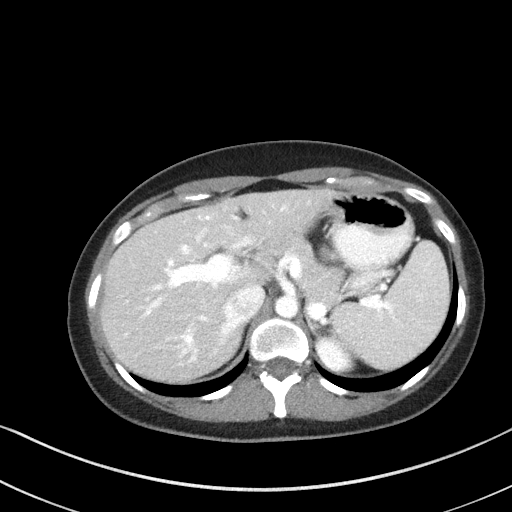
[im 76/92  lung]
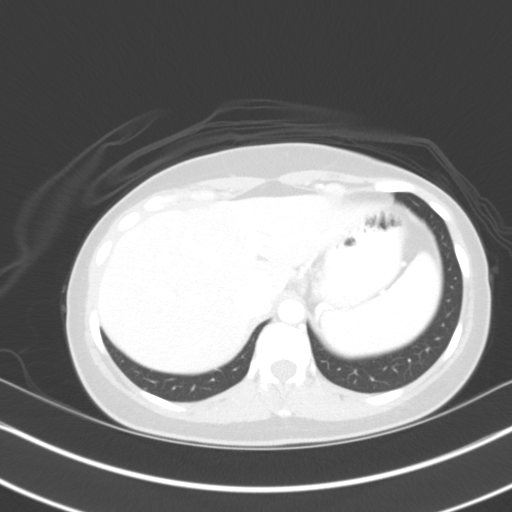
[im 80/92  soft-tissue]
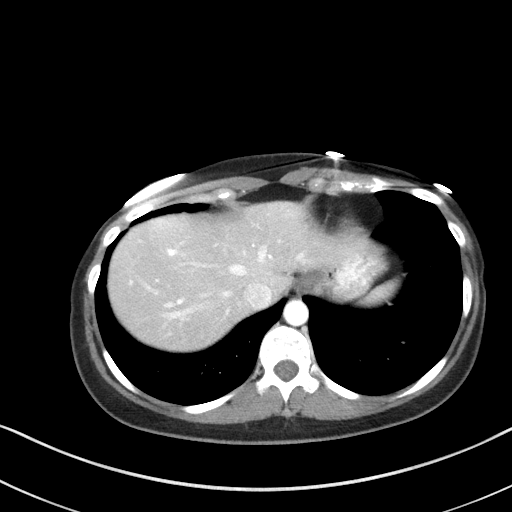
[im 80/92  lung]
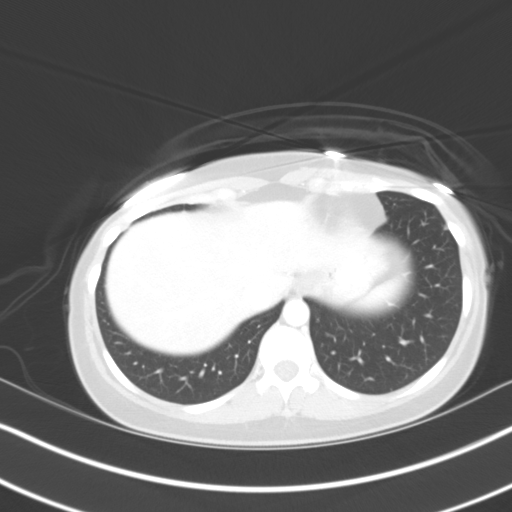
[im 84/92  lung]
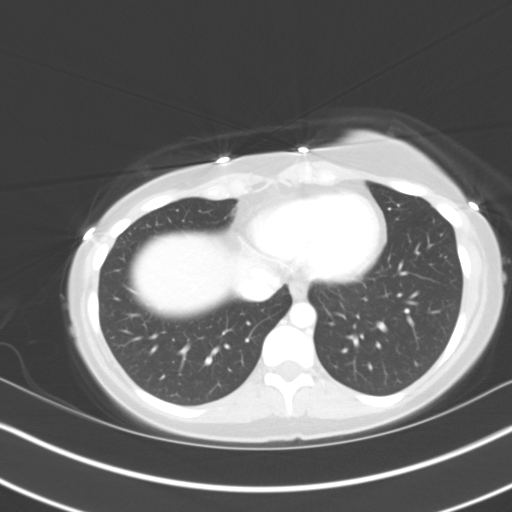
[im 88/92  soft-tissue]
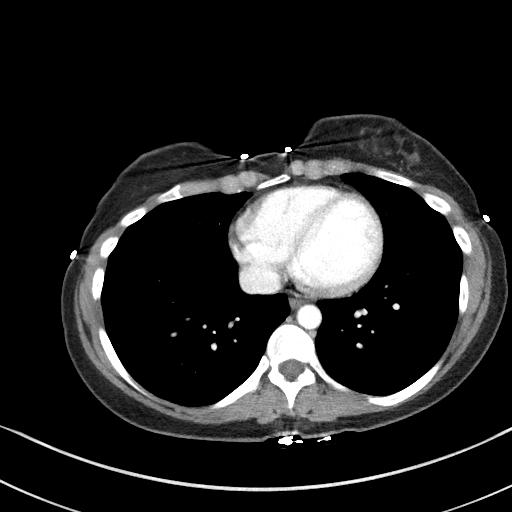
[im 88/92  lung]
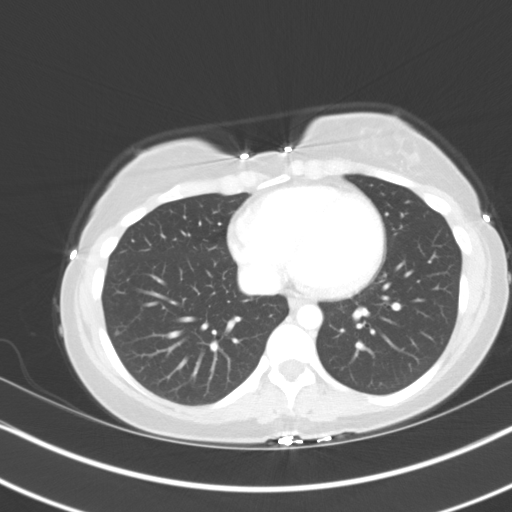

[15 of 32 positions shown; findings below may reference images not displayed]

FINDINGS: Lower chest: Lung bases are clear. No effusions. Heart is normal
size.

Hepatobiliary: No focal hepatic abnormality. Gallbladder
unremarkable.

Pancreas: No focal abnormality or ductal dilatation.

Spleen: No focal abnormality.  Normal size.

Adrenals/Urinary Tract: No adrenal abnormality. No focal renal
abnormality. No stones or hydronephrosis. Urinary bladder is
unremarkable.

Stomach/Bowel: Moderate stool burden throughout the colon. Normal
appendix. Stomach, large and small bowel grossly unremarkable.

Vascular/Lymphatic: No evidence of aneurysm or adenopathy.

Reproductive: Uterus and adnexa unremarkable.  No mass.

Other: No free fluid or free air.

Musculoskeletal: No acute bony abnormality.
IMPRESSION: No acute findings in the abdomen or pelvis.

Moderate stool burden throughout the colon.

## 2021-07-22 MED ORDER — IOHEXOL 300 MG/ML  SOLN
150.0000 mL | Freq: Once | INTRAMUSCULAR | Status: AC | PRN
  Administered 2021-07-22: 100 mL via INTRAVENOUS

## 2021-08-05 MED ORDER — ESOMEPRAZOLE MAGNESIUM 40 MG PO CPDR: 40.0000 mg | DELAYED_RELEASE_CAPSULE | Freq: Every day | ORAL | 0 refills | Status: DC

## 2021-08-05 NOTE — Addendum Note (Signed)
Addended by: Roena Malady on: 08/05/2021 10:52 AM   Modules accepted: Orders

## 2021-08-11 ENCOUNTER — Telehealth: Payer: Self-pay | Admitting: Certified Nurse Midwife

## 2021-08-11 ENCOUNTER — Other Ambulatory Visit: Payer: Self-pay

## 2021-08-11 ENCOUNTER — Ambulatory Visit: Payer: Medicaid Other | Admitting: Gastroenterology

## 2021-08-11 MED ORDER — NORETHINDRONE 0.35 MG PO TABS: 1.0000 | ORAL_TABLET | Freq: Every day | ORAL | 11 refills | Status: DC

## 2021-08-11 NOTE — Telephone Encounter (Signed)
Rx transferred to preferred pharmacy, patient notified via MyChart message .

## 2021-08-11 NOTE — Telephone Encounter (Signed)
Pt has transferred pharmacies- Mandy from [facility] in [place] called asking if we could send over rx for this patients birthcontrol. Please Advise.

## 2021-08-27 ENCOUNTER — Telehealth: Payer: Self-pay

## 2021-08-27 NOTE — Telephone Encounter (Signed)
New message    Christeen Lai (Key: [id number])Need help? Call us at [phone] Status Shared / Accessed Online(Not sent to plan) Drug Bernita Raisin 100MG  tablets  Form [employer] Call-In Form  To initiate the prior authorization process for this medication, please contact [employer] at [phone]. [phone] phone

## 2021-08-30 ENCOUNTER — Other Ambulatory Visit: Payer: Self-pay | Admitting: Internal Medicine

## 2021-08-30 DIAGNOSIS — R0602 Shortness of breath: Secondary | ICD-10-CM

## 2021-08-30 MED ORDER — MONTELUKAST SODIUM 10 MG PO TABS: ORAL_TABLET | ORAL | 0 refills | Status: DC

## 2021-08-30 NOTE — Telephone Encounter (Signed)
Requested Prescriptions  Pending Prescriptions Disp Refills  . montelukast (SINGULAIR) 10 MG tablet 90 tablet 0    Sig: TAKE 1 TABLET BY MOUTH EVERYDAY AT BEDTIME     Pulmonology:  Leukotriene Inhibitors Passed - 08/30/2021  6:58 PM      Passed - Valid encounter within last 12 months    Recent Outpatient Visits          3 months ago Dizziness   [facility] [place], Salvadore Oxford, NP   11 months ago Screening examination for STD (sexually transmitted disease)   [facility] Smitty Cords, DO   11 months ago Bilateral carpal tunnel syndrome   [facility], Jodelle Gross, FNP   1 year ago Rash and nonspecific skin eruption   [facility], Jodelle Gross, FNP   1 year ago Herpes simplex vulvovaginitis   [facility], Jodelle Gross, FNP      Future Appointments            In 1 month Maximino Greenland, Dolphus Jenny, MD [facility] [place]

## 2021-08-30 NOTE — Telephone Encounter (Signed)
Medication Refill - Medication: Montelukast  Has the patient contacted their pharmacy? No. Pt states that she changed pharmacies and is requesting to have this sent in. Please advise.  (Agent: If no, request that the patient contact the pharmacy for the refill.) (Agent: If yes, when and what did the pharmacy advise?)  Preferred Pharmacy (with phone number or street name):  Has the patient been  [facility] [place] [place] - [address]  [address] [place] [address]  Phone: [phone] Fax: [phone]  Hours: Not open 24 hours  seen for an appointment in the last year OR does the patient have an upcoming appointment? Yes.    Agent: Please be advised that RX refills may take up to 3 business days. We ask that you follow-up with your pharmacy.

## 2021-09-27 ENCOUNTER — Ambulatory Visit: Payer: Medicaid Other | Admitting: Neurology

## 2021-09-29 ENCOUNTER — Other Ambulatory Visit: Payer: Self-pay | Admitting: Certified Nurse Midwife

## 2021-09-29 MED ORDER — NORETHINDRONE-ETH ESTRADIOL 1-35 MG-MCG PO TABS: 1.0000 | ORAL_TABLET | Freq: Every day | ORAL | 11 refills | Status: DC

## 2021-10-02 ENCOUNTER — Other Ambulatory Visit: Payer: Self-pay | Admitting: Gastroenterology

## 2021-10-02 ENCOUNTER — Other Ambulatory Visit: Payer: Self-pay | Admitting: Internal Medicine

## 2021-10-02 DIAGNOSIS — R0602 Shortness of breath: Secondary | ICD-10-CM

## 2021-10-03 NOTE — Telephone Encounter (Signed)
Requested Prescriptions  Pending Prescriptions Disp Refills  . montelukast (SINGULAIR) 10 MG tablet [Pharmacy Med Name: MONTELUKAST SODIUM 10 MG TAB] 90 tablet 1    Sig: TAKE 1 TABLET BY MOUTH ONCE DAILY AT BEDTIME     Pulmonology:  Leukotriene Inhibitors Passed - 10/02/2021 12:24 PM      Passed - Valid encounter within last 12 months    Recent Outpatient Visits          4 months ago Dizziness   [facility] [place], Salvadore Oxford, NP   1 year ago Screening examination for STD (sexually transmitted disease)   [facility] Smitty Cords, DO   1 year ago Bilateral carpal tunnel syndrome   [facility], Jodelle Gross, FNP   1 year ago Rash and nonspecific skin eruption   [facility], Jodelle Gross, FNP   1 year ago Herpes simplex vulvovaginitis   [facility], Jodelle Gross, FNP      Future Appointments            In 1 week Pasty Spillers, MD [facility] [place]

## 2021-10-12 ENCOUNTER — Ambulatory Visit: Payer: Self-pay

## 2021-10-12 NOTE — Telephone Encounter (Signed)
Pt called in stating that she is in severe pain in her shoulders, neck, upper back and her hands tingle and go numb at times. Pt says she went to UC on Sunday and they recommended MRI and gave Referral but cant get an appt until after the new year. They prescribed her Robaxin 500mg  and he says that's not helping. Pt is having hard time getting relief. Has been using heating pad and Tylenol but nothing helps. Is calling to see if we can refer her elsewhere that can possibly get her in sooner. Attempted to schedule an appt but nothing available until 10/21/21. Called and spoke with Rachell, FC who says 14/1/22 is OOO this week and to send an CRM and she will let her nurse address this to see what is needed and will reach out to the patient. Advised pt of this info. Care advice given and pt verbalized understanding. No other questions/concerns noted.    Reason for Disposition  [1] MODERATE pain (e.g., interferes with normal activities) AND [2] present > 3 days  Answer Assessment - Initial Assessment Questions 1. ONSET: "When did the pain start?"     This past week 2. LOCATION: "Where is the pain located?"     Both shoulder, neck, upper back, and fingers and hands tingling and numb 3. PAIN: "How bad is the pain?" (Scale 1-10; or mild, moderate, severe)   - MILD (1-3): doesn't interfere with normal activities   - MODERATE (4-7): interferes with normal activities (e.g., work or school) or awakens from sleep   - SEVERE (8-10): excruciating pain, unable to do any normal activities, unable to move arm at all due to pain     8 4. WORK OR EXERCISE: "Has there been any recent work or exercise that involved this part of the body?"     no 5. CAUSE: "What do you think is causing the shoulder pain?"     unsure 6. OTHER SYMPTOMS: "Do you have any other symptoms?" (e.g., neck pain, swelling, rash, fever, numbness, weakness)     Neck pain, numbness 7. PREGNANCY: "Is there any chance you are pregnant?" "When was  your last menstrual period?"     No, 2 weeks ago  Protocols used: Shoulder Pain-A-AH

## 2021-10-13 ENCOUNTER — Ambulatory Visit (INDEPENDENT_AMBULATORY_CARE_PROVIDER_SITE_OTHER): Payer: Medicaid Other | Admitting: Gastroenterology

## 2021-10-13 ENCOUNTER — Encounter: Payer: Self-pay | Admitting: Gastroenterology

## 2021-10-13 VITALS — BP 115/78 | HR 101 | Temp 97.9°F | Wt 115.0 lb

## 2021-10-13 DIAGNOSIS — K219 Gastro-esophageal reflux disease without esophagitis: Secondary | ICD-10-CM | POA: Diagnosis not present

## 2021-10-13 MED ORDER — SUCRALFATE 1 GM/10ML PO SUSP: 1.0000 g | Freq: Three times a day (TID) | ORAL | 1 refills | Status: DC
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
## 2021-10-13 NOTE — Patient Instructions (Signed)
Referral has been placed to [facility] GI lab for pH testing  Their number is [phone]

## 2021-10-18 NOTE — Telephone Encounter (Signed)
Pt will need to be seen for UC follow up before referral can be made.

## 2021-10-19 NOTE — Telephone Encounter (Signed)
I sent the patient a mychart message with Reginas recommendation. The message was reviewed by the patient on 10/18/2021 @ 4:59pm.   Last read by Jeanelle Malling at 4:59 PM on 10/18/2021.

## 2021-10-25 ENCOUNTER — Telehealth: Payer: Self-pay

## 2021-10-25 NOTE — Telephone Encounter (Signed)
New message   Nayely Dingus (Key: [id number]) Aimovig 70MG /ML auto-injectors   Form [employer] Call-In Form  Plan Contact [phone] phone Created 3 days ago Sent to Plan Determination Call-in Form Please call the payer at [phone] to complete this PA.  To initiate an authorization request for this medication, please contact [employer] at [phone].

## 2021-10-27 ENCOUNTER — Other Ambulatory Visit: Payer: Self-pay

## 2021-10-27 ENCOUNTER — Encounter: Payer: Self-pay | Admitting: Internal Medicine

## 2021-10-27 ENCOUNTER — Ambulatory Visit (INDEPENDENT_AMBULATORY_CARE_PROVIDER_SITE_OTHER): Payer: BC Managed Care – PPO | Admitting: Internal Medicine

## 2021-10-27 VITALS — BP 104/73 | HR 94 | Temp 98.4°F | Resp 18 | Ht 62.0 in | Wt 118.4 lb

## 2021-10-27 DIAGNOSIS — M5412 Radiculopathy, cervical region: Secondary | ICD-10-CM

## 2021-10-27 MED ORDER — LIDOCAINE 5 % EX PTCH: 1.0000 | MEDICATED_PATCH | CUTANEOUS | 0 refills | Status: DC

## 2021-10-27 NOTE — Patient Instructions (Signed)
Cervical Radiculopathy ?Cervical radiculopathy means that a nerve in the neck (a cervical nerve) is pinched or bruised. This can happen because of an injury to the cervical spine (vertebrae) in the neck, or as a normal part of getting older. This condition can cause pain or loss of feeling (numbness) that runs from your neck all the way down to your arm and fingers. Often, this condition gets better with rest. Treatment may be needed if the condition does not get better. ?What are the causes? ?A neck injury. ?A bulging disk in your spine. ?Sudden muscle tightening (muscle spasms). ?Tight muscles in your neck due to overuse. ?Arthritis. ?Breakdown in the bones and joints of the spine (spondylosis) due to getting older. ?Bone spurs that form near the nerves in the neck. ?What are the signs or symptoms? ?Pain. The pain may: ?Run from the neck to the arm and hand. ?Be very bad or irritating. ?Get worse when you move your neck. ?Loss of feeling or tingling in your arm or hand. ?Weakness in your arm or hand, in very bad cases. ?How is this treated? ?In many cases, treatment is not needed for this condition. With rest, the condition often gets better over time. If treatment is needed, options may include: ?Wearing a soft neck collar (cervical collar) for short periods of time. ?Doing exercises (physical therapy) to strengthen your neck muscles. ?Taking medicines. ?Having shots (injections) in your spine, in very bad cases. ?Having surgery. This may be needed if other treatments do not help. The type of surgery that is used will depend on the cause of your condition. ?Follow these instructions at home: ?If you have a soft neck collar: ?Wear it as told by your doctor. Take it off only as told by your doctor. ?Ask your doctor if you can take the collar off for cleaning and bathing. If you are allowed to take the collar off for cleaning or bathing: ?Follow instructions from your doctor about how to take off the collar  safely. ?Clean the collar by wiping it with mild soap and water and drying it completely. ?Take out any removable pads in the collar every 1-2 days. Wash them by hand with soap and water. Let them air-dry completely before you put them back in the collar. ?Check your skin under the collar for redness or sores. If you see any, tell your doctor. ?Managing pain ?  ?Take over-the-counter and prescription medicines only as told by your doctor. ?If told, put ice on the painful area. To do this: ?If you have a soft neck collar, take if off as told by your doctor. ?Put ice in a plastic bag. ?Place a towel between your skin and the bag. ?Leave the ice on for 20 minutes, 2-3 times a day. ?Take off the ice if your skin turns bright red. This is very important. If you cannot feel pain, heat, or cold, you have a greater risk of damage to the area. ?If using ice does not help, you can try using heat. Use the heat source that your doctor recommends, such as a moist heat pack or a heating pad. ?Place a towel between your skin and the heat source. ?Leave the heat on for 20-30 minutes. ?Take off the heat if your skin turns bright red. This is very important. If you cannot feel pain, heat, or cold, you have a greater risk of getting burned. ?You may try a gentle neck and shoulder rub (massage). ?Activity ?Rest as needed. ?Return to your normal   activities when your doctor says that it is safe. ?Do exercises as told by your doctor or physical therapist. ?You may have to avoid lifting. Ask your doctor how much you can safely lift. ?General instructions ?Use a flat pillow when you sleep. ?Do not drive while wearing a soft neck collar. If you do not have a soft neck collar, ask your doctor if it is safe to drive while your neck heals. ?Ask your doctor if you should avoid driving or using machines while you are taking your medicine. ?Do not smoke or use any products that contain nicotine or tobacco. If you need help quitting, ask your  doctor. ?Keep all follow-up visits. ?Contact a doctor if: ?Your condition does not get better with treatment. ?Get help right away if: ?Your pain gets worse and medicine does not help. ?You lose feeling or feel weak in your hand, arm, face, or leg. ?You have a high fever. ?Your neck is stiff. ?You cannot control when you poop or pee (have incontinence). ?You have trouble with walking, balance, or talking. ?Summary ?Cervical radiculopathy means that a nerve in the neck is pinched or bruised. ?A nerve can get pinched from a bulging disk, arthritis, an injury to the neck, or other causes. ?Symptoms include pain, tingling, or loss of feeling that goes from the neck to the arm or hand. ?Weakness in your arm or hand can happen in very bad cases. ?Treatment may include resting, wearing a soft neck collar, and doing exercises. You might need to take medicines for pain. In very bad cases, shots or surgery may be needed. ?This information is not intended to replace advice given to you by your health care provider. Make sure you discuss any questions you have with your health care provider. ?Document Revised: 05/13/2021 Document Reviewed: 05/13/2021 ?Elsevier Patient Education ? 2022 [employer] ? ?
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
## 2021-10-28 ENCOUNTER — Other Ambulatory Visit: Payer: Self-pay | Admitting: Gastroenterology

## 2021-11-03 NOTE — Telephone Encounter (Signed)
F/u Your information has been submitted to [facility]. [employer] Ranger will review the request and notify you of the determination decision directly, typically within 72 hours of receiving all information.  You will also receive your request decision electronically. To check for an update later, open this request again from your dashboard.  If [employer] [place] has not responded within the specified timeframe or if you have any questions about your PA submission, contact [employer] [place] directly at [phone].  [name] (Key: [id number]) Aimovig 70MG /ML auto-injectors   Form Blue Form (CB) Created 12 days ago Sent to Plan 9 days ago Plan Response 4 minutes ago Submit Clinical Questions less than a minute ago Determination Wait for Determination Please wait for Advertising account executive to return a determination.

## 2021-11-08 ENCOUNTER — Ambulatory Visit: Payer: Self-pay | Admitting: *Deleted

## 2021-11-08 NOTE — Telephone Encounter (Signed)
°  Chief Complaint: questions about shingles Symptoms: none Frequency: na Pertinent Negatives: Patient denies na Disposition: [] ED /[] Urgent Care (no appt availability in office) / [] Appointment(In office/virtual)/ []  [facility]/ [] Home Care/ [] Refused Recommended Disposition  Additional Notes: Patient thinks she had exposure to shingles- can she get shingles?  Patient advised: The shingles sores contain the chickenpox virus. So while others cannot get shingles from you, it is possible that they can get chickenpox (if they never had it before).

## 2021-11-08 NOTE — Telephone Encounter (Signed)
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 Pt is concerned and requests call back for advice. Cb# [phone]      Reason for Disposition  Prevention of shingles (vaccine info), questions about  Answer Assessment - Initial Assessment Questions 1. APPEARANCE of RASH: "Describe the rash."      *No Answer* 2. LOCATION: "Where is the rash located?"      *No Answer* 3. ONSET: "When did the rash start?"      *No Answer* 4. ITCHING: "Does the rash itch?" If Yes, ask: "How bad is the itch?"  (Scale 1-10; or mild, moderate, severe)     *No Answer* 5. PAIN: "Does the rash hurt?" If Yes, ask: "How bad is the pain?"  (Scale 0-10; or none, mild, moderate, severe)    - NONE (0): no pain    - MILD (1-3): doesn't interfere with normal activities     - MODERATE (4-7): interferes with normal activities or awakens from sleep     - SEVERE (8-10): excruciating pain, unable to do any normal activities     *No Answer* 6. OTHER SYMPTOMS: "Do you have any other symptoms?" (e.g., fever)     *No Answer* 7. PREGNANCY: "Is there any chance you are pregnant?" "When was your last menstrual period?"     *No Answer*  Protocols used: Shingles (Zoster)-A-AH

## 2021-11-09 ENCOUNTER — Ambulatory Visit
Admission: RE | Admit: 2021-11-09 | Discharge: 2021-11-09 | Disposition: A | Discharge status: Home / Self Care | Payer: BC Managed Care – PPO | Source: Ambulatory Visit | Attending: Internal Medicine | Admitting: Internal Medicine

## 2021-11-09 ENCOUNTER — Other Ambulatory Visit: Payer: Self-pay

## 2021-11-09 ENCOUNTER — Encounter: Payer: Self-pay | Admitting: Internal Medicine

## 2021-11-09 DIAGNOSIS — M5412 Radiculopathy, cervical region: Secondary | ICD-10-CM

## 2021-11-09 DIAGNOSIS — M47812 Spondylosis without myelopathy or radiculopathy, cervical region: Secondary | ICD-10-CM | POA: Diagnosis not present

## 2021-11-09 IMAGING — MR MR CERVICAL SPINE W/O CM
5 series · 38 of 48 positions shown · non-contrast
Comparison: Radiographs of the cervical spine [DATE].

CLINICAL DATA: Cervical radiculopathy. Cervical radiculopathy, no
red flags. Additional history provided by scanning technologist:
Patient reports neck pain with bilateral shoulder pain for 1 month.

EXAM:
MRI CERVICAL SPINE WITHOUT CONTRAST
TECHNIQUE: Multiplanar, multisequence MR imaging of the cervical spine was
performed. No intravenous contrast was administered.

[Series 5: T2 · sagittal · 3.0mm · 0.62mm/px · 8 of 15 slices shown (1 of 2)]
[im 1/15]
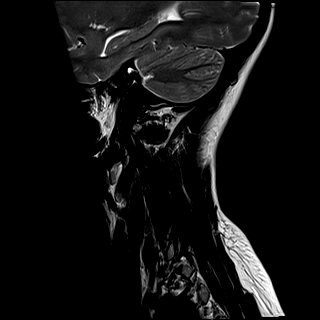
[im 3/15]
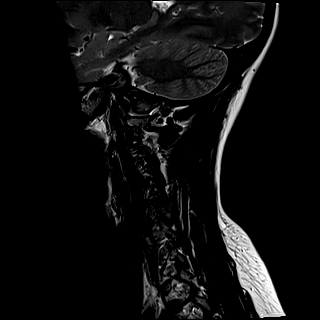
[im 5/15]
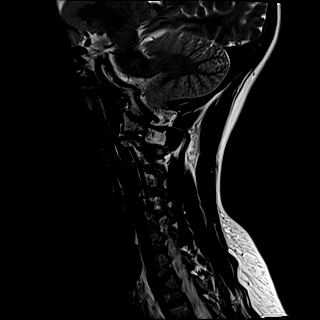
[im 7/15]
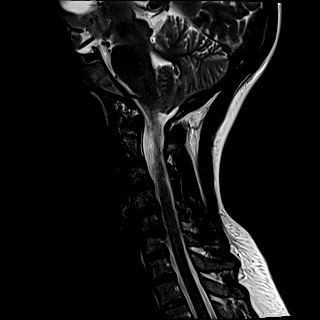
[im 9/15]
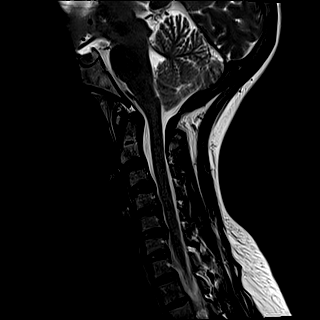
[im 11/15]
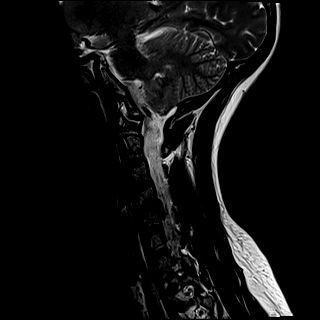
[im 13/15]
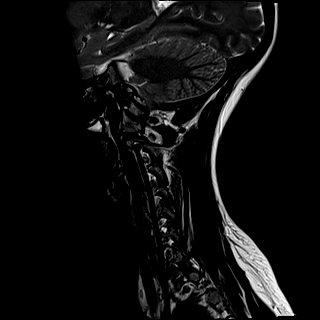
[im 15/15]
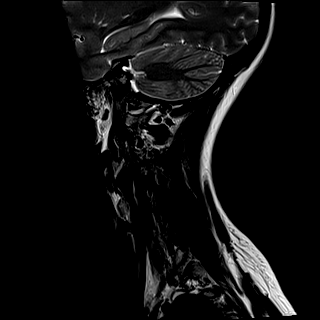

[Series 6: FLAIR · sagittal · 3.0mm · 0.78mm/px · 7 of 15 slices shown]
[im 1/15]
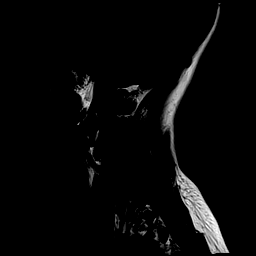
[im 3/15]
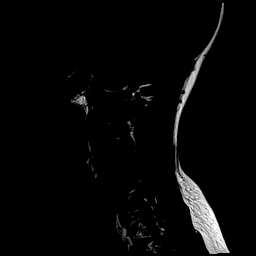
[im 5/15]
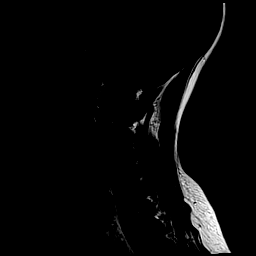
[im 8/15]
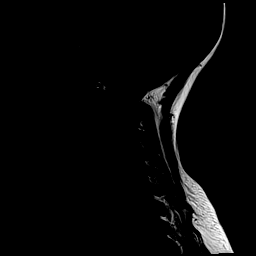
[im 10/15]
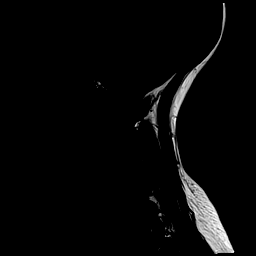
[im 12/15]
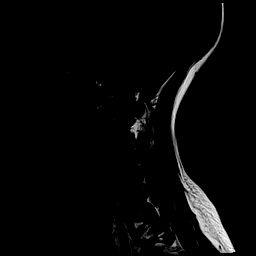
[im 15/15]
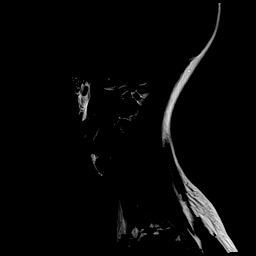

[Series 7: STIR · sagittal · 3.0mm · 0.62mm/px · 7 of 15 slices shown]
[im 1/15]
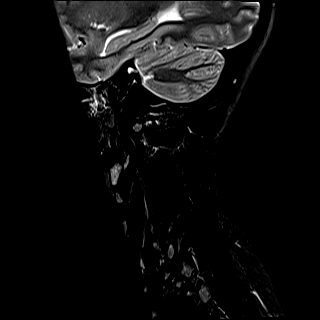
[im 3/15]
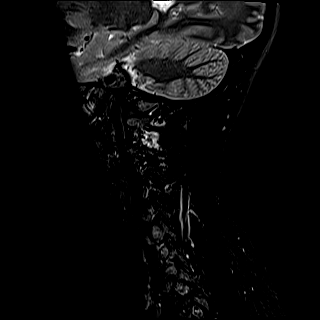
[im 5/15]
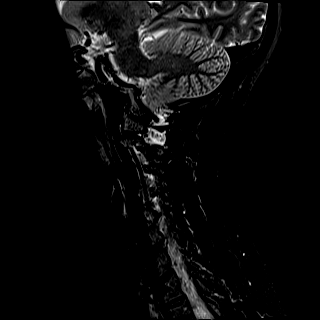
[im 8/15]
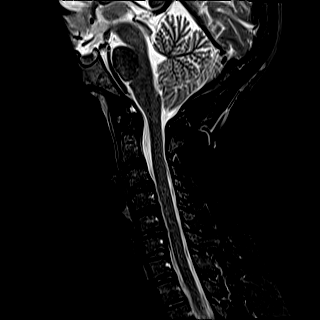
[im 10/15]
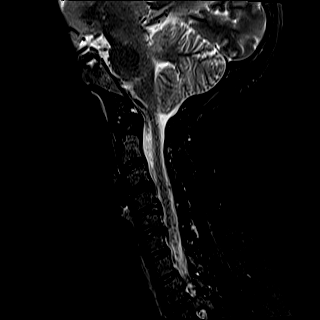
[im 12/15]
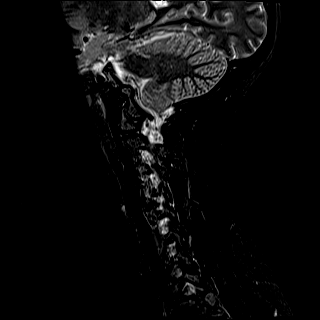
[im 15/15]
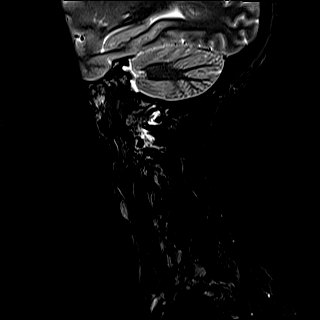

[Series 8: T2 · axial · 3.0mm · 0.70mm/px · z∈[-110,-22]mm · 9 of 27 slices shown (2 of 2)]
[im 1/27]
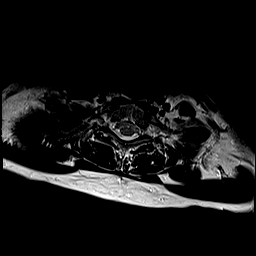
[im 5/27]
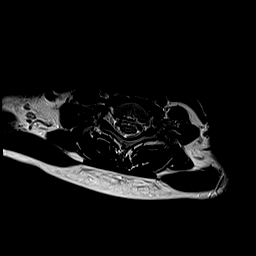
[im 9/27]
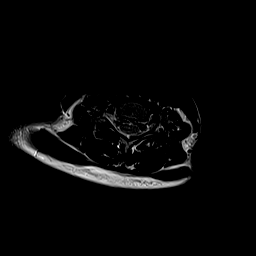
[im 11/27]
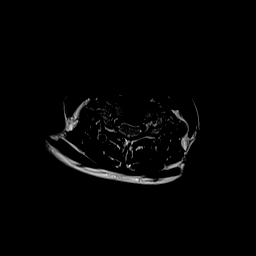
[im 14/27]
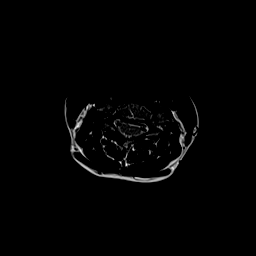
[im 16/27]
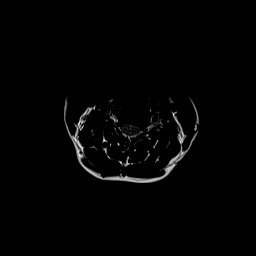
[im 18/27]
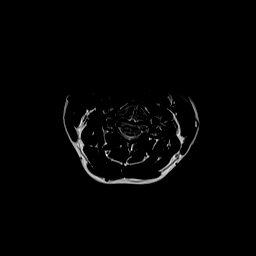
[im 22/27]
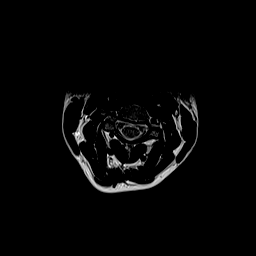
[im 27/27]
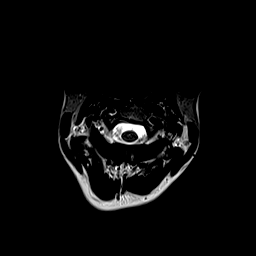

[Series 9: ax mpgr · axial · 3.0mm · 0.35mm/px · z∈[-110,-39]mm · 7 of 27 slices shown]
[im 1/27]
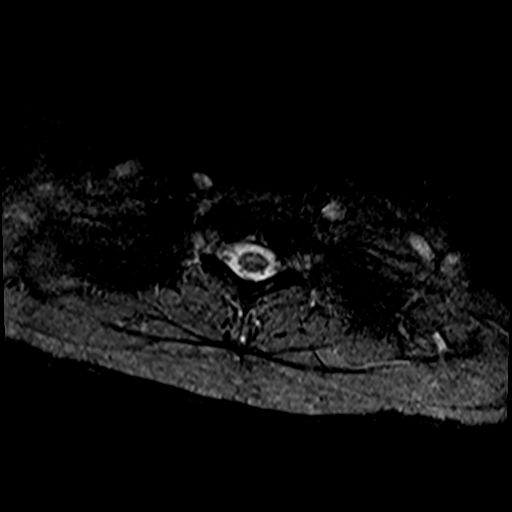
[im 5/27]
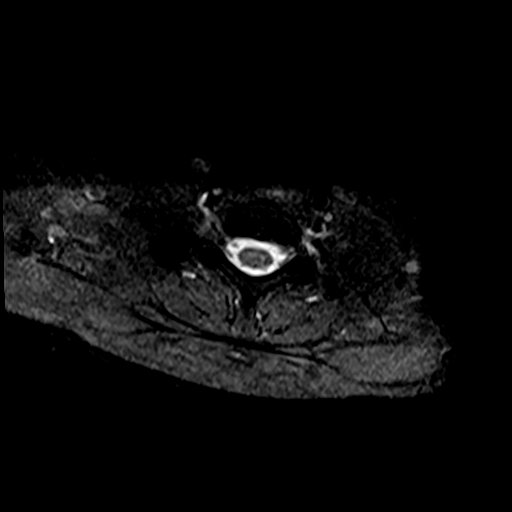
[im 9/27]
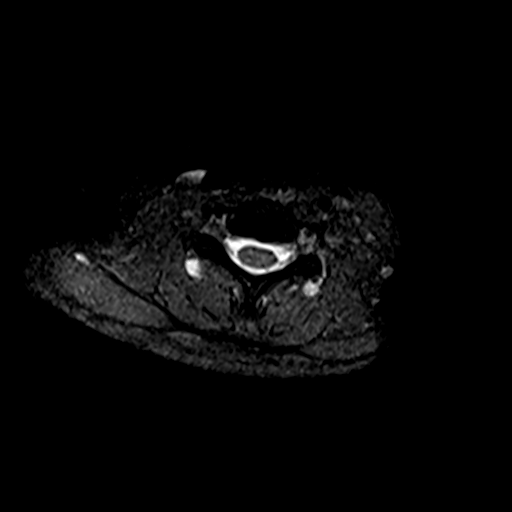
[im 11/27]
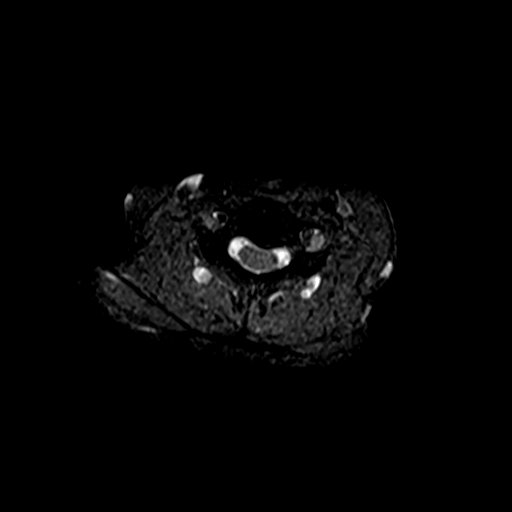
[im 16/27]
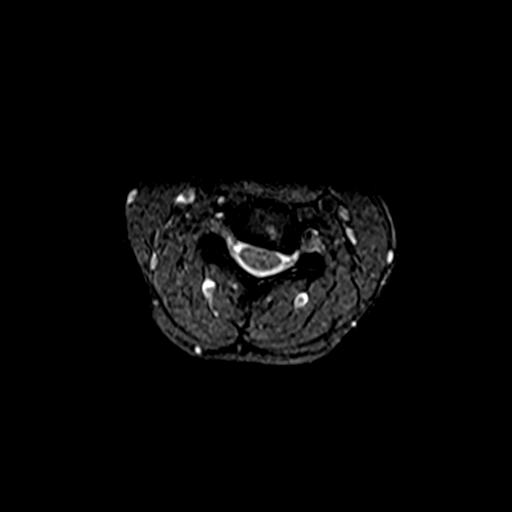
[im 18/27]
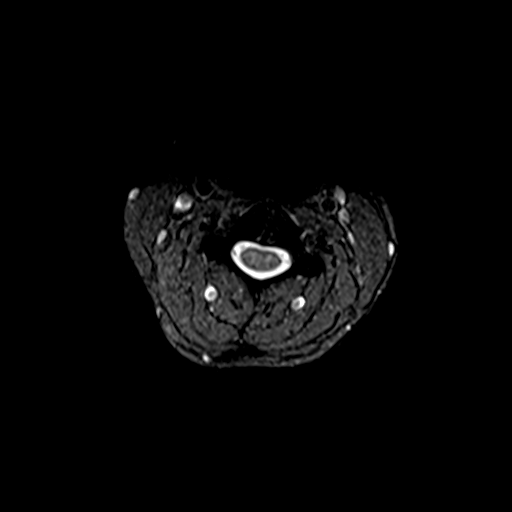
[im 22/27]
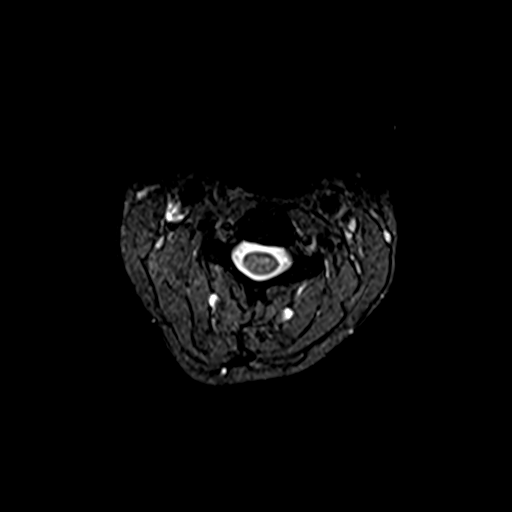

[38 of 48 positions shown; findings below may reference images not displayed]

FINDINGS: Alignment: Straightening of the expected cervical lordosis. Trace
C2-C3 and C3-C4 grade 1 anterolisthesis.

Vertebrae: Vertebral body height is maintained. No significant
marrow edema or focal suspicious osseous lesion.

Cord: No signal abnormality identified within the cervical spinal
cord.

Posterior Fossa, vertebral arteries, paraspinal tissues: No
abnormality identified within included portions of the posterior
fossa. Flow voids preserved within the imaged cervical vertebral
arteries.

Disc levels:

Mild multilevel disc degeneration, greatest at C5-C6.

C2-C3: No significant disc herniation or stenosis.

C3-C4: Shallow disc bulge. No significant spinal canal or foraminal
stenosis.

C4-C5: Shallow disc bulge. Superimposed left foraminal disc
protrusion. Uncovertebral hypertrophy (predominantly on the left).
No significant spinal canal stenosis. The disc protrusion results in
mild/moderate left neural foraminal narrowing.

C5-C6: Disc bulge. Superimposed shallow broad-based left center disc
protrusion. The disc protrusion focally effaces the ventral thecal
sac, mildly narrowing the spinal canal and contacting the ventral
aspect of the spinal cord. No significant foraminal stenosis.

C6-C7: No significant disc herniation or stenosis.

C7-T1: Facet arthrosis on the left. No significant disc herniation
or stenosis.
IMPRESSION: Cervical spondylosis, as outlined and with findings most notably as
follows.

At C4-C5, there is mild disc degeneration. Disc bulge. Superimposed
left foraminal disc protrusion. Uncovertebral hypertrophy
(predominantly on the left). Resultant mild/moderate left neural
foraminal narrowing. No significant spinal canal stenosis.

At C5-C6, there is a disc bulge with superimposed broad-based left
center disc protrusion. The disc protrusion focally effaces the
ventral thecal sac, mildly narrowing the spinal canal and contacting
the ventral spinal cord. No significant foraminal stenosis.

No significant spinal canal or foraminal stenosis at the remaining
levels.

Straightening of the expected cervical lordosis.

Trace C2-C3 and C3-C4 grade 1 anterolisthesis.

## 2021-11-23 ENCOUNTER — Ambulatory Visit: Payer: Self-pay

## 2021-11-23 NOTE — Telephone Encounter (Signed)
Chief Complaint: COVID positive home test on Saturday Symptoms: Mild SOB at rest, fatigue, chills, body aches, headache Frequency: Symptoms started Friday on the way from [place] Pertinent Negatives: Patient denies N/A Disposition: [] ED /[] Urgent Care (no appt availability in office) / [] Appointment(In office/virtual)/ [x]  [place] [facility]/ [] Home Care/ [] Refused Recommended Disposition /[]  Mobile Bus/ []  Follow-up with PCP Additional Notes: No openings in the office. Initially advised ED due to SOB, she says it's because she hasn't used her Dulera inhaler because she can't find it.    Pt is calling back to report that she tested positive for COVID on Saturday. Pt report chills, body aches,  No available appts until Thursday. Advised that message was sent to the Nurse. Awaiting a call back.   ----- Message from sent at 11/23/2021  9:20 AM EST -----  Patient tested positive for covid and needs medication. Would like a call back please  Reason for Disposition  MILD difficulty breathing (e.g., minimal/no SOB at rest, SOB with walking, pulse <100)  Answer Assessment - Initial Assessment Questions 1. COVID-19 DIAGNOSIS: "Who made your COVID-19 diagnosis?" "Was it confirmed by a positive lab test or self-test?" If not diagnosed by a doctor (or NP/PA), ask "Are there lots of cases (community spread) where you live?" Note: See public health department website, if unsure.     Home test on Saturday 2. COVID-19 EXPOSURE: "Was there any known exposure to COVID before the symptoms began?" CDC Definition of close contact: within 6 feet (2 meters) for a total of 15 minutes or more over a 24-hour period.      No, went to 3. ONSET: "When did the COVID-19 symptoms start?"      Friday 4. WORST SYMPTOM: "What is your worst symptom?" (e.g., cough, fever, shortness of breath, muscle aches)     Body aches, headache, SOB 5. COUGH: "Do you have a cough?" If Yes, ask: "How bad is the  cough?"       Yes 6. FEVER: "Do you have a fever?" If Yes, ask: "What is your temperature, how was it measured, and when did it start?"     No thermometer, but having chills, feeling hot 7. RESPIRATORY STATUS: "Describe your breathing?" (e.g., shortness of breath, wheezing, unable to speak)      SOB 8. BETTER-SAME-WORSE: "Are you getting better, staying the same or getting worse compared to yesterday?"  If getting worse, ask, "In what way?"     Better  9. HIGH RISK DISEASE: "Do you have any chronic medical problems?" (e.g., asthma, heart or lung disease, weak immune system, obesity, etc.)     N/A 10. VACCINE: "Have you had the COVID-19 vaccine?" If Yes, ask: "Which one, how many shots, when did you get it?"       N/A 11. BOOSTER: "Have you received your COVID-19 booster?" If Yes, ask: "Which one and when did you get it?"       N/A 12. PREGNANCY: "Is there any chance you are pregnant?" "When was your last menstrual period?"       N/A 13. OTHER SYMPTOMS: "Do you have any other symptoms?"  (e.g., chills, fatigue, headache, loss of smell or taste, muscle pain, sore throat)       Headache 14. O2 SATURATION MONITOR:  "Do you use an oxygen saturation monitor (pulse oximeter) at home?" If Yes, ask "What is your reading (oxygen level) today?" "What is your usual oxygen saturation reading?" (e.g., 95%)  N/A  Protocols used: Coronavirus (COVID-19) Diagnosed or Suspected-A-AH

## 2021-12-07 DIAGNOSIS — M25512 Pain in left shoulder: Secondary | ICD-10-CM | POA: Diagnosis not present

## 2021-12-07 DIAGNOSIS — M25511 Pain in right shoulder: Secondary | ICD-10-CM | POA: Diagnosis not present

## 2021-12-07 DIAGNOSIS — M542 Cervicalgia: Secondary | ICD-10-CM | POA: Diagnosis not present

## 2021-12-07 DIAGNOSIS — G8929 Other chronic pain: Secondary | ICD-10-CM | POA: Diagnosis not present

## 2022-01-13 DIAGNOSIS — G44229 Chronic tension-type headache, not intractable: Secondary | ICD-10-CM | POA: Diagnosis not present

## 2022-01-13 DIAGNOSIS — M7918 Myalgia, other site: Secondary | ICD-10-CM | POA: Diagnosis not present

## 2022-01-13 DIAGNOSIS — G43109 Migraine with aura, not intractable, without status migrainosus: Secondary | ICD-10-CM | POA: Diagnosis not present

## 2022-01-13 DIAGNOSIS — R202 Paresthesia of skin: Secondary | ICD-10-CM | POA: Diagnosis not present

## 2022-01-13 DIAGNOSIS — G540 Brachial plexus disorders: Secondary | ICD-10-CM | POA: Diagnosis not present

## 2022-01-13 NOTE — Telephone Encounter (Signed)
F/u  [name] (Key: [id number]) Aimovig 70MG /ML auto-injectors   Form Blue Building control surveyor Form (CB) Created 3 months ago Sent to Plan 3 months ago Plan Response 2 months ago Submit Clinical Questions 2 months ago Determination Favorable 2 months ago Message from Plan Effective from 11/03/2021 through 01/25/2022.

## 2022-01-20 ENCOUNTER — Telehealth: Payer: Self-pay

## 2022-01-20 NOTE — Telephone Encounter (Signed)
New message   [name] (Key: [id number]) Aimovig 70MG /ML auto-injectors   Form Blue Form (CB) Created 14 minutes ago Sent to Plan 13 minutes ago Plan Response 13 minutes ago Submit Clinical Questions less than a minute ago Determination Wait for Determination Please wait for Advertising account executive to return a determination.

## 2022-04-08 ENCOUNTER — Other Ambulatory Visit: Payer: Self-pay | Admitting: Internal Medicine

## 2022-04-08 DIAGNOSIS — R0602 Shortness of breath: Secondary | ICD-10-CM

## 2022-04-11 NOTE — Telephone Encounter (Signed)
Requested Prescriptions  Pending Prescriptions Disp Refills  . montelukast (SINGULAIR) 10 MG tablet [Pharmacy Med Name: MONTELUKAST SODIUM 10 MG TAB] 90 tablet 1    Sig: TAKE 1 TABLET BY MOUTH ONCE DAILY AT BEDTIME     Pulmonology:  Leukotriene Inhibitors Passed - 04/08/2022  4:30 PM      Passed - Valid encounter within last 12 months    Recent Outpatient Visits          5 months ago Cervical radiculopathy   [facility], Salvadore Oxford, NP   11 months ago Dizziness   [facility], Salvadore Oxford, NP   1 year ago Screening examination for STD (sexually transmitted disease)   [facility] Smitty Cords, DO   1 year ago Bilateral carpal tunnel syndrome   [facility], Jodelle Gross, FNP   2 years ago Rash and nonspecific skin eruption   [facility], Jodelle Gross, [place]

## 2022-04-28 DIAGNOSIS — M7918 Myalgia, other site: Secondary | ICD-10-CM | POA: Diagnosis not present

## 2022-04-28 DIAGNOSIS — R202 Paresthesia of skin: Secondary | ICD-10-CM | POA: Diagnosis not present

## 2022-04-28 DIAGNOSIS — Z6824 Body mass index (BMI) 24.0-24.9, adult: Secondary | ICD-10-CM | POA: Diagnosis not present

## 2022-04-28 DIAGNOSIS — G44229 Chronic tension-type headache, not intractable: Secondary | ICD-10-CM | POA: Diagnosis not present

## 2022-04-28 DIAGNOSIS — G43109 Migraine with aura, not intractable, without status migrainosus: Secondary | ICD-10-CM | POA: Diagnosis not present

## 2022-05-01 ENCOUNTER — Ambulatory Visit
Admission: EM | Admit: 2022-05-01 | Discharge: 2022-05-01 | Disposition: A | Discharge status: Home / Self Care | Payer: BC Managed Care – PPO

## 2022-05-01 ENCOUNTER — Encounter: Payer: Self-pay | Admitting: Emergency Medicine

## 2022-05-01 DIAGNOSIS — K130 Diseases of lips: Secondary | ICD-10-CM | POA: Diagnosis not present

## 2022-05-01 MED ORDER — VALACYCLOVIR HCL 1 G PO TABS: 2000.0000 mg | ORAL_TABLET | Freq: Two times a day (BID) | ORAL | 0 refills | Status: AC

## 2022-05-01 MED ORDER — MUPIROCIN 2 % EX OINT: 1.0000 "application " | TOPICAL_OINTMENT | Freq: Two times a day (BID) | CUTANEOUS | 0 refills | Status: DC
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
## 2022-05-01 NOTE — ED Triage Notes (Signed)
Patient states that she went to [facility].  Patient states that she woke up this morning and redness, swelling and pain in her upper lip.

## 2022-05-01 NOTE — Discharge Instructions (Addendum)
-  The lip lesion most resembles a cold sore.  I sent Valtrex to pharmacy. - We discussed other possibilities which could be infection so I sent an ointment to the pharmacy. - It is also possibly could have been stung by an insect.  I would start icing the area and taking nondrowsy Claritin. - Return or go to ER if swelling worsens.

## 2022-07-29 DIAGNOSIS — F4312 Post-traumatic stress disorder, chronic: Secondary | ICD-10-CM | POA: Diagnosis not present

## 2022-07-29 DIAGNOSIS — F900 Attention-deficit hyperactivity disorder, predominantly inattentive type: Secondary | ICD-10-CM | POA: Diagnosis not present

## 2022-07-29 DIAGNOSIS — F330 Major depressive disorder, recurrent, mild: Secondary | ICD-10-CM | POA: Diagnosis not present

## 2022-08-05 ENCOUNTER — Ambulatory Visit (INDEPENDENT_AMBULATORY_CARE_PROVIDER_SITE_OTHER): Payer: BC Managed Care – PPO | Admitting: Certified Nurse Midwife

## 2022-08-05 ENCOUNTER — Ambulatory Visit: Payer: BC Managed Care – PPO

## 2022-08-05 ENCOUNTER — Other Ambulatory Visit (HOSPITAL_COMMUNITY)
Admission: RE | Admit: 2022-08-05 | Discharge: 2022-08-05 | Disposition: A | Discharge status: Home / Self Care | Payer: BC Managed Care – PPO | Source: Ambulatory Visit | Attending: Certified Nurse Midwife | Admitting: Certified Nurse Midwife

## 2022-08-05 VITALS — BP 105/74 | HR 114 | Resp 16 | Ht 62.0 in | Wt 134.6 lb

## 2022-08-05 DIAGNOSIS — N898 Other specified noninflammatory disorders of vagina: Secondary | ICD-10-CM

## 2022-08-05 DIAGNOSIS — N760 Acute vaginitis: Secondary | ICD-10-CM | POA: Diagnosis not present

## 2022-08-05 DIAGNOSIS — M549 Dorsalgia, unspecified: Secondary | ICD-10-CM | POA: Diagnosis not present

## 2022-08-05 DIAGNOSIS — Z113 Encounter for screening for infections with a predominantly sexual mode of transmission: Secondary | ICD-10-CM

## 2022-08-05 LAB — POCT URINALYSIS DIPSTICK
Bilirubin, UA: NEGATIVE
Glucose, UA: NEGATIVE
Ketones, UA: NEGATIVE
Leukocytes, UA: NEGATIVE
Nitrite, UA: NEGATIVE
Protein, UA: POSITIVE — AB
Spec Grav, UA: 1.02 (ref 1.010–1.025)
Urobilinogen, UA: 0.2 E.U./dL
pH, UA: 7 (ref 5.0–8.0)

## 2022-08-05 LAB — POCT URINE PREGNANCY: Preg Test, Ur: NEGATIVE

## 2022-08-05 NOTE — Progress Notes (Unsigned)
Patient came in today for a nurse visit to be tested for bacterial vaginosis. She has the following symptoms x 1.5 weeks: Yellowish discharge and odor. She collected a self swab and wanted to be tested for everything. She also has concerns about having some back pain and abdominal cramping, she would like to be tested for UTI.  Urine culture was obtained and sent off. Urinalysis revealed a trace of blood, everything else was negative. She requested a urine pregnancy test. States that her LMP was 07/23/2022, but it only lasted for about 2 days. She does not desire pregnancy at this time. Results were negative. Advised patient according to UTI protocol.  Patient was also given samples of pH-D to treat odor until culture returns.   Increase water intake and add cranberry juice. Take extra strength Tylenol per label instructions or Ibuprofen 800 mg every 8 hours if you are not pregnant. AZO otc per label instructions. Start AZO after urine dip performed as it can contaminate specimen.

## 2022-08-09 ENCOUNTER — Encounter: Payer: Self-pay | Admitting: Certified Nurse Midwife

## 2022-08-09 ENCOUNTER — Other Ambulatory Visit: Payer: Self-pay | Admitting: Certified Nurse Midwife

## 2022-08-09 LAB — CERVICOVAGINAL ANCILLARY ONLY
Bacterial Vaginitis (gardnerella): POSITIVE — AB
Candida Glabrata: NEGATIVE
Candida Vaginitis: NEGATIVE
Chlamydia: NEGATIVE
Comment: NEGATIVE
Comment: NEGATIVE
Comment: NEGATIVE
Comment: NEGATIVE
Comment: NEGATIVE
Comment: NORMAL
Neisseria Gonorrhea: NEGATIVE
Trichomonas: NEGATIVE

## 2022-08-09 LAB — URINE CULTURE

## 2022-08-09 MED ORDER — METRONIDAZOLE 500 MG PO TABS: 500.0000 mg | ORAL_TABLET | Freq: Two times a day (BID) | ORAL | 0 refills | Status: DC

## 2022-08-12 ENCOUNTER — Other Ambulatory Visit: Payer: Self-pay | Admitting: Certified Nurse Midwife

## 2022-08-12 MED ORDER — METRONIDAZOLE 0.75 % EX GEL: 1.0000 | Freq: Every day | CUTANEOUS | 0 refills | Status: AC

## 2022-08-27 ENCOUNTER — Other Ambulatory Visit: Payer: Self-pay | Admitting: Certified Nurse Midwife

## 2022-08-27 DIAGNOSIS — Z30019 Encounter for initial prescription of contraceptives, unspecified: Secondary | ICD-10-CM

## 2022-08-29 NOTE — Telephone Encounter (Signed)
Refill sent in until patient can be seen for annual, made aware to schedule appt via Mychart

## 2022-09-23 ENCOUNTER — Ambulatory Visit (INDEPENDENT_AMBULATORY_CARE_PROVIDER_SITE_OTHER): Payer: BC Managed Care – PPO | Admitting: Internal Medicine

## 2022-09-23 ENCOUNTER — Encounter: Payer: Self-pay | Admitting: Internal Medicine

## 2022-09-23 DIAGNOSIS — R0602 Shortness of breath: Secondary | ICD-10-CM | POA: Diagnosis not present

## 2022-09-23 DIAGNOSIS — J4520 Mild intermittent asthma, uncomplicated: Secondary | ICD-10-CM | POA: Diagnosis not present

## 2022-09-23 DIAGNOSIS — J45909 Unspecified asthma, uncomplicated: Secondary | ICD-10-CM | POA: Insufficient documentation

## 2022-09-23 MED ORDER — FLUTICASONE PROPIONATE 50 MCG/ACT NA SUSP: 1.0000 | Freq: Every day | NASAL | 6 refills | Status: DC

## 2022-09-23 MED ORDER — ALBUTEROL SULFATE HFA 108 (90 BASE) MCG/ACT IN AERS: INHALATION_SPRAY | RESPIRATORY_TRACT | 0 refills | Status: DC

## 2022-09-23 MED ORDER — DULERA 200-5 MCG/ACT IN AERO: INHALATION_SPRAY | RESPIRATORY_TRACT | 0 refills | Status: DC

## 2022-09-23 MED ORDER — DOXYCYCLINE HYCLATE 100 MG PO TABS: 100.0000 mg | ORAL_TABLET | Freq: Two times a day (BID) | ORAL | 0 refills | Status: DC

## 2022-09-23 NOTE — Assessment & Plan Note (Signed)
Dulera and albuterol refilled today

## 2022-09-23 NOTE — Patient Instructions (Signed)
Sinus Pain  Sinus pain may occur when your sinuses become clogged or swollen. Sinuses are air-filled spaces in your skull that are behind the bones of your face and forehead. Sinus pain can range from mild to severe. What are the causes? Sinus pain can result from various conditions that affect the sinuses. Common causes include: Colds. Sinus infections. Allergies. What are the signs or symptoms? The main symptom of this condition is pain or pressure in your face, forehead, ears, or upper teeth. People who have sinus pain often have other symptoms, such as: Congested or runny nose. Fever. Inability to smell. Headache. Weather changes can make symptoms worse. How is this diagnosed? Your health care provider will diagnose this condition based on your symptoms and a physical exam. If you have pain that keeps coming back or does not go away, your health care provider may recommend more testing. This may include: Imaging tests, such as a CT scan or MRI, to check for problems with your sinuses. Examination of your sinuses using a thin tool with a camera that is inserted through your nose (endoscopy). How is this treated? Treatment for this condition depends on the cause. Sinus pain that is caused by a sinus infection may be treated with antibiotic medicine. Sinus pain that is caused by congestion may be helped by rinsing out (flushing) the nose and sinuses with saline solution. Sinus pain that is caused by allergies may be helped by allergy medicines (antihistamines) and medicated nasal sprays. Sinus surgery may be needed in some cases if other treatments do not help. Follow these instructions at home: General instructions If directed: Apply a warm, moist washcloth to your face to help relieve pain. Use a nasal saline wash. Follow the directions on the bottle or box. Hydrate and humidify Drink enough water to keep your urine clear or pale yellow. Staying hydrated will help to thin your  mucus. Use a humidifier if your home is dry. Inhale steam for 10-15 minutes, 3-4 times a day or as told by your health care provider. You can do this in the bathroom while a hot shower is running. Limit your exposure to cool or dry air. Medicines  Take over-the-counter and prescription medicines only as told by your health care provider. If you were prescribed an antibiotic medicine, take it as told by your health care provider. Do not stop taking the antibiotic even if you start to feel better. If you have congestion, use a nasal spray to help lessen pressure. Contact a health care provider if: You have sinus pain more than one time a week. You have sensitivity to light or sound. You develop a fever. You feel nauseous or you vomit. Your sinus pain or headache does not get better with treatment. Get help right away if: You have vision problems. You have sudden, severe pain in your face or head. You have a seizure. You are confused. You have a stiff neck. Summary Sinus pain occurs when your sinuses become clogged or swollen. Sinus pain can result from various conditions that affect the sinuses, such as a cold, a sinus infection, or an allergy. Treatment for this condition depends on the cause. It may include medicine, such as antibiotics or antihistamines. This information is not intended to replace advice given to you by your health care provider. Make sure you discuss any questions you have with your health care provider. Document Revised: 10/10/2021 Document Reviewed: 10/10/2021 Elsevier Patient Education  2023 Elsevier Inc[employer]  
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
## 2022-10-28 ENCOUNTER — Encounter: Payer: Self-pay | Admitting: Internal Medicine

## 2022-10-28 ENCOUNTER — Ambulatory Visit (INDEPENDENT_AMBULATORY_CARE_PROVIDER_SITE_OTHER): Payer: BC Managed Care – PPO | Admitting: Internal Medicine

## 2022-10-28 VITALS — BP 122/76 | HR 94 | Temp 96.9°F | Ht 62.0 in | Wt 136.0 lb

## 2022-10-28 DIAGNOSIS — M79601 Pain in right arm: Secondary | ICD-10-CM

## 2022-10-28 DIAGNOSIS — R768 Other specified abnormal immunological findings in serum: Secondary | ICD-10-CM | POA: Diagnosis not present

## 2022-10-28 DIAGNOSIS — Z23 Encounter for immunization: Secondary | ICD-10-CM

## 2022-10-28 DIAGNOSIS — G43709 Chronic migraine without aura, not intractable, without status migrainosus: Secondary | ICD-10-CM

## 2022-10-28 DIAGNOSIS — M79602 Pain in left arm: Secondary | ICD-10-CM

## 2022-10-28 DIAGNOSIS — Z0001 Encounter for general adult medical examination with abnormal findings: Secondary | ICD-10-CM

## 2022-10-28 DIAGNOSIS — R202 Paresthesia of skin: Secondary | ICD-10-CM

## 2022-10-28 MED ORDER — LIDOCAINE 5 % EX PTCH: 1.0000 | MEDICATED_PATCH | CUTANEOUS | 2 refills | Status: DC
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
## 2022-10-28 NOTE — Patient Instructions (Signed)

## 2022-10-31 ENCOUNTER — Encounter: Payer: Self-pay | Admitting: Internal Medicine

## 2022-10-31 DIAGNOSIS — E7800 Pure hypercholesterolemia, unspecified: Secondary | ICD-10-CM

## 2022-10-31 DIAGNOSIS — R768 Other specified abnormal immunological findings in serum: Secondary | ICD-10-CM

## 2022-10-31 DIAGNOSIS — M2550 Pain in unspecified joint: Secondary | ICD-10-CM

## 2022-10-31 LAB — COMPLETE METABOLIC PANEL WITHOUT GFR
AG Ratio: 1.6 (calc) (ref 1.0–2.5)
ALT: 18 U/L (ref 6–29)
AST: 16 U/L (ref 10–30)
Albumin: 4.3 g/dL (ref 3.6–5.1)
Alkaline phosphatase (APISO): 73 U/L (ref 31–125)
BUN: 14 mg/dL (ref 7–25)
CO2: 26 mmol/L (ref 20–32)
Calcium: 9.5 mg/dL (ref 8.6–10.2)
Chloride: 106 mmol/L (ref 98–110)
Creat: 0.74 mg/dL (ref 0.50–0.97)
Globulin: 2.7 g/dL (ref 1.9–3.7)
Glucose, Bld: 78 mg/dL (ref 65–99)
Potassium: 4.4 mmol/L (ref 3.5–5.3)
Sodium: 141 mmol/L (ref 135–146)
Total Bilirubin: 0.8 mg/dL (ref 0.2–1.2)
Total Protein: 7 g/dL (ref 6.1–8.1)
eGFR: 109 mL/min/1.73m2 (ref >=60)

## 2022-10-31 LAB — CBC
HCT: 39.5 % (ref 35.0–45.0)
Hemoglobin: 13.5 g/dL (ref 11.7–15.5)
MCH: 31.8 pg (ref 27.0–33.0)
MCHC: 34.2 g/dL (ref 32.0–36.0)
MCV: 93.2 fL (ref 80.0–100.0)
MPV: 10.7 fL (ref 7.5–12.5)
Platelets: 349 10*3/uL (ref 140–400)
RBC: 4.24 10*6/uL (ref 3.80–5.10)
RDW: 12.2 % (ref 11.0–15.0)
WBC: 8 10*3/uL (ref 3.8–10.8)

## 2022-10-31 LAB — ANTI-NUCLEAR AB-TITER (ANA TITER): ANA Titer 1: 1:80 {titer} — ABNORMAL HIGH

## 2022-10-31 LAB — ANA: Anti Nuclear Antibody (ANA): POSITIVE — AB

## 2022-10-31 LAB — LIPID PANEL
Cholesterol: 200 mg/dL — ABNORMAL HIGH (ref <200)
HDL: 49 mg/dL — ABNORMAL LOW (ref >=50)
LDL Cholesterol (Calc): 128 mg/dL (calc) — ABNORMAL HIGH
Non-HDL Cholesterol (Calc): 151 mg/dL (calc) — ABNORMAL HIGH (ref <130)
Total CHOL/HDL Ratio: 4.1 (calc) (ref <5.0)
Triglycerides: 124 mg/dL (ref <150)

## 2022-10-31 MED ORDER — SIMVASTATIN 10 MG PO TABS: 10.0000 mg | ORAL_TABLET | Freq: Every day | ORAL | 1 refills | Status: DC

## 2022-11-01 NOTE — Addendum Note (Signed)
Addended by: Lorre Munroe on: 11/01/2022 12:52 PM   Modules accepted: Orders

## 2022-11-24 ENCOUNTER — Other Ambulatory Visit: Payer: Self-pay | Admitting: Internal Medicine

## 2022-11-24 DIAGNOSIS — R0602 Shortness of breath: Secondary | ICD-10-CM

## 2022-11-24 NOTE — Telephone Encounter (Signed)
Requested Prescriptions  Pending Prescriptions Disp Refills   albuterol (VENTOLIN HFA) 108 (90 Base) MCG/ACT inhaler [Pharmacy Med Name: VENTOLIN HFA 108 (90 BASE) MCG/ACT] 8.5 g 0    Sig: INHALE 1-2 PUFFS INTO THE LUNGS EVERY 6 HOURS AS NEEDED FOR SHORTNESS OF BREATH     Pulmonology:  Beta Agonists 2 Passed - 11/24/2022 11:28 AM      Passed - Last BP in normal range    BP Readings from Last 1 Encounters:  10/28/22 122/76         Passed - Last Heart Rate in normal range    Pulse Readings from Last 1 Encounters:  10/28/22 94         Passed - Valid encounter within last 12 months    Recent Outpatient Visits           3 weeks ago Encounter for general adult medical examination with abnormal findings   [facility] [place], Coralie Keens, NP   2 months ago Mild intermittent asthma without complication   [facility] [place], Coralie Keens, NP   1 year ago Cervical radiculopathy   [facility] [place], Coralie Keens, NP   1 year ago [facility] [place], Coralie Keens, NP   2 years ago Screening examination for STD (sexually transmitted disease)   [place], Devonne Doughty, DO

## 2022-12-21 ENCOUNTER — Encounter: Payer: Self-pay | Admitting: Internal Medicine

## 2022-12-21 DIAGNOSIS — S049XXD Injury of unspecified cranial nerve, subsequent encounter: Secondary | ICD-10-CM

## 2022-12-22 ENCOUNTER — Other Ambulatory Visit: Payer: Self-pay | Admitting: Internal Medicine

## 2022-12-22 DIAGNOSIS — R0602 Shortness of breath: Secondary | ICD-10-CM

## 2022-12-22 DIAGNOSIS — F1511 Other stimulant abuse, in remission: Secondary | ICD-10-CM | POA: Diagnosis not present

## 2022-12-22 DIAGNOSIS — F900 Attention-deficit hyperactivity disorder, predominantly inattentive type: Secondary | ICD-10-CM | POA: Diagnosis not present

## 2022-12-22 DIAGNOSIS — F4312 Post-traumatic stress disorder, chronic: Secondary | ICD-10-CM | POA: Diagnosis not present

## 2022-12-22 DIAGNOSIS — F330 Major depressive disorder, recurrent, mild: Secondary | ICD-10-CM | POA: Diagnosis not present

## 2022-12-22 MED ORDER — BACLOFEN 10 MG PO TABS: ORAL_TABLET | ORAL | 0 refills | Status: DC

## 2022-12-22 MED ORDER — UBRELVY 100 MG PO TABS: 1.0000 | ORAL_TABLET | ORAL | 2 refills | Status: DC | PRN

## 2022-12-22 MED ORDER — GABAPENTIN 300 MG PO CAPS: ORAL_CAPSULE | ORAL | 0 refills | Status: DC

## 2022-12-22 NOTE — Telephone Encounter (Signed)
Requested Prescriptions  Pending Prescriptions Disp Refills   albuterol (VENTOLIN HFA) 108 (90 Base) MCG/ACT inhaler [Pharmacy Med Name: VENTOLIN HFA 108 (90 BASE) MCG/ACT] 18 g 0    Sig: INHALE 1-2 PUFFS INTO THE LUNGS EVERY 6 HOURS AS NEEDED FOR SHORTNESS OF BREATH     Pulmonology:  Beta Agonists 2 Passed - 12/22/2022 11:51 AM      Passed - Last BP in normal range    BP Readings from Last 1 Encounters:  10/28/22 122/76         Passed - Last Heart Rate in normal range    Pulse Readings from Last 1 Encounters:  10/28/22 94         Passed - Valid encounter within last 12 months    Recent Outpatient Visits           1 month ago Encounter for general adult medical examination with abnormal findings   [facility] [place], [place] W, NP   3 months ago Mild intermittent asthma without complication   [facility] [place], Coralie Keens, NP   1 year ago Cervical radiculopathy   [facility] [place], Coralie Keens, NP   1 year ago [facility] [place], Coralie Keens, NP   2 years ago Screening examination for STD (sexually transmitted disease)   [place], DO

## 2022-12-27 DIAGNOSIS — F330 Major depressive disorder, recurrent, mild: Secondary | ICD-10-CM | POA: Diagnosis not present

## 2022-12-27 DIAGNOSIS — F4312 Post-traumatic stress disorder, chronic: Secondary | ICD-10-CM | POA: Diagnosis not present

## 2022-12-27 DIAGNOSIS — F900 Attention-deficit hyperactivity disorder, predominantly inattentive type: Secondary | ICD-10-CM | POA: Diagnosis not present

## 2023-01-10 DIAGNOSIS — R5382 Chronic fatigue, unspecified: Secondary | ICD-10-CM | POA: Diagnosis not present

## 2023-01-10 DIAGNOSIS — R768 Other specified abnormal immunological findings in serum: Secondary | ICD-10-CM | POA: Diagnosis not present

## 2023-01-10 DIAGNOSIS — E538 Deficiency of other specified B group vitamins: Secondary | ICD-10-CM | POA: Diagnosis not present

## 2023-01-10 DIAGNOSIS — M533 Sacrococcygeal disorders, not elsewhere classified: Secondary | ICD-10-CM | POA: Diagnosis not present

## 2023-01-10 DIAGNOSIS — M791 Myalgia, unspecified site: Secondary | ICD-10-CM | POA: Diagnosis not present

## 2023-01-10 DIAGNOSIS — M2550 Pain in unspecified joint: Secondary | ICD-10-CM | POA: Diagnosis not present

## 2023-02-17 ENCOUNTER — Other Ambulatory Visit: Payer: Self-pay | Admitting: Certified Nurse Midwife

## 2023-02-17 ENCOUNTER — Other Ambulatory Visit: Payer: Self-pay | Admitting: Internal Medicine

## 2023-02-17 DIAGNOSIS — R0602 Shortness of breath: Secondary | ICD-10-CM

## 2023-02-17 DIAGNOSIS — Z30019 Encounter for initial prescription of contraceptives, unspecified: Secondary | ICD-10-CM

## 2023-02-17 NOTE — Telephone Encounter (Signed)
Requested Prescriptions  Pending Prescriptions Disp Refills   montelukast (SINGULAIR) 10 MG tablet [Pharmacy Med Name: MONTELUKAST SODIUM 10 MG TAB] 90 tablet 0    Sig: TAKE 1 TABLET BY MOUTH ONCE DAILY AT BEDTIME     Pulmonology:  Leukotriene Inhibitors Passed - 02/17/2023  3:26 PM      Passed - Valid encounter within last 12 months    Recent Outpatient Visits           3 months ago Encounter for general adult medical examination with abnormal findings   [facility] [place], [place] W, NP   4 months ago Mild intermittent asthma without complication   [facility] [place], Coralie Keens, NP   1 year ago Cervical radiculopathy   [facility] [place], Coralie Keens, NP   1 year ago [facility] [place], Coralie Keens, NP   2 years ago Screening examination for STD (sexually transmitted disease)   [place] J, DO               albuterol (VENTOLIN HFA) 108 (90 Base) MCG/ACT inhaler [Pharmacy Med Name: VENTOLIN HFA 108 (90 BASE) MCG/ACT] 18 g 0    Sig: INHALE 1-2 PUFFS INTO THE LUNGS EVERY 6 HOURS AS NEEDED FOR SHORTNESS OF BREATH     Pulmonology:  Beta Agonists 2 Passed - 02/17/2023  3:26 PM      Passed - Last BP in normal range    BP Readings from Last 1 Encounters:  10/28/22 122/76         Passed - Last Heart Rate in normal range    Pulse Readings from Last 1 Encounters:  10/28/22 94         Passed - Valid encounter within last 12 months    Recent Outpatient Visits           3 months ago Encounter for general adult medical examination with abnormal findings   [facility] [place], [place] W, NP   4 months ago Mild intermittent asthma without complication   [facility] [place], Coralie Keens, NP   1 year ago Cervical radiculopathy   [facility] [place],  Coralie Keens, NP   1 year ago [facility] [place], Coralie Keens, NP   2 years ago Screening examination for STD (sexually transmitted disease)   [place], DO

## 2023-02-21 ENCOUNTER — Ambulatory Visit
Admission: EM | Admit: 2023-02-21 | Discharge: 2023-02-21 | Disposition: A | Discharge status: Home / Self Care | Payer: BC Managed Care – PPO | Attending: Emergency Medicine | Admitting: Emergency Medicine

## 2023-02-21 DIAGNOSIS — J069 Acute upper respiratory infection, unspecified: Secondary | ICD-10-CM | POA: Insufficient documentation

## 2023-02-21 DIAGNOSIS — J020 Streptococcal pharyngitis: Secondary | ICD-10-CM | POA: Insufficient documentation

## 2023-02-21 LAB — GROUP A STREP BY PCR: Group A Strep by PCR: DETECTED — AB

## 2023-02-21 MED ORDER — AZITHROMYCIN 250 MG PO TABS: 250.0000 mg | ORAL_TABLET | Freq: Every day | ORAL | 0 refills | Status: DC

## 2023-02-21 MED ORDER — IPRATROPIUM BROMIDE 0.06 % NA SOLN: 2.0000 | Freq: Four times a day (QID) | NASAL | 12 refills | Status: DC
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
## 2023-02-21 NOTE — ED Triage Notes (Signed)
Pt c/o bilateral ear pain/fullness & sore throat x2 days. Denies any fevers,chills or bodyaches.

## 2023-02-21 NOTE — Discharge Instructions (Signed)
Take the Azithromycin daily for 5 days for treatment of your strep throat.  Gargle with warm salt water 2-3 times a day to soothe your throat, aid in pain relief, and aid in healing.  Take over-the-counter ibuprofen according to the package instructions as needed for pain.  You can also use Chloraseptic or Sucrets lozenges, 1 lozenge every 2 hours as needed for throat pain.  Continue to use your albuterol and Dulera as previously prescribed to help with your breathing symptoms.  As we discussed, I am prescribing you Atrovent nasal spray that you can use for nasal congestion.  You can instill 2 squirts in each nostril every 6 hours.  With alleviation of nasal congestion we will significant respiratory symptoms improve.  If your respiratory symptoms do not improve either return for reevaluation or see your primary care provider.  If you develop any new or worsening symptoms return for reevaluation.

## 2023-03-02 ENCOUNTER — Telehealth: Payer: Self-pay

## 2023-03-02 DIAGNOSIS — Z30019 Encounter for initial prescription of contraceptives, unspecified: Secondary | ICD-10-CM

## 2023-03-02 MED ORDER — NORTREL 1/35 (21) 1-35 MG-MCG PO TABS: 1.0000 | ORAL_TABLET | Freq: Every day | ORAL | 0 refills | Status: DC

## 2023-03-02 NOTE — Telephone Encounter (Signed)
[name] called triage line stating she needed a refill on her birth control, she's scheduled for 03/31/2023 for annual.  I sent in one refill.

## 2023-03-07 DIAGNOSIS — F329 Major depressive disorder, single episode, unspecified: Secondary | ICD-10-CM | POA: Diagnosis not present

## 2023-03-21 DIAGNOSIS — F329 Major depressive disorder, single episode, unspecified: Secondary | ICD-10-CM | POA: Diagnosis not present

## 2023-03-22 DIAGNOSIS — Z1371 Encounter for nonprocreative screening for genetic disease carrier status: Secondary | ICD-10-CM

## 2023-03-22 HISTORY — DX: Encounter for nonprocreative screening for genetic disease carrier status: Z13.71

## 2023-03-27 ENCOUNTER — Other Ambulatory Visit: Payer: Self-pay | Admitting: Internal Medicine

## 2023-03-27 ENCOUNTER — Other Ambulatory Visit: Payer: Self-pay | Admitting: Advanced Practice Midwife

## 2023-03-27 DIAGNOSIS — Z30019 Encounter for initial prescription of contraceptives, unspecified: Secondary | ICD-10-CM

## 2023-03-27 DIAGNOSIS — R0602 Shortness of breath: Secondary | ICD-10-CM

## 2023-03-28 ENCOUNTER — Other Ambulatory Visit: Payer: Self-pay | Admitting: Internal Medicine

## 2023-03-28 MED ORDER — GABAPENTIN 300 MG PO CAPS: ORAL_CAPSULE | ORAL | 0 refills | Status: DC

## 2023-03-28 MED ORDER — BACLOFEN 10 MG PO TABS: ORAL_TABLET | ORAL | 0 refills | Status: DC

## 2023-03-28 NOTE — Telephone Encounter (Signed)
Requested Prescriptions  Pending Prescriptions Disp Refills   fluticasone (FLONASE) 50 MCG/ACT nasal spray [Pharmacy Med Name: FLUTICASONE PROPIONATE 50 MCG/ACT N] 16 g 6    Sig: PLACE 1 SPRAY INTO BOTH NOSTRILS ONCE DAILY     Ear, Nose, and Throat: Nasal Preparations - Corticosteroids Passed - 03/27/2023  6:00 PM      Passed - Valid encounter within last 12 months    Recent Outpatient Visits           5 months ago Encounter for general adult medical examination with abnormal findings   [place] [facility] [place], [place] W, NP   6 months ago Mild intermittent asthma without complication   [place] [facility] [place], Salvadore Oxford, NP   1 year ago Cervical radiculopathy   [place] [facility] [place], Salvadore Oxford, NP   1 year ago Dizziness   [place] [facility] [place], Salvadore Oxford, NP   2 years ago Screening examination for STD (sexually transmitted disease)   [place] [facility], Netta Neat, DO               albuterol (VENTOLIN HFA) 108 (90 Base) MCG/ACT inhaler [Pharmacy Med Name: VENTOLIN HFA 108 (90 BASE) MCG/ACT] 18 g 0    Sig: INHALE 1-2 PUFFS INTO THE LUNGS EVERY 6 HOURS AS NEEDED FOR SHORTNESS OF BREATH     Pulmonology:  Beta Agonists 2 Passed - 03/27/2023  6:00 PM      Passed - Last BP in normal range    BP Readings from Last 1 Encounters:  02/21/23 106/75         Passed - Last Heart Rate in normal range    Pulse Readings from Last 1 Encounters:  02/21/23 (!) 108         Passed - Valid encounter within last 12 months    Recent Outpatient Visits           5 months ago Encounter for general adult medical examination with abnormal findings   [place] [facility] [place], [place] W, NP   6 months ago Mild intermittent asthma without complication   [place] [facility] [place], Salvadore Oxford, NP   1 year ago Cervical radiculopathy   [place]  [facility] [place], Salvadore Oxford, NP   1 year ago Dizziness   [place] [facility] [place], Salvadore Oxford, NP   2 years ago Screening examination for STD (sexually transmitted disease)   [place] [facility] [place], Netta Neat, DO

## 2023-03-31 ENCOUNTER — Other Ambulatory Visit (HOSPITAL_COMMUNITY)
Admission: RE | Admit: 2023-03-31 | Discharge: 2023-03-31 | Disposition: A | Discharge status: Home / Self Care | Payer: BC Managed Care – PPO | Source: Ambulatory Visit | Attending: Advanced Practice Midwife | Admitting: Advanced Practice Midwife

## 2023-03-31 ENCOUNTER — Encounter: Payer: Self-pay | Admitting: Advanced Practice Midwife

## 2023-03-31 ENCOUNTER — Ambulatory Visit (INDEPENDENT_AMBULATORY_CARE_PROVIDER_SITE_OTHER): Payer: BC Managed Care – PPO | Admitting: Advanced Practice Midwife

## 2023-03-31 VITALS — BP 112/75 | HR 88 | Ht 62.0 in | Wt 129.0 lb

## 2023-03-31 DIAGNOSIS — Z8049 Family history of malignant neoplasm of other genital organs: Secondary | ICD-10-CM | POA: Diagnosis not present

## 2023-03-31 DIAGNOSIS — Z113 Encounter for screening for infections with a predominantly sexual mode of transmission: Secondary | ICD-10-CM | POA: Diagnosis not present

## 2023-03-31 DIAGNOSIS — Z8041 Family history of malignant neoplasm of ovary: Secondary | ICD-10-CM | POA: Diagnosis not present

## 2023-03-31 DIAGNOSIS — Z13220 Encounter for screening for lipoid disorders: Secondary | ICD-10-CM | POA: Diagnosis not present

## 2023-03-31 DIAGNOSIS — Z1159 Encounter for screening for other viral diseases: Secondary | ICD-10-CM | POA: Diagnosis not present

## 2023-03-31 DIAGNOSIS — Z124 Encounter for screening for malignant neoplasm of cervix: Secondary | ICD-10-CM | POA: Diagnosis not present

## 2023-03-31 DIAGNOSIS — Z01419 Encounter for gynecological examination (general) (routine) without abnormal findings: Secondary | ICD-10-CM | POA: Diagnosis not present

## 2023-03-31 DIAGNOSIS — Z30019 Encounter for initial prescription of contraceptives, unspecified: Secondary | ICD-10-CM

## 2023-03-31 MED ORDER — NORTREL 1/35 (21) 1-35 MG-MCG PO TABS
1.0000 | ORAL_TABLET | Freq: Every day | ORAL | 4 refills | Status: DC
Start: 2023-03-31 — End: 2024-04-08

## 2023-03-31 NOTE — Progress Notes (Signed)
[facility]  Gynecology Annual Exam   PCP: Lorre Munroe, NP  Chief Complaint:  Chief Complaint  Patient presents with   Annual Exam    History of Present Illness: Patient is a 37 y.o. [phone] presents for annual exam. The patient has no complaints today.   LMP: Patient's last menstrual period was 03/03/2023. Average Interval: regular, 28 days Duration of flow:  4-5  days Heavy Menses: no Clots: no Intermenstrual Bleeding: no Postcoital Bleeding: no Dysmenorrhea: no  The patient is sexually active. She currently uses OCP (estrogen/progesterone) for contraception. She denies dyspareunia.  The patient does perform self breast exams.  There is notable family history of breast or ovarian cancer in her family. Her maternal grandmother had ovarian cancer. She accepts [facility] testing today.  The patient wears seatbelts: yes.   The patient has regular exercise:  She reports primarily sedentary lifestyle. She reports healthy diet and adequate hydration. She usually has 4 hours of sleep mainly due to having been in school finishing Masters Degree; Mental Health Care .    The patient denies current symptoms of depression.  She reports current dose of Lamictal is correct.  Review of Systems: Review of Systems  Constitutional:  Negative for chills and fever.  HENT:  Negative for congestion, ear discharge, ear pain, hearing loss, sinus pain and sore throat.   Eyes:  Negative for blurred vision and double vision.  Respiratory:  Negative for cough, shortness of breath and wheezing.   Cardiovascular:  Negative for chest pain, palpitations and leg swelling.  Gastrointestinal:  Negative for abdominal pain, blood in stool, constipation, diarrhea, heartburn, melena, nausea and vomiting.  Genitourinary:  Negative for dysuria, flank pain, frequency, hematuria and urgency.  Musculoskeletal:  Negative for back pain, joint pain and myalgias.  Skin:  Negative for itching and rash.  Neurological:   Negative for dizziness, tingling, tremors, sensory change, speech change, focal weakness, seizures, loss of consciousness, weakness and headaches.  Endo/Heme/Allergies:  Negative for environmental allergies. Does not bruise/bleed easily.  Psychiatric/Behavioral:  Negative for depression, hallucinations, memory loss, substance abuse and suicidal ideas. The patient is not nervous/anxious and does not have insomnia.     Past Medical History:  Patient Active Problem List   Diagnosis Date Noted   Asthma 09/23/2022   IBS (irritable bowel syndrome) 03/16/2020   ADHD (attention deficit hyperactivity disorder), inattentive type    Major depressive disorder, recurrent episode with anxious distress (HCC)    PTSD (post-traumatic stress disorder)    Genital herpes 11/05/2018   Chronic migraine without aura without status migrainosus, not intractable 01/18/2016    Past Surgical History:  Past Surgical History:  Procedure Laterality Date   ESOPHAGOGASTRODUODENOSCOPY (EGD) WITH PROPOFOL N/A 05/28/2021   Procedure: ESOPHAGOGASTRODUODENOSCOPY (EGD) WITH PROPOFOL;  Surgeon: Pasty Spillers, MD;  Location: [facility] ENDOSCOPY;  Service: Endoscopy;  Laterality: N/A;   LAPAROSCOPY  2010   LYMPHADENECTOMY Right    "size of a baseball"    Gynecologic History:  Patient's last menstrual period was 03/03/2023. Contraception: OCP (estrogen/progesterone) Last Pap: 2019 Results were: no abnormalities   Obstetric History: [id number]  Family History:  Family History  Problem Relation Age of Onset   Diabetes Father    Diabetes Maternal Grandfather    Ovarian cancer Paternal Grandmother 40       < age 38, or possibly cervical CA?   Diabetes Paternal Grandfather    Brain cancer Maternal Uncle        Unknown if maternal or paternal  uncle   AVM Cousin    Breast cancer Neg Hx    Colon cancer Neg Hx    Heart disease Neg Hx     Social History:  Social History   Socioeconomic History   Marital status: Single     Spouse name: Not on file   Number of children: 3   Years of education: 15   Highest education level: Some college, no degree  Occupational History   Occupation: Peer support specialist  Tobacco Use   Smoking status: Never   Smokeless tobacco: Never  Vaping Use   Vaping Use: Never used  Substance and Sexual Activity   Alcohol use: Not Currently   Drug use: Not Currently    Comment: Hx of cocaine abuse, stopped at age [age]   Sexual activity: Yes    Birth control/protection: Pill  Other Topics Concern   Not on file  Social History Narrative   Right handed    One story home   One cup of coffe per day and one soda   Social Determinants of Health   Financial Resource Strain: Not on file  Food Insecurity: Not on file  Transportation Needs: Not on file  Physical Activity: Not on file  Stress: Not on file  Social Connections: Not on file  Intimate Partner Violence: Not on file    Allergies:  Allergies  Allergen Reactions   Other Rash    PREDNISONE   Penicillin G Hives   Penicillins Hives   Latex Rash   Prednisone Rash    Pt reports rash on upper chest near end of treatment.    Medications: Prior to Admission medications   Medication Sig Start Date End Date Taking? Authorizing Provider  albuterol (VENTOLIN HFA) 108 (90 Base) MCG/ACT inhaler INHALE 1-2 PUFFS INTO THE LUNGS EVERY 6 HOURS AS NEEDED FOR SHORTNESS OF BREATH 03/28/23  Yes Baity, Salvadore Oxford, NP  baclofen (LIORESAL) 10 MG tablet TAKE 1 TABLET BY MOUTH AT BEDTIME AS NEEDED FOR MUSCLE SPASMS 03/28/23  Yes Lorre Munroe, NP  esomeprazole (NEXIUM) 40 MG capsule TAKE 1 CAPSULE BY MOUTH ONCE DAILY AT 12NOON 10/29/21  Yes Pasty Spillers, MD  fluticasone (FLONASE) 50 MCG/ACT nasal spray PLACE 1 SPRAY INTO BOTH NOSTRILS ONCE DAILY 03/28/23  Yes Lorre Munroe, NP  gabapentin (NEURONTIN) 300 MG capsule 1 tab PO QAM 2 tabs PO QPM 03/28/23  Yes Baity, Salvadore Oxford, NP  lamoTRIgine (LAMICTAL) 100 MG tablet Take 100 mg by mouth  every morning. 03/21/23  Yes [provider]  lidocaine (LIDODERM) 5 % Place 1 patch onto the skin daily. Remove & Discard patch within 12 hours or as directed by MD 10/28/22  Yes Lorre Munroe, NP  lisdexamfetamine (VYVANSE) 40 MG capsule Take by mouth. 04/14/22  Yes [provider]  loratadine (CLARITIN) 10 MG tablet Take 10 mg by mouth daily.   Yes [provider]  montelukast (SINGULAIR) 10 MG tablet TAKE 1 TABLET BY MOUTH ONCE DAILY AT BEDTIME 02/17/23  Yes Baity, Salvadore Oxford, NP  simvastatin (ZOCOR) 10 MG tablet Take 1 tablet (10 mg total) by mouth at bedtime. 10/31/22  Yes Lorre Munroe, NP  traZODone (DESYREL) 50 MG tablet SMARTSIG:0.25-1 Tablet(s) By Mouth Every Night PRN 03/14/23  Yes [provider]  Ubrogepant (UBRELVY) 100 MG TABS Take 1 tablet (100 mg total) by mouth as needed (May repeat in 2 hours if needed.  Maximum 2 tablets in 24 hours.). 12/22/22  Yes Lorre Munroe,  NP  Mometasone Furo-Formoterol Fum (DULERA) 50-5 MCG/ACT AERO TAKE 2 PUFFS BY MOUTH TWICE A DAY    [provider]  norethindrone-ethinyl estradiol 1/35 (NORTREL 1/35, 21,) tablet Take 1 tablet by mouth daily. 03/31/23   Tresea Mall, CNM  topiramate (TOPAMAX) 25 MG tablet Take  25mg  at bedtime for one week, then increase to 50mg  at bedtime. 04/16/21 04/28/21  Drema Dallas, DO    Physical Exam Vitals: Blood pressure 112/75, pulse 88, height 5\' 2"  (1.575 m), weight 129 lb (58.5 kg), last menstrual period 03/03/2023.  General: NAD HEENT: normocephalic, anicteric Thyroid: no enlargement, no palpable nodules Pulmonary: No increased work of breathing, CTAB Cardiovascular: RRR, distal pulses 2+ Breast: Breast symmetrical, no tenderness, no palpable nodules or masses, no skin or nipple retraction present, no nipple discharge.  No axillary or supraclavicular lymphadenopathy. Abdomen: NABS, soft, non-tender, non-distended.  Umbilicus without lesions.  No hepatomegaly, splenomegaly  or masses palpable. No evidence of hernia  Genitourinary:  External: Normal external female genitalia.  Normal urethral meatus, normal Bartholin's and Skene's glands.    Vagina: Normal vaginal mucosa, no evidence of prolapse.    Cervix: Grossly normal in appearance, no bleeding  Uterus: Non-enlarged, mobile, normal contour.  No CMT  Adnexa: ovaries non-enlarged, no adnexal masses  Rectal: deferred  Lymphatic: no evidence of inguinal lymphadenopathy Extremities: no edema, erythema, or tenderness Neurologic: Grossly intact Psychiatric: mood appropriate, affect full   Assessment: 37 y.o. [id number] routine annual exam  Plan: Problem List Items Addressed This Visit   None Visit Diagnoses     Well woman exam with routine gynecological exam    -  Primary   Relevant Orders   Cytology - PAP   Screening for cervical cancer       Relevant Orders   Cytology - PAP   Screen for sexually transmitted diseases       Relevant Orders   RPR Qual   Hepatitis B surface antibody,qualitative   Hepatitis C antibody   HIV Antibody (routine testing w rflx)   Cytology - PAP   Need for hepatitis B screening test       Relevant Orders   Hepatitis B surface antibody,qualitative   Need for hepatitis C screening test       Relevant Orders   Hepatitis C antibody   Screening, lipid       Relevant Orders   Lipid Panel With LDL/HDL Ratio   Encounter for female birth control       Relevant Medications   norethindrone-ethinyl estradiol 1/35 (NORTREL 1/35, 21,) tablet       1) STI screening  was offered and accepted  2)  ASCCP guidelines and rationale discussed.  Patient opts for every 5 years screening interval. PAP today  3) Contraception - the patient is currently using  OCP (estrogen/progesterone).  She is happy with her current form of contraception and plans to continue  4) Routine healthcare maintenance including cholesterol, diabetes screening discussed:  Cholesterol screening ordered  today  5) Return in about 1 year (around 03/30/2024) for annual established gyn.   Tresea Mall, CNM [facility] 03/31/2023 1:35 PM

## 2023-04-01 LAB — LIPID PANEL WITH LDL/HDL RATIO
Cholesterol, Total: 166 mg/dL (ref 100–199)
HDL: 50 mg/dL (ref >39)
LDL Chol Calc (NIH): 93 mg/dL (ref 0–99)
LDL/HDL Ratio: 1.9 ratio (ref 0.0–3.2)
Triglycerides: 131 mg/dL (ref 0–149)
VLDL Cholesterol Cal: 23 mg/dL (ref 5–40)

## 2023-04-01 LAB — RPR QUALITATIVE: RPR Ser Ql: NONREACTIVE

## 2023-04-01 LAB — HEPATITIS C ANTIBODY: Hep C Virus Ab: NONREACTIVE

## 2023-04-01 LAB — HIV ANTIBODY (ROUTINE TESTING W REFLEX): HIV Screen 4th Generation wRfx: NONREACTIVE

## 2023-04-01 LAB — HEPATITIS B SURFACE ANTIBODY,QUALITATIVE: Hep B Surface Ab, Qual: NONREACTIVE

## 2023-04-03 LAB — CYTOLOGY - PAP
Chlamydia: NEGATIVE
Comment: NEGATIVE
Comment: NEGATIVE
Comment: NEGATIVE
Comment: NORMAL
Diagnosis: NEGATIVE
High risk HPV: NEGATIVE
Neisseria Gonorrhea: NEGATIVE
Trichomonas: NEGATIVE

## 2023-04-04 DIAGNOSIS — F329 Major depressive disorder, single episode, unspecified: Secondary | ICD-10-CM | POA: Diagnosis not present

## 2023-04-10 ENCOUNTER — Ambulatory Visit
Admission: EM | Admit: 2023-04-10 | Discharge: 2023-04-10 | Disposition: A | Discharge status: Home / Self Care | Payer: BC Managed Care – PPO | Attending: Urgent Care | Admitting: Urgent Care

## 2023-04-10 ENCOUNTER — Encounter: Payer: Self-pay | Admitting: Emergency Medicine

## 2023-04-10 DIAGNOSIS — R21 Rash and other nonspecific skin eruption: Secondary | ICD-10-CM

## 2023-04-10 MED ORDER — FLUCONAZOLE 200 MG PO TABS
200.0000 mg | ORAL_TABLET | ORAL | 0 refills | Status: AC
Start: 2023-04-10 — End: 2023-05-08
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
## 2023-04-10 NOTE — Discharge Instructions (Addendum)
Treating you today for a fungal infection.  Please follow-up with your primary care provider if your symptoms are not resolved with treatment. I have also referred you to dermatology and hoping they will be able to schedule an evaluation soon.

## 2023-04-10 NOTE — ED Triage Notes (Signed)
Pt reports a rash x 2 days. States the rash started on her legs and this morning noticed the rash had spread to her face. Has tried using cortisone cream and an OTC allergy medication.

## 2023-04-11 ENCOUNTER — Encounter: Payer: Self-pay | Admitting: Internal Medicine

## 2023-04-11 ENCOUNTER — Ambulatory Visit: Payer: Self-pay

## 2023-04-11 ENCOUNTER — Ambulatory Visit (INDEPENDENT_AMBULATORY_CARE_PROVIDER_SITE_OTHER): Payer: BC Managed Care – PPO | Admitting: Internal Medicine

## 2023-04-11 VITALS — BP 118/72 | HR 88 | Temp 96.8°F | Wt 128.0 lb

## 2023-04-11 DIAGNOSIS — L255 Unspecified contact dermatitis due to plants, except food: Secondary | ICD-10-CM | POA: Diagnosis not present

## 2023-04-11 MED ORDER — PREDNISONE 10 MG PO TABS: ORAL_TABLET | ORAL | 0 refills | Status: DC

## 2023-04-11 NOTE — Telephone Encounter (Signed)
  Chief Complaint: Rash on legs, left lower lid swollen - vision effected Symptoms: above Frequency: yesterday Pertinent Negatives: Patient denies fever Disposition: [] ED /[] Urgent Care (no appt availability in office) / [x] Appointment(In office/virtual)/ []  [facility]/ [] Home Care/ [] Refused Recommended Disposition /[] [place] [facility]/ []  Follow-up with PCP Additional Notes: Pt started with rash on legs. Rash was also on bridge of nose. Left lower eye lid is swollen, effecting vision.Rash is itchy and burning. Pt was [facility] at [facility] yesterday.  Reason for Disposition  [1] SEVERE eyelid swelling on one side AND [2] sinus pain or pressure  Answer Assessment - Initial Assessment Questions 1. ONSET: "When did the swelling start?" (e.g., minutes, hours, days)     yesterday 2. LOCATION: "What part of the eyelids is swollen?"     Lower  3. SEVERITY: "How swollen is it?"     Under left eye swollen 4. ITCHING: "Is there any itching?" If Yes, ask: "How much?"   (Scale 1-10; mild, moderate or severe)     moderate 5. PAIN: "Is the swelling painful to touch?" If Yes, ask: "How painful is it?"   (Scale 1-10; mild, moderate or severe)     burns 6. FEVER: "Do you have a fever?" If Yes, ask: "What is it, how was it measured, and when did it start?"      no 7. CAUSE: "What do you think is causing the swelling?"     no 8. RECURRENT SYMPTOM: "Have you had eyelid swelling before?" If Yes, ask: "When was the last time?" "What happened that time?"     no 9. OTHER SYMPTOMS: "Do you have any other symptoms?" (e.g., blurred vision, eye discharge, rash, runny nose)     Blurred visiion 10. PREGNANCY: "Is there any chance you are pregnant?" "When was your last menstrual period?"       no  Protocols used: Eye - Swelling-A-AH

## 2023-04-11 NOTE — Patient Instructions (Signed)
Poison Ivy Dermatitis Poison ivy dermatitis is redness and soreness of the skin caused by chemicals in the leaves of the poison ivy plant. You may have very bad itching, swelling, a rash, and blisters. What are the causes? Touching a poison ivy plant. Touching something that has the chemical on it. This may include animals or objects that have come in contact with the plant. What increases the risk? Going outdoors often in wooded or marshy areas. Going outdoors without wearing protective clothing, such as closed shoes, long pants, and a long-sleeved shirt. What are the signs or symptoms?  Skin redness. Very bad itching. A rash that often includes bumps and blisters. The rash usually appears 48 hours after exposure, if you have had it before. If this is the first time you have it, the rash may not appear until a week after exposure. Swelling. This may occur if the reaction is very bad. Symptoms usually last for 1-2 weeks. The first time you get this condition, symptoms may last 3-4 weeks. How is this treated? This condition may be treated with: Hydrocortisone cream or calamine lotion to relieve itching. Oatmeal baths to soothe the skin. Medicines, such as over-the-counter antihistamine tablets. Oral steroid medicine for very bad reactions. Follow these instructions at home: Medicines Take or apply over-the-counter and prescription medicines only as told by your doctor. Use hydrocortisone cream or calamine lotion as needed to help with itching. General instructions Do not scratch or rub your skin. Put a cold, wet cloth (cold compress) on the affected areas or take baths in cool water. This will help with itching. Avoid hot baths and showers. Take oatmeal baths as needed. Use colloidal oatmeal. You can get this at a pharmacy or grocery store. Follow the instructions on the package. While you have the rash, wash your clothes right after you wear them. Check the affected area every day  for signs of infection. Check for: More redness, swelling, or pain. Fluid or blood. Warmth. Pus or a bad smell. Keep all follow-up visits. Your doctor may want to see how your skin is doing with treatment. How is this prevented?  Know what poison ivy looks like, so you can avoid it. This plant has three leaves with flowering branches on a single stem. The leaves are glossy. The leaves have uneven edges that come to a point. If you touch poison ivy, wash your skin with soap and water right away. Be sure to wash under your fingernails. When hiking or camping, wear long pants, a long-sleeved shirt, long socks, and hiking boots. You can also use a lotion on your skin that helps to prevent contact with poison ivy. If you think that your clothes or outdoor gear came in contact with poison ivy, rinse them off with a garden hose before you bring them inside your house. When doing yard work or gardening, wear gloves, long sleeves, long pants, and boots. Wash your garden tools and gloves if they come in contact with poison ivy. If you think that your pet has come into contact with poison ivy, wash them with pet shampoo and water. Make sure to wear gloves while washing your pet. Contact a doctor if: You have open sores in the rash area. You have any signs of infection. You have redness that spreads past the rash area. You have a fever. You have a rash over a large area of your body. You have a rash on your eyes, mouth, or genitals. Your rash does not get better after   a few weeks. Get help right away if: Your face swells or your eyes swell shut. You have trouble breathing. You have trouble swallowing. These symptoms may be an emergency. Do not wait to see if the symptoms will go away. Get help right away. Call 911. This information is not intended to replace advice given to you by your health care provider. Make sure you discuss any questions you have with your health care provider. Document  Revised: 04/07/2022 Document Reviewed: 04/07/2022 Elsevier Patient Education  2023 [employer]  
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
## 2023-04-24 ENCOUNTER — Ambulatory Visit (INDEPENDENT_AMBULATORY_CARE_PROVIDER_SITE_OTHER): Payer: BC Managed Care – PPO | Admitting: Internal Medicine

## 2023-04-24 ENCOUNTER — Encounter: Payer: Self-pay | Admitting: Internal Medicine

## 2023-04-24 ENCOUNTER — Ambulatory Visit: Payer: Self-pay | Admitting: *Deleted

## 2023-04-24 VITALS — BP 102/70 | HR 88 | Temp 97.3°F | Wt 126.0 lb

## 2023-04-24 DIAGNOSIS — R21 Rash and other nonspecific skin eruption: Secondary | ICD-10-CM | POA: Diagnosis not present

## 2023-04-24 DIAGNOSIS — W57XXXA Bitten or stung by nonvenomous insect and other nonvenomous arthropods, initial encounter: Secondary | ICD-10-CM

## 2023-04-24 MED ORDER — METHYLPREDNISOLONE ACETATE 80 MG/ML IJ SUSP
80.0000 mg | Freq: Once | INTRAMUSCULAR | Status: AC
Start: 2023-04-24 — End: 2023-04-24
  Administered 2023-04-24: 80 mg via INTRAMUSCULAR

## 2023-04-24 MED ORDER — MUPIROCIN 2 % EX OINT: 1.0000 | TOPICAL_OINTMENT | Freq: Two times a day (BID) | CUTANEOUS | 0 refills | Status: DC

## 2023-04-24 NOTE — Patient Instructions (Signed)
Insect Bite, Adult An insect bite can make your skin red, itchy, and swollen. Some insects can spread disease to people with a bite. However, most insect bites do not lead to disease, and most are not serious. What are the causes? Insects may bite for many reasons, including: Hunger. To defend themselves. Insects that bite include: Spiders. Mosquitoes. Flies. Ticks and fleas. Ants. Kissing bugs. Chiggers. What are the signs or symptoms? Symptoms often last for 2-4 days. However, itching can last up to 10 days. Symptoms include: Itching or pain in the bite area. Redness and swelling in the bite area. An open wound. In rare cases, a person may have a very bad allergic reaction (anaphylactic reaction) to a bite. Symptoms of an anaphylactic reaction may include: Feeling warm in the face (flushed). Your face may turn red. Itchy, red, swollen areas of skin (hives). Swelling of the eyes, lips, face, mouth, tongue, or throat. Trouble with breathing, talking, or swallowing. High-pitched whistling sounds, most often when breathing out (wheezing). Feeling dizzy or light-headed. Fainting. Pain or cramps in your belly (abdomen). Vomiting. Watery poop (diarrhea). How is this treated? Most insect bites are not serious. Symptoms often go away on their own. When treatment is advised, it may include: Putting ice on the bite area. Putting a cream or lotion, like calamine lotion, on the bite area. This helps with itching. Using medicines called antihistamines. You may also need: A tetanus shot if you are not up to date. An antibiotic cream or medicine. This treatment is needed if the bite area gets infected. Follow these instructions at home: Bite area care  Do not scratch the bite area. It may help to cover the bite area with a bandage or close-fitting clothing. Keep the bite area clean and dry. Check the bite area every day for signs of infection. Check for: More redness, swelling, or  pain. Fluid or blood. Warmth. Pus or a bad smell. Wash your hands often. Managing pain, itching, and swelling  You may put any of these on the bite area as told by your doctor: A paste made of baking soda and water. Cortisone cream. Calamine lotion. If told, put ice on the bite area. To do this: Put ice in a plastic bag. Place a towel between your skin and the bag. Leave the ice on for 20 minutes, 2-3 times a day. If your skin turns bright red, take off the ice right away to prevent skin damage. The risk of skin damage is higher if you cannot feel pain, heat, or cold. General instructions Apply or take over-the-counter and prescription medicines only as told by your doctor. If you were prescribed antibiotics, take or apply them as told by your doctor. Do not stop using them even if you start to feel better. How is this prevented? To help you have a lower risk of insect bites: When you are outside, wear clothes that cover your arms and legs. Use insect repellent. The best insect repellents contain one of these: DEET. Picaridin. Oil of lemon eucalyptus (OLE). IR3535. Consider spraying your clothing with a pesticide called permethrin. Permethrin helps prevent insect bites. It works for several weeks and for up to 5-6 clothing washes. Do not apply permethrin directly to the skin. If your home windows do not have screens, think about putting some in. If you will be sleeping in an area where there are mosquitoes, consider covering your sleeping area with a mosquito net. Contact a doctor if: You have redness, swelling, or pain   in the bite area. You have fluid or blood coming from the bite area. The bite area feels warm to the touch. You have pus or a bad smell coming from the bite area. You have a fever. Get help right away if: You have joint pain. You have a rash. You feel weak or more tired than you normally do. You have neck pain or a headache. You have signs of an anaphylactic  reaction. Signs may include: Swelling of your eyes, lips, face, mouth, tongue, or throat. Feeling warm in the face. Itchy, red, swollen areas of skin. Trouble with breathing, talking, or swallowing. Wheezing. Feeling dizzy or light-headed. Fainting. Pain or cramps in your belly. Vomiting or watery poop. These symptoms may be an emergency. Get help right away. Call 911. Do not wait to see if symptoms will go away. Do not drive yourself to the hospital. Summary An insect bite can make your skin red, itchy, and swollen. Treatment is usually not needed. Symptoms often go away on their own. Do not scratch the bite area. Keep it clean and dry. Use insect repellent to help prevent insect bites. Contact a doctor if you have signs of infection. This information is not intended to replace advice given to you by your health care provider. Make sure you discuss any questions you have with your health care provider. Document Revised: 02/01/2022 Document Reviewed: 02/01/2022 Elsevier Patient Education  2024 [employer]  

## 2023-04-24 NOTE — Addendum Note (Signed)
Addended by: Kavin Leech E on: 04/24/2023 03:43 PM   Modules accepted: Orders
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
## 2023-04-24 NOTE — Telephone Encounter (Signed)
  Chief Complaint: Spider Bite Symptoms: Right, arm right knee. Extending redness after 3 days, 2-3 inches. Warm tot touch. Itchy, mild pain. Chills Frequency: 3 days ago Pertinent Negatives: Patient denies  Disposition: [] ED /[] Urgent Care (no appt availability in office) / [x] Appointment(In office/virtual)/ []  [facility]/ [] Home Care/ [] Refused Recommended Disposition /[] [facility]/ []  Follow-up with PCP Additional Notes: Pt did not see spider. States her mother is in hospital and was told by medical staff there "Probably a spider bite"  and should be seen. Appt secured for today, care advise provided, pt verbalizes understanding.  Reason for Disposition  [1] Red or very tender (to touch) area AND [2] getting larger over 48 hours after the bite  Answer Assessment - Initial Assessment Questions 1. TYPE of SPIDER: "What type of spider was it?"  (e.g., name, unknown, or brief description)     Unsure 2. LOCATION: "Where is the bite located?"      One on right arm . one on right knee cap 3. PAIN: "Is there any pain?" If Yes, ask: "How bad is it?"  (Scale 1-10; or mild, moderate, severe)    - NONE (0): no pain    - MILD (1-3): doesn't interfere with normal activities     - MODERATE (4-7): interferes with normal activities or awakens from sleep     - SEVERE (8-10): excruciating pain, unable to do any normal activities     3/10 4. SWELLING: "How big is the swelling?" (Inches, cm or compare to coins)      Mild 5. ONSET: "When did the bite occur?" (Minutes or hours ago)      3 day ago  7. OTHER SYMPTOMS: "Do you have any other symptoms?"  (e.g., muscle cramps, abdomen pain, change in urine color)     Redness 2-3 inches, warmth,itchy, chills  Protocols used: Spider Bite - [employer]

## 2023-04-27 ENCOUNTER — Encounter: Payer: Self-pay | Admitting: Internal Medicine

## 2023-04-27 ENCOUNTER — Other Ambulatory Visit: Payer: Self-pay | Admitting: Internal Medicine

## 2023-04-27 DIAGNOSIS — R0602 Shortness of breath: Secondary | ICD-10-CM

## 2023-04-27 NOTE — Telephone Encounter (Signed)
Requested Prescriptions  Pending Prescriptions Disp Refills   albuterol (VENTOLIN HFA) 108 (90 Base) MCG/ACT inhaler [Pharmacy Med Name: VENTOLIN HFA 108 (90 BASE) MCG/ACT] 18 g 0    Sig: INHALE 1-2 PUFFS INTO THE LUNGS EVERY 6 HOURS AS NEEDED FOR SHORTNESS OF BREATH     Pulmonology:  Beta Agonists 2 Passed - 04/27/2023 10:35 AM      Passed - Last BP in normal range    BP Readings from Last 1 Encounters:  04/24/23 102/70         Passed - Last Heart Rate in normal range    Pulse Readings from Last 1 Encounters:  04/24/23 88         Passed - Valid encounter within last 12 months    Recent Outpatient Visits           3 days ago Multiple insect bites   [place] [facility] [place], Salvadore Oxford, NP   2 weeks ago Contact dermatitis due to plant   [place] [facility] [place], Salvadore Oxford, NP   6 months ago Encounter for general adult medical examination with abnormal findings   [place] [facility] [place], Salvadore Oxford, NP   7 months ago Mild intermittent asthma without complication   [place] [facility] [place], Salvadore Oxford, NP   1 year ago Cervical radiculopathy   [place] [facility] [place], Salvadore Oxford, NP       Future Appointments             In 2 weeks Sampson Si, Salvadore Oxford, NP [place] [facility], [facility]

## 2023-04-28 MED ORDER — DOXYCYCLINE HYCLATE 100 MG PO TABS: 100.0000 mg | ORAL_TABLET | Freq: Two times a day (BID) | ORAL | 0 refills | Status: DC

## 2023-05-05 ENCOUNTER — Encounter: Payer: Self-pay | Admitting: Advanced Practice Midwife

## 2023-05-12 DIAGNOSIS — F329 Major depressive disorder, single episode, unspecified: Secondary | ICD-10-CM | POA: Diagnosis not present

## 2023-05-13 ENCOUNTER — Other Ambulatory Visit: Payer: Self-pay | Admitting: Internal Medicine

## 2023-05-15 NOTE — Telephone Encounter (Signed)
Requested Prescriptions  Pending Prescriptions Disp Refills   baclofen (LIORESAL) 10 MG tablet [Pharmacy Med Name: BACLOFEN 10 MG TAB] 90 each 0    Sig: TAKE 1 TABLET BY MOUTH AT BEDTIME AS NEEDED FOR MUSCLE SPASMS     Analgesics:  Muscle Relaxants - baclofen Failed - 05/13/2023 11:17 AM      Failed - Cr in normal range and within 180 days    Creat  Date Value Ref Range Status  10/28/2022 0.74 0.50 - 0.97 mg/dL Final         Failed - eGFR is 30 or above and within 180 days    GFR, Est African American  Date Value Ref Range Status  03/16/2020 134 > OR = 60 mL/min/1.64m2 Final   GFR, Est Non African American  Date Value Ref Range Status  03/16/2020 115 > OR = 60 mL/min/1.73m2 Final   eGFR  Date Value Ref Range Status  10/28/2022 109 > OR = 60 mL/min/1.71m2 Final         Passed - Valid encounter within last 6 months    Recent Outpatient Visits           3 weeks ago Multiple insect bites   [place] [facility] [place], Salvadore Oxford, NP   1 month ago Contact dermatitis due to plant   [place] [facility] [place], Salvadore Oxford, NP   6 months ago Encounter for general adult medical examination with abnormal findings   [place] [facility] [place], Salvadore Oxford, NP   7 months ago Mild intermittent asthma without complication   [place] [facility] [place], Salvadore Oxford, NP   1 year ago Cervical radiculopathy   [place] [facility] [place], Salvadore Oxford, NP       Future Appointments             Tomorrow Sampson Si, Salvadore Oxford, NP [place] [facility], [facility]

## 2023-05-16 ENCOUNTER — Ambulatory Visit: Payer: BC Managed Care – PPO | Admitting: Internal Medicine

## 2023-05-16 DIAGNOSIS — G4700 Insomnia, unspecified: Secondary | ICD-10-CM | POA: Insufficient documentation

## 2023-05-16 DIAGNOSIS — M5412 Radiculopathy, cervical region: Secondary | ICD-10-CM | POA: Insufficient documentation

## 2023-05-16 DIAGNOSIS — K219 Gastro-esophageal reflux disease without esophagitis: Secondary | ICD-10-CM | POA: Insufficient documentation

## 2023-05-16 DIAGNOSIS — E785 Hyperlipidemia, unspecified: Secondary | ICD-10-CM | POA: Insufficient documentation
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
## 2023-05-19 ENCOUNTER — Encounter: Payer: Self-pay | Admitting: Internal Medicine

## 2023-05-19 MED ORDER — BACLOFEN 10 MG PO TABS: 10.0000 mg | ORAL_TABLET | Freq: Three times a day (TID) | ORAL | 0 refills | Status: DC

## 2023-05-22 ENCOUNTER — Encounter: Payer: Self-pay | Admitting: Internal Medicine

## 2023-05-23 DIAGNOSIS — F329 Major depressive disorder, single episode, unspecified: Secondary | ICD-10-CM | POA: Diagnosis not present

## 2023-05-27 ENCOUNTER — Other Ambulatory Visit: Payer: Self-pay | Admitting: Internal Medicine

## 2023-05-27 DIAGNOSIS — R0602 Shortness of breath: Secondary | ICD-10-CM

## 2023-05-29 NOTE — Telephone Encounter (Signed)
Lipid panel in date.  Requested Prescriptions  Pending Prescriptions Disp Refills   albuterol (VENTOLIN HFA) 108 (90 Base) MCG/ACT inhaler [Pharmacy Med Name: VENTOLIN HFA 108 (90 BASE) MCG/ACT] 18 g 0    Sig: INHALE 1-2 PUFFS INTO THE LUNGS EVERY 6 HOURS AS NEEDED FOR SHORTNESS OF BREATH     Pulmonology:  Beta Agonists 2 Passed - 05/27/2023  4:19 PM      Passed - Last BP in normal range    BP Readings from Last 1 Encounters:  04/24/23 102/70         Passed - Last Heart Rate in normal range    Pulse Readings from Last 1 Encounters:  04/24/23 88         Passed - Valid encounter within last 12 months    Recent Outpatient Visits           1 month ago Multiple insect bites   [place] [facility] [place], Salvadore Oxford, NP   1 month ago Contact dermatitis due to plant   [place] [facility] [place], Salvadore Oxford, NP   7 months ago Encounter for general adult medical examination with abnormal findings   [place] [facility] [place], [place] W, NP   8 months ago Mild intermittent asthma without complication   [place] [facility] [place], Salvadore Oxford, NP   1 year ago Cervical radiculopathy   [place] [facility] [place], [place] W, NP               fluticasone [facility]) 50 MCG/ACT nasal spray [Pharmacy Med Name: FLUTICASONE PROPIONATE 50 MCG/ACT N] 16 g 1    Sig: PLACE 1 SPRAY INTO BOTH NOSTRILS ONCE DAILY     Ear, Nose, and Throat: Nasal Preparations - Corticosteroids Passed - 05/27/2023  4:19 PM      Passed - Valid encounter within last 12 months    Recent Outpatient Visits           1 month ago Multiple insect bites   [place] [facility] [place], Salvadore Oxford, NP   1 month ago Contact dermatitis due to plant   [place] [facility] [place], Salvadore Oxford, NP   7 months ago Encounter for general adult medical examination with abnormal findings   [place] [facility] [place], [place] W, NP   8 months ago Mild intermittent asthma without complication   [place] [facility] [place], [place] W, NP   1 year ago Cervical radiculopathy   [place] [facility] [place], Salvadore Oxford, NP               simvastatin (ZOCOR) 10 MG tablet [Pharmacy Med Name: SIMVASTATIN 10 MG TAB] 90 tablet 1    Sig: TAKE 1 TABLET BY MOUTH AT BEDTIME     Cardiovascular:  Antilipid - Statins Failed - 05/27/2023  4:19 PM      Failed - Lipid Panel in normal range within the last 12 months    Cholesterol, Total  Date Value Ref Range Status  03/31/2023 166 100 - 199 mg/dL Final   LDL Cholesterol (Calc)  Date Value Ref Range Status  10/28/2022 128 (H) mg/dL (calc) Final    Comment:    Reference range: <100 . Desirable range <100 mg/dL for primary prevention;   <70 mg/dL for patients with CHD or diabetic patients  with > or =  2 CHD risk factors. Marland Kitchen LDL-C is now calculated using the Martin-Hopkins  calculation, which is a validated novel method providing  better accuracy than the Friedewald equation in the  estimation of LDL-C.  Horald Pollen et al. Lenox Ahr. 1610;960(45): 2061-2068  (http://education.[employer].com/faq/FAQ164)    LDL Chol Calc (NIH)  Date Value Ref Range Status  03/31/2023 93 0 - 99 mg/dL Final   HDL  Date Value Ref Range Status  03/31/2023 50 >39 mg/dL Final   Triglycerides  Date Value Ref Range Status  03/31/2023 131 0 - 149 mg/dL Final         Passed - Patient is not pregnant      Passed - Valid encounter within last 12 months    Recent Outpatient Visits           1 month ago Multiple insect bites   [place] [facility] [place], Salvadore Oxford, NP   1 month ago Contact dermatitis due to plant   [place] [facility] [place], Salvadore Oxford, NP   7 months ago Encounter for general adult medical examination with abnormal findings   [place] [facility] [place], Salvadore Oxford,  NP   8 months ago Mild intermittent asthma without complication   [place] [facility] [place], Salvadore Oxford, NP   1 year ago Cervical radiculopathy    [facility] [place], Salvadore Oxford, NP

## 2023-06-06 DIAGNOSIS — G43709 Chronic migraine without aura, not intractable, without status migrainosus: Secondary | ICD-10-CM | POA: Diagnosis not present

## 2023-06-07 DIAGNOSIS — F329 Major depressive disorder, single episode, unspecified: Secondary | ICD-10-CM | POA: Diagnosis not present

## 2023-06-09 ENCOUNTER — Ambulatory Visit (INDEPENDENT_AMBULATORY_CARE_PROVIDER_SITE_OTHER): Payer: BC Managed Care – PPO | Admitting: Internal Medicine

## 2023-06-09 ENCOUNTER — Encounter: Payer: Self-pay | Admitting: Internal Medicine

## 2023-06-09 VITALS — BP 100/68 | HR 95 | Ht 62.0 in | Wt 123.0 lb

## 2023-06-09 DIAGNOSIS — E538 Deficiency of other specified B group vitamins: Secondary | ICD-10-CM | POA: Diagnosis not present

## 2023-06-09 DIAGNOSIS — E7800 Pure hypercholesterolemia, unspecified: Secondary | ICD-10-CM | POA: Diagnosis not present

## 2023-06-09 DIAGNOSIS — F339 Major depressive disorder, recurrent, unspecified: Secondary | ICD-10-CM

## 2023-06-09 DIAGNOSIS — F5105 Insomnia due to other mental disorder: Secondary | ICD-10-CM

## 2023-06-09 DIAGNOSIS — R7309 Other abnormal glucose: Secondary | ICD-10-CM | POA: Diagnosis not present

## 2023-06-09 DIAGNOSIS — F4310 Post-traumatic stress disorder, unspecified: Secondary | ICD-10-CM

## 2023-06-09 DIAGNOSIS — J4530 Mild persistent asthma, uncomplicated: Secondary | ICD-10-CM

## 2023-06-09 DIAGNOSIS — F900 Attention-deficit hyperactivity disorder, predominantly inattentive type: Secondary | ICD-10-CM

## 2023-06-09 DIAGNOSIS — K219 Gastro-esophageal reflux disease without esophagitis: Secondary | ICD-10-CM

## 2023-06-09 DIAGNOSIS — L309 Dermatitis, unspecified: Secondary | ICD-10-CM | POA: Insufficient documentation

## 2023-06-09 DIAGNOSIS — F99 Mental disorder, not otherwise specified: Secondary | ICD-10-CM

## 2023-06-09 DIAGNOSIS — L308 Other specified dermatitis: Secondary | ICD-10-CM | POA: Diagnosis not present

## 2023-06-09 DIAGNOSIS — G43709 Chronic migraine without aura, not intractable, without status migrainosus: Secondary | ICD-10-CM

## 2023-06-09 DIAGNOSIS — A6004 Herpesviral vulvovaginitis: Secondary | ICD-10-CM

## 2023-06-09 DIAGNOSIS — M5412 Radiculopathy, cervical region: Secondary | ICD-10-CM

## 2023-06-09 LAB — CBC
MCH: 31.8 pg (ref 27.0–33.0)
MCV: 94.1 fL (ref 80.0–100.0)
MPV: 10.1 fL (ref 7.5–12.5)
RDW: 12 % (ref 11.0–15.0)

## 2023-06-09 MED ORDER — TRIAMCINOLONE ACETONIDE 0.1 % EX CREA: 1.0000 | TOPICAL_CREAM | Freq: Two times a day (BID) | CUTANEOUS | 0 refills | Status: DC

## 2023-06-09 MED ORDER — ESOMEPRAZOLE MAGNESIUM 40 MG PO CPDR: 40.0000 mg | DELAYED_RELEASE_CAPSULE | Freq: Every day | ORAL | 1 refills | Status: DC

## 2023-06-09 NOTE — Assessment & Plan Note (Signed)
RX for triamcinolone cream provided today

## 2023-06-09 NOTE — Assessment & Plan Note (Signed)
CMET and lipid profile today Encouraged her to consume a low fat diet 

## 2023-06-09 NOTE — Assessment & Plan Note (Signed)
Weaning off baclofen and gabapentin per her neurologist Continue to follow with neurosurgery

## 2023-06-09 NOTE — Assessment & Plan Note (Signed)
She will discuss with psychiatry about switching back to adderall from vyvanse

## 2023-06-09 NOTE — Assessment & Plan Note (Signed)
Continue [name] as prescribed Neurology is trying to get ajovy approved Continue to follow with neurology

## 2023-06-09 NOTE — Assessment & Plan Note (Signed)
Not medicated Will monitor 

## 2023-06-09 NOTE — Assessment & Plan Note (Signed)
Continue trazadone as prescribed by psychiatry
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
## 2023-06-09 NOTE — Patient Instructions (Signed)

## 2023-06-09 NOTE — Assessment & Plan Note (Signed)
Avoid foods that trigger your reflux Continue esomeprazole, refilled today

## 2023-06-09 NOTE — Assessment & Plan Note (Signed)
Continue montelukast, dulera and albuterol

## 2023-06-09 NOTE — Assessment & Plan Note (Signed)
Stable on lamotrigine Support offered She will continue to follow with psychiatry

## 2023-06-10 LAB — CBC
HCT: 38.5 % (ref 35.0–45.0)
Hemoglobin: 13 g/dL (ref 11.7–15.5)
MCHC: 33.8 g/dL (ref 32.0–36.0)
Platelets: 334 10*3/uL (ref 140–400)
RBC: 4.09 10*6/uL (ref 3.80–5.10)
WBC: 8.5 10*3/uL (ref 3.8–10.8)

## 2023-06-10 LAB — COMPLETE METABOLIC PANEL WITH GFR
AG Ratio: 1.9 (calc) (ref 1.0–2.5)
ALT: 14 U/L (ref 6–29)
AST: 13 U/L (ref 10–30)
Albumin: 4.5 g/dL (ref 3.6–5.1)
Alkaline phosphatase (APISO): 52 U/L (ref 31–125)
BUN: 14 mg/dL (ref 7–25)
CO2: 28 mmol/L (ref 20–32)
Calcium: 9.8 mg/dL (ref 8.6–10.2)
Chloride: 103 mmol/L (ref 98–110)
Creat: 0.66 mg/dL (ref 0.50–0.97)
Globulin: 2.4 g/dL (calc) (ref 1.9–3.7)
Glucose, Bld: 92 mg/dL (ref 65–99)
Potassium: 4.3 mmol/L (ref 3.5–5.3)
Sodium: 139 mmol/L (ref 135–146)
Total Bilirubin: 0.8 mg/dL (ref 0.2–1.2)
Total Protein: 6.9 g/dL (ref 6.1–8.1)
eGFR: 117 mL/min/{1.73_m2} (ref >=60)

## 2023-06-10 LAB — VITAMIN B12: Vitamin B-12: 455 pg/mL (ref 200–1100)

## 2023-06-10 LAB — LIPID PANEL
Cholesterol: 209 mg/dL — ABNORMAL HIGH (ref <200)
HDL: 60 mg/dL (ref >=50)
LDL Cholesterol (Calc): 123 mg/dL (calc) — ABNORMAL HIGH
Non-HDL Cholesterol (Calc): 149 mg/dL (calc) — ABNORMAL HIGH (ref <130)
Total CHOL/HDL Ratio: 3.5 (calc) (ref <5.0)
Triglycerides: 148 mg/dL (ref <150)

## 2023-06-10 LAB — HEMOGLOBIN A1C
Hgb A1c MFr Bld: 5 % of total Hgb (ref <5.7)
Mean Plasma Glucose: 97 mg/dL
eAG (mmol/L): 5.4 mmol/L

## 2023-06-20 DIAGNOSIS — F329 Major depressive disorder, single episode, unspecified: Secondary | ICD-10-CM | POA: Diagnosis not present

## 2023-06-27 ENCOUNTER — Other Ambulatory Visit: Payer: Self-pay | Admitting: Internal Medicine

## 2023-06-27 DIAGNOSIS — R0602 Shortness of breath: Secondary | ICD-10-CM

## 2023-06-28 NOTE — Telephone Encounter (Signed)
Requested Prescriptions  Pending Prescriptions Disp Refills   albuterol (VENTOLIN HFA) 108 (90 Base) MCG/ACT inhaler [Pharmacy Med Name: VENTOLIN HFA 108 (90 BASE) MCG/ACT] 18 g 0    Sig: INHALE 1-2 PUFFS INTO THE LUNGS EVERY 6 HOURS AS NEEDED FOR SHORTNESS OF BREATH     Pulmonology:  Beta Agonists 2 Passed - 06/27/2023  1:42 PM      Passed - Last BP in normal range    BP Readings from Last 1 Encounters:  06/09/23 100/68         Passed - Last Heart Rate in normal range    Pulse Readings from Last 1 Encounters:  06/09/23 95         Passed - Valid encounter within last 12 months    Recent Outpatient Visits           2 weeks ago Pure hypercholesterolemia   [place] [facility] [place], Salvadore Oxford, NP   2 months ago Multiple insect bites   [place] [facility] [place], Salvadore Oxford, NP   2 months ago Contact dermatitis due to plant   [place] [facility] [place], Salvadore Oxford, NP   8 months ago Encounter for general adult medical examination with abnormal findings   [place] [facility] [place], Salvadore Oxford, NP   9 months ago Mild intermittent asthma without complication   [place] [facility] [place], Salvadore Oxford, NP       Future Appointments             In 4 months Ba[place], Salvadore Oxford, NP [place] [facility], [facility]

## 2023-07-04 DIAGNOSIS — F329 Major depressive disorder, single episode, unspecified: Secondary | ICD-10-CM | POA: Diagnosis not present

## 2023-07-20 ENCOUNTER — Other Ambulatory Visit: Payer: Self-pay | Admitting: Internal Medicine

## 2023-07-20 DIAGNOSIS — R0602 Shortness of breath: Secondary | ICD-10-CM

## 2023-07-21 NOTE — Telephone Encounter (Signed)
Requested Prescriptions  Pending Prescriptions Disp Refills   montelukast (SINGULAIR) 10 MG tablet [Pharmacy Med Name: MONTELUKAST SODIUM 10 MG TAB] 90 tablet 1    Sig: TAKE 1 TABLET BY MOUTH ONCE DAILY AT BEDTIME     Pulmonology:  Leukotriene Inhibitors Passed - 07/20/2023 10:11 AM      Passed - Valid encounter within last 12 months    Recent Outpatient Visits           1 month ago Pure hypercholesterolemia   [place] [facility] [place], Salvadore Oxford, NP   2 months ago Multiple insect bites   [place] [facility] [place], Salvadore Oxford, NP   3 months ago Contact dermatitis due to plant   [place] [facility] [place], Salvadore Oxford, NP   8 months ago Encounter for general adult medical examination with abnormal findings   [place] [facility] [place], Salvadore Oxford, NP   10 months ago Mild intermittent asthma without complication   [place] [facility] [place], Salvadore Oxford, NP       Future Appointments             In 3 months Baity, Salvadore Oxford, NP [place] [facility], [facility]

## 2023-08-01 DIAGNOSIS — F329 Major depressive disorder, single episode, unspecified: Secondary | ICD-10-CM | POA: Diagnosis not present

## 2023-08-08 DIAGNOSIS — R202 Paresthesia of skin: Secondary | ICD-10-CM | POA: Diagnosis not present

## 2023-08-08 DIAGNOSIS — R6889 Other general symptoms and signs: Secondary | ICD-10-CM | POA: Diagnosis not present

## 2023-08-08 DIAGNOSIS — G43709 Chronic migraine without aura, not intractable, without status migrainosus: Secondary | ICD-10-CM | POA: Diagnosis not present

## 2023-08-08 DIAGNOSIS — R200 Anesthesia of skin: Secondary | ICD-10-CM | POA: Diagnosis not present

## 2023-08-08 DIAGNOSIS — H538 Other visual disturbances: Secondary | ICD-10-CM | POA: Diagnosis not present

## 2023-08-08 DIAGNOSIS — R42 Dizziness and giddiness: Secondary | ICD-10-CM | POA: Diagnosis not present

## 2023-08-08 DIAGNOSIS — R52 Pain, unspecified: Secondary | ICD-10-CM | POA: Diagnosis not present

## 2023-08-08 DIAGNOSIS — R519 Headache, unspecified: Secondary | ICD-10-CM | POA: Diagnosis not present

## 2023-08-08 DIAGNOSIS — G43719 Chronic migraine without aura, intractable, without status migrainosus: Secondary | ICD-10-CM | POA: Diagnosis not present

## 2023-08-15 DIAGNOSIS — F329 Major depressive disorder, single episode, unspecified: Secondary | ICD-10-CM | POA: Diagnosis not present

## 2023-08-18 ENCOUNTER — Other Ambulatory Visit: Payer: Self-pay | Admitting: Internal Medicine

## 2023-08-18 NOTE — Telephone Encounter (Signed)
Requested Prescriptions  Pending Prescriptions Disp Refills   baclofen (LIORESAL) 10 MG tablet [Pharmacy Med Name: BACLOFEN 10 MG TAB] 90 each 0    Sig: TAKE 1 TABLET BY MOUTH AT BEDTIME AS NEEDED FOR MUSCLE SPASMS     Analgesics:  Muscle Relaxants - baclofen Passed - 08/18/2023 11:34 AM      Passed - Cr in normal range and within 180 days    Creat  Date Value Ref Range Status  06/09/2023 0.66 0.50 - 0.97 mg/dL Final         Passed - eGFR is 30 or above and within 180 days    GFR, Est African American  Date Value Ref Range Status  03/16/2020 134 > OR = 60 mL/min/1.89m2 Final   GFR, Est Non African American  Date Value Ref Range Status  03/16/2020 115 > OR = 60 mL/min/1.10m2 Final   eGFR  Date Value Ref Range Status  06/09/2023 117 > OR = 60 mL/min/1.19m2 Final         Passed - Valid encounter within last 6 months    Recent Outpatient Visits           2 months ago Pure hypercholesterolemia   [place] [facility] [place], Salvadore Oxford, NP   3 months ago Multiple insect bites   [place] [facility] [place], Salvadore Oxford, NP   4 months ago Contact dermatitis due to plant   [place] [facility] [place], Salvadore Oxford, NP   9 months ago Encounter for general adult medical examination with abnormal findings   [place] [facility] [place], Salvadore Oxford, NP   10 months ago Mild intermittent asthma without complication   [place] [facility] [place], Salvadore Oxford, NP       Future Appointments             In 2 months Baity, Salvadore Oxford, NP [facility], [facility]

## 2023-08-29 DIAGNOSIS — F329 Major depressive disorder, single episode, unspecified: Secondary | ICD-10-CM | POA: Diagnosis not present

## 2023-09-01 DIAGNOSIS — R202 Paresthesia of skin: Secondary | ICD-10-CM | POA: Diagnosis not present

## 2023-09-01 DIAGNOSIS — H538 Other visual disturbances: Secondary | ICD-10-CM | POA: Diagnosis not present

## 2023-09-01 DIAGNOSIS — R42 Dizziness and giddiness: Secondary | ICD-10-CM | POA: Diagnosis not present

## 2023-09-01 DIAGNOSIS — R519 Headache, unspecified: Secondary | ICD-10-CM | POA: Diagnosis not present

## 2023-09-01 DIAGNOSIS — R200 Anesthesia of skin: Secondary | ICD-10-CM | POA: Diagnosis not present

## 2023-09-01 DIAGNOSIS — R6889 Other general symptoms and signs: Secondary | ICD-10-CM | POA: Diagnosis not present

## 2023-09-12 DIAGNOSIS — F329 Major depressive disorder, single episode, unspecified: Secondary | ICD-10-CM | POA: Diagnosis not present

## 2023-09-14 ENCOUNTER — Other Ambulatory Visit: Payer: Self-pay | Admitting: Internal Medicine

## 2023-09-15 NOTE — Telephone Encounter (Signed)
Requested Prescriptions  Pending Prescriptions Disp Refills   fluticasone (FLONASE) 50 MCG/ACT nasal spray [Pharmacy Med Name: FLUTICASONE PROPIONATE 50 MCG/ACT N] 16 g 1    Sig: PLACE 1 SPRAY INTO BOTH NOSTRILS ONCE DAILY     Ear, Nose, and Throat: Nasal Preparations - Corticosteroids Passed - 09/14/2023  4:11 PM      Passed - Valid encounter within last 12 months    Recent Outpatient Visits           3 months ago Pure hypercholesterolemia   [place] [facility] [place], Salvadore Oxford, NP   4 months ago Multiple insect bites   [place] [facility] [place], Salvadore Oxford, NP   5 months ago Contact dermatitis due to plant   [place] [facility] [place], Salvadore Oxford, NP   10 months ago Encounter for general adult medical examination with abnormal findings   [place] [facility] [place], Salvadore Oxford, NP   11 months ago Mild intermittent asthma without complication   [place] [facility] [place], Salvadore Oxford, NP       Future Appointments             In 1 month [place], Salvadore Oxford, NP [place] [facility], [facility]

## 2023-09-21 ENCOUNTER — Ambulatory Visit: Payer: BC Managed Care – PPO

## 2023-09-21 DIAGNOSIS — F329 Major depressive disorder, single episode, unspecified: Secondary | ICD-10-CM | POA: Diagnosis not present

## 2023-09-21 DIAGNOSIS — F900 Attention-deficit hyperactivity disorder, predominantly inattentive type: Secondary | ICD-10-CM | POA: Diagnosis not present

## 2023-09-21 DIAGNOSIS — F4312 Post-traumatic stress disorder, chronic: Secondary | ICD-10-CM | POA: Diagnosis not present

## 2023-09-25 DIAGNOSIS — F329 Major depressive disorder, single episode, unspecified: Secondary | ICD-10-CM | POA: Diagnosis not present

## 2023-10-10 DIAGNOSIS — F329 Major depressive disorder, single episode, unspecified: Secondary | ICD-10-CM | POA: Diagnosis not present

## 2023-10-11 ENCOUNTER — Other Ambulatory Visit: Payer: Self-pay | Admitting: Gastroenterology

## 2023-10-11 DIAGNOSIS — R0602 Shortness of breath: Secondary | ICD-10-CM

## 2023-10-12 ENCOUNTER — Other Ambulatory Visit: Payer: Self-pay

## 2023-10-12 DIAGNOSIS — R0602 Shortness of breath: Secondary | ICD-10-CM

## 2023-10-12 MED ORDER — ALBUTEROL SULFATE HFA 108 (90 BASE) MCG/ACT IN AERS
INHALATION_SPRAY | RESPIRATORY_TRACT | 1 refills | Status: DC
Start: 2023-10-12 — End: 2023-11-09

## 2023-10-24 ENCOUNTER — Encounter: Payer: Self-pay | Admitting: Internal Medicine

## 2023-10-24 ENCOUNTER — Ambulatory Visit (INDEPENDENT_AMBULATORY_CARE_PROVIDER_SITE_OTHER): Payer: BC Managed Care – PPO | Admitting: Internal Medicine

## 2023-10-24 VITALS — BP 102/68 | HR 103 | Ht 62.0 in | Wt 121.0 lb

## 2023-10-24 DIAGNOSIS — J3089 Other allergic rhinitis: Secondary | ICD-10-CM

## 2023-10-24 DIAGNOSIS — J00 Acute nasopharyngitis [common cold]: Secondary | ICD-10-CM | POA: Diagnosis not present

## 2023-10-24 MED ORDER — BENZONATATE 100 MG PO CAPS: 100.0000 mg | ORAL_CAPSULE | Freq: Three times a day (TID) | ORAL | 0 refills | Status: DC | PRN

## 2023-10-24 MED ORDER — AZITHROMYCIN 250 MG PO TABS: ORAL_TABLET | ORAL | 0 refills | Status: DC
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
## 2023-11-03 ENCOUNTER — Encounter: Payer: BC Managed Care – PPO | Admitting: Internal Medicine

## 2023-11-08 ENCOUNTER — Other Ambulatory Visit: Payer: Self-pay | Admitting: Internal Medicine

## 2023-11-08 DIAGNOSIS — R0602 Shortness of breath: Secondary | ICD-10-CM

## 2023-11-09 ENCOUNTER — Other Ambulatory Visit: Payer: Self-pay | Admitting: Internal Medicine

## 2023-11-09 NOTE — Telephone Encounter (Signed)
Requested Prescriptions  Pending Prescriptions Disp Refills   albuterol (VENTOLIN HFA) 108 (90 Base) MCG/ACT inhaler [Pharmacy Med Name: VENTOLIN HFA 108 (90 BASE) MCG/ACT] 18 g 1    Sig: INHALE 1-2 PUFFS INTO THE LUNGS EVERY 6 HOURS AS NEEDED FOR SHORTNESS OF BREATH     Pulmonology:  Beta Agonists 2 Passed - 11/09/2023  8:18 AM      Passed - Last BP in normal range    BP Readings from Last 1 Encounters:  10/24/23 102/68         Passed - Last Heart Rate in normal range    Pulse Readings from Last 1 Encounters:  10/24/23 (!) 103         Passed - Valid encounter within last 12 months    Recent Outpatient Visits           2 weeks ago Acute nasopharyngitis   [place] [facility] [place], Salvadore Oxford, NP   5 months ago Pure hypercholesterolemia   [place] [facility] [place], Salvadore Oxford, NP   6 months ago Multiple insect bites   [place] [facility] [place], Salvadore Oxford, NP   7 months ago Contact dermatitis due to plant   [place] [facility] [place], Salvadore Oxford, NP   1 year ago Encounter for general adult medical examination with abnormal findings   [place] [facility] [place], Salvadore Oxford, NP       Future Appointments             Tomorrow Sampson Si, Salvadore Oxford, NP [place] [facility]

## 2023-11-09 NOTE — Telephone Encounter (Signed)
Requested Prescriptions  Pending Prescriptions Disp Refills   simvastatin (ZOCOR) 10 MG tablet [Pharmacy Med Name: SIMVASTATIN 10 MG TAB] 90 tablet 1    Sig: TAKE 1 TABLET BY MOUTH AT BEDTIME     Cardiovascular:  Antilipid - Statins Failed - 11/09/2023 12:01 PM      Failed - Lipid Panel in normal range within the last 12 months    Cholesterol, Total  Date Value Ref Range Status  03/31/2023 166 100 - 199 mg/dL Final   Cholesterol  Date Value Ref Range Status  06/09/2023 209 (H) <200 mg/dL Final   LDL Cholesterol (Calc)  Date Value Ref Range Status  06/09/2023 123 (H) mg/dL (calc) Final    Comment:    Reference range: <100 . Desirable range <100 mg/dL for primary prevention;   <70 mg/dL for patients with CHD or diabetic patients  with > or = 2 CHD risk factors. Marland Kitchen LDL-C is now calculated using the Martin-Hopkins  calculation, which is a validated novel method providing  better accuracy than the Friedewald equation in the  estimation of LDL-C.  Horald Pollen et al. Lenox Ahr. 1610;960(45): 2061-2068  (http://education.[facility].com/faq/FAQ164)    HDL  Date Value Ref Range Status  06/09/2023 60 > OR = 50 mg/dL Final  40/98/1191 50 >47 mg/dL Final   Triglycerides  Date Value Ref Range Status  06/09/2023 148 <150 mg/dL Final         Passed - Patient is not pregnant      Passed - Valid encounter within last 12 months    Recent Outpatient Visits           2 weeks ago Acute nasopharyngitis   [place] [facility] [place], Salvadore Oxford, NP   5 months ago Pure hypercholesterolemia   [place] [facility] [place], Salvadore Oxford, NP   6 months ago Multiple insect bites   [place] [facility] [place], Salvadore Oxford, NP   7 months ago Contact dermatitis due to plant   [place] [facility] [place], Salvadore Oxford, NP   1 year ago Encounter for general adult medical examination with abnormal findings   [place] [facility] [place], Salvadore Oxford, NP       Future Appointments             Tomorrow Sampson Si, Salvadore Oxford, NP [facility]             fluticasone [facility]) 50 MCG/ACT nasal spray [Pharmacy Med Name: FLUTICASONE PROPIONATE 50 MCG/ACT N] 16 g 2    Sig: PLACE 1 SPRAY INTO BOTH NOSTRILS ONCE DAILY     Ear, Nose, and Throat: Nasal Preparations - Corticosteroids Passed - 11/09/2023 12:01 PM      Passed - Valid encounter within last 12 months    Recent Outpatient Visits           2 weeks ago Acute nasopharyngitis   [place] [facility] [place], Salvadore Oxford, NP   5 months ago Pure hypercholesterolemia   [place] [facility] [place], Salvadore Oxford, NP   6 months ago Multiple insect bites   [place] [facility] [place], Salvadore Oxford, NP   7 months ago Contact dermatitis due to plant   [place] [facility] [place], Salvadore Oxford, NP   1 year ago Encounter for general adult medical examination with abnormal findings   [place]  [facility] [place], Salvadore Oxford, NP       Future Appointments             Tomorrow Sampson Si, Salvadore Oxford, NP [facility]             esomeprazole (NEXIUM) 40 MG capsule [Pharmacy Med Name: ESOMEPRAZOLE MAGNESIUM 40 MG CAP] 90 capsule 1    Sig: TAKE 1 CAPSULE BY MOUTH ONCE DAILY     Gastroenterology: Proton Pump Inhibitors 2 Passed - 11/09/2023 12:01 PM      Passed - ALT in normal range and within 360 days    ALT  Date Value Ref Range Status  06/09/2023 14 6 - 29 U/L Final         Passed - AST in normal range and within 360 days    AST  Date Value Ref Range Status  06/09/2023 13 10 - 30 U/L Final         Passed - Valid encounter within last 12 months    Recent Outpatient Visits           2 weeks ago Acute nasopharyngitis   [place] [facility] [place], Salvadore Oxford, NP   5 months ago Pure hypercholesterolemia    [place] [facility] [place], Salvadore Oxford, NP   6 months ago Multiple insect bites   [place] [facility] [place], Salvadore Oxford, NP   7 months ago Contact dermatitis due to plant   [place] [facility] [place], Salvadore Oxford, NP   1 year ago Encounter for general adult medical examination with abnormal findings   [place] [facility] [place], Salvadore Oxford, NP       Future Appointments             Tomorrow Sampson Si, Salvadore Oxford, NP  [facility]

## 2023-11-10 ENCOUNTER — Encounter: Payer: BC Managed Care – PPO | Admitting: Internal Medicine
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
## 2023-11-22 ENCOUNTER — Other Ambulatory Visit: Payer: Self-pay | Admitting: Medical Genetics

## 2023-11-24 ENCOUNTER — Encounter: Payer: Self-pay | Admitting: Internal Medicine

## 2023-11-24 ENCOUNTER — Encounter: Payer: BC Managed Care – PPO | Admitting: Internal Medicine
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
## 2023-12-07 DIAGNOSIS — F4312 Post-traumatic stress disorder, chronic: Secondary | ICD-10-CM | POA: Diagnosis not present

## 2023-12-07 DIAGNOSIS — F329 Major depressive disorder, single episode, unspecified: Secondary | ICD-10-CM | POA: Diagnosis not present

## 2023-12-07 DIAGNOSIS — F900 Attention-deficit hyperactivity disorder, predominantly inattentive type: Secondary | ICD-10-CM | POA: Diagnosis not present

## 2023-12-11 ENCOUNTER — Encounter: Payer: Self-pay | Admitting: Internal Medicine

## 2023-12-11 ENCOUNTER — Ambulatory Visit (INDEPENDENT_AMBULATORY_CARE_PROVIDER_SITE_OTHER): Payer: BC Managed Care – PPO | Admitting: Internal Medicine

## 2023-12-11 VITALS — BP 108/64 | Ht 62.0 in | Wt 127.2 lb

## 2023-12-11 DIAGNOSIS — Z0001 Encounter for general adult medical examination with abnormal findings: Secondary | ICD-10-CM

## 2023-12-11 DIAGNOSIS — E7800 Pure hypercholesterolemia, unspecified: Secondary | ICD-10-CM | POA: Diagnosis not present

## 2023-12-11 DIAGNOSIS — Z23 Encounter for immunization: Secondary | ICD-10-CM
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
## 2023-12-11 NOTE — Patient Instructions (Signed)

## 2023-12-19 DIAGNOSIS — F329 Major depressive disorder, single episode, unspecified: Secondary | ICD-10-CM | POA: Diagnosis not present

## 2023-12-19 DIAGNOSIS — M797 Fibromyalgia: Secondary | ICD-10-CM | POA: Diagnosis not present

## 2023-12-19 DIAGNOSIS — G43719 Chronic migraine without aura, intractable, without status migrainosus: Secondary | ICD-10-CM | POA: Diagnosis not present

## 2024-01-08 ENCOUNTER — Other Ambulatory Visit: Payer: Self-pay | Admitting: Internal Medicine

## 2024-01-08 DIAGNOSIS — R0602 Shortness of breath: Secondary | ICD-10-CM

## 2024-01-08 NOTE — Progress Notes (Addendum)
 PA started   [facility] (Key: [id number]) Need Help? Call us at [phone] Status New (Not sent to plan) Drug Bernita Raisin 100MG  tablets ePA cloud logo Form [facility] [place] Commercial Electronic Request Form   Prior Authorization Not Required

## 2024-01-09 NOTE — Telephone Encounter (Signed)
 Requested Prescriptions  Pending Prescriptions Disp Refills   albuterol (VENTOLIN HFA) 108 (90 Base) MCG/ACT inhaler [Pharmacy Med Name: VENTOLIN HFA 108 (90 BASE) MCG/ACT] 18 g 1    Sig: INHALE 1-2 PUFFS INTO THE LUNGS EVERY 6 HOURS AS NEEDED FOR SHORTNESS OF BREATH     Pulmonology:  Beta Agonists 2 Passed - 01/09/2024  1:45 PM      Passed - Last BP in normal range    BP Readings from Last 1 Encounters:  12/11/23 108/64         Passed - Last Heart Rate in normal range    Pulse Readings from Last 1 Encounters:  10/24/23 (!) 103         Passed - Valid encounter within last 12 months    Recent Outpatient Visits           4 weeks ago Encounter for general adult medical examination with abnormal findings   [place] [facility] [place], Salvadore Oxford, NP   2 months ago Acute nasopharyngitis   [place] [facility] [place], Salvadore Oxford, NP   7 months ago Pure hypercholesterolemia   [place] [facility] [place], Salvadore Oxford, NP   8 months ago Multiple insect bites   [place] [facility] [place], Salvadore Oxford, NP   9 months ago Contact dermatitis due to plant   [place] [facility] [place], Salvadore Oxford, NP       Future Appointments             In 5 months Baity, Salvadore Oxford, NP [facility]             montelukast (SINGULAIR) 10 MG tablet [Pharmacy Med Name: MONTELUKAST SODIUM 10 MG TAB] 90 tablet 1    Sig: TAKE 1 TABLET BY MOUTH ONCE DAILY AT BEDTIME     Pulmonology:  Leukotriene Inhibitors Passed - 01/09/2024  1:45 PM      Passed - Valid encounter within last 12 months    Recent Outpatient Visits           4 weeks ago Encounter for general adult medical examination with abnormal findings   [place] [facility] [place], Salvadore Oxford, NP   2 months ago Acute nasopharyngitis   [place] [facility] [place], Salvadore Oxford, NP   7 months ago Pure  hypercholesterolemia   [place] [facility] [place], Salvadore Oxford, NP   8 months ago Multiple insect bites   [place] [facility] [place], Salvadore Oxford, NP   9 months ago Contact dermatitis due to plant   [place] [facility] [place], Salvadore Oxford, NP       Future Appointments             In 5 months Baity, Salvadore Oxford, NP  [place] [facility]             gabapentin (NEURONTIN) 300 MG capsule [Pharmacy Med Name: GABAPENTIN 300 MG CAP] 270 capsule 0    Sig: TAKE 1 CAPSULE BY MOUTH ONCE EVERY MORNING AND TAKE 2 CAPSULES ONCE EVERY EVENING     Neurology: Anticonvulsants - gabapentin Passed - 01/09/2024  1:45 PM      Passed - Cr in normal range and within 360 days    Creat  Date Value Ref Range Status  06/09/2023 0.66 0.50 - 0.97  mg/dL Final         Passed - Completed PHQ-2 or PHQ-9 in the last 360 days      Passed - Valid encounter within last 12 months    Recent Outpatient Visits           4 weeks ago Encounter for general adult medical examination with abnormal findings   [place] [facility] [place], Salvadore Oxford, NP   2 months ago Acute nasopharyngitis   [place] [facility] [place], Salvadore Oxford, NP   7 months ago Pure hypercholesterolemia   [place] [place], Salvadore Oxford, NP   8 months ago Multiple insect bites   [place] [facility] [place], Salvadore Oxford, NP   9 months ago Contact dermatitis due to plant   [facility] [place], Salvadore Oxford, NP       Future Appointments             In 5 months Baity, Salvadore Oxford, NP [place] [facility], [facility]

## 2024-01-30 DIAGNOSIS — F329 Major depressive disorder, single episode, unspecified: Secondary | ICD-10-CM | POA: Diagnosis not present

## 2024-02-02 DIAGNOSIS — M797 Fibromyalgia: Secondary | ICD-10-CM | POA: Diagnosis not present

## 2024-02-02 DIAGNOSIS — G894 Chronic pain syndrome: Secondary | ICD-10-CM | POA: Diagnosis not present

## 2024-02-13 DIAGNOSIS — F329 Major depressive disorder, single episode, unspecified: Secondary | ICD-10-CM | POA: Diagnosis not present

## 2024-02-27 DIAGNOSIS — F329 Major depressive disorder, single episode, unspecified: Secondary | ICD-10-CM | POA: Diagnosis not present

## 2024-03-01 ENCOUNTER — Other Ambulatory Visit: Payer: Self-pay

## 2024-03-06 ENCOUNTER — Other Ambulatory Visit: Payer: Self-pay | Admitting: Internal Medicine

## 2024-03-07 NOTE — Telephone Encounter (Signed)
 Requested Prescriptions  Pending Prescriptions Disp Refills   fluticasone (FLONASE) 50 MCG/ACT nasal spray [Pharmacy Med Name: FLUTICASONE PROPIONATE 50 MCG/ACT N] 16 g 2    Sig: PLACE 1 SPRAY INTO BOTH NOSTRILS ONCE DAILY     Ear, Nose, and Throat: Nasal Preparations - Corticosteroids Failed - 03/07/2024  1:08 PM      Failed - Valid encounter within last 12 months    Recent Outpatient Visits   None     Future Appointments             In 3 months Baity, Rankin Buzzard, NP [place] [facility], [facility]

## 2024-03-19 DIAGNOSIS — F329 Major depressive disorder, single episode, unspecified: Secondary | ICD-10-CM | POA: Diagnosis not present

## 2024-04-05 ENCOUNTER — Other Ambulatory Visit: Payer: Self-pay | Admitting: Advanced Practice Midwife

## 2024-04-05 ENCOUNTER — Other Ambulatory Visit: Payer: Self-pay | Admitting: Internal Medicine

## 2024-04-05 DIAGNOSIS — Z30019 Encounter for initial prescription of contraceptives, unspecified: Secondary | ICD-10-CM

## 2024-04-05 DIAGNOSIS — R0602 Shortness of breath: Secondary | ICD-10-CM

## 2024-04-08 ENCOUNTER — Other Ambulatory Visit: Payer: Self-pay

## 2024-04-08 DIAGNOSIS — Z30019 Encounter for initial prescription of contraceptives, unspecified: Secondary | ICD-10-CM

## 2024-04-08 MED ORDER — NORTREL 1/35 (21) 1-35 MG-MCG PO TABS: 1.0000 | ORAL_TABLET | Freq: Every day | ORAL | 0 refills | Status: DC

## 2024-04-08 NOTE — Progress Notes (Signed)
 Appointment with Angelita Kendall CNM 04/23/24.  One refill of birth control authorized to pharmacy.  Liane Redman RN

## 2024-04-09 NOTE — Telephone Encounter (Signed)
 Too soon for refill, refilled 01/09/24 for 18 g and 1 refill.  Requested Prescriptions  Pending Prescriptions Disp Refills   albuterol  (VENTOLIN  HFA) 108 (90 Base) MCG/ACT inhaler [Pharmacy Med Name: VENTOLIN  HFA 108 (90 BASE) MCG/ACT] 18 g 1    Sig: INHALE 1-2 PUFFS INTO THE LUNGS EVERY 6 HOURS AS NEEDED FOR SHORTNESS OF BREATH     Pulmonology:  Beta Agonists 2 Failed - 04/09/2024  8:34 AM      Failed - Valid encounter within last 12 months    Recent Outpatient Visits   None     Future Appointments             In 2 months Baity, Rankin Buzzard, NP [place] [facility]            Passed - Last BP in normal range    BP Readings from Last 1 Encounters:  12/11/23 108/64         Passed - Last Heart Rate in normal range    Pulse Readings from Last 1 Encounters:  10/24/23 (!) 103

## 2024-04-23 ENCOUNTER — Ambulatory Visit: Admitting: Advanced Practice Midwife

## 2024-04-23 DIAGNOSIS — F329 Major depressive disorder, single episode, unspecified: Secondary | ICD-10-CM | POA: Diagnosis not present

## 2024-04-29 ENCOUNTER — Encounter: Payer: Self-pay | Admitting: Advanced Practice Midwife

## 2024-04-30 ENCOUNTER — Encounter: Payer: Self-pay | Admitting: Internal Medicine

## 2024-05-02 ENCOUNTER — Other Ambulatory Visit: Payer: Self-pay | Admitting: Internal Medicine

## 2024-05-06 NOTE — Telephone Encounter (Signed)
 Requested Prescriptions  Pending Prescriptions Disp Refills   esomeprazole  (NEXIUM ) 40 MG capsule [Pharmacy Med Name: ESOMEPRAZOLE  MAGNESIUM  40 MG CAP] 90 capsule 0    Sig: TAKE 1 CAPSULE BY MOUTH ONCE DAILY     Gastroenterology: Proton Pump Inhibitors 2 Failed - 05/06/2024 10:07 AM      Failed - Valid encounter within last 12 months    Recent Outpatient Visits   None     Future Appointments             In 1 month Baity, Rankin Buzzard, NP [facility]            Passed - ALT in normal range and within 360 days    ALT  Date Value Ref Range Status  06/09/2023 14 6 - 29 U/L Final         Passed - AST in normal range and within 360 days    AST  Date Value Ref Range Status  06/09/2023 13 10 - 30 U/L Final          simvastatin  (ZOCOR ) 10 MG tablet [Pharmacy Med Name: SIMVASTATIN  10 MG TAB] 90 tablet 0    Sig: TAKE 1 TABLET BY MOUTH AT BEDTIME     Cardiovascular:  Antilipid - Statins Failed - 05/06/2024 10:07 AM      Failed - Valid encounter within last 12 months    Recent Outpatient Visits   None     Future Appointments             In 1 month Baity, Rankin Buzzard, NP [place] [facility]            Failed - Lipid Panel in normal range within the last 12 months    Cholesterol, Total  Date Value Ref Range Status  03/31/2023 166 100 - 199 mg/dL Final   Cholesterol  Date Value Ref Range Status  06/09/2023 209 (H) <200 mg/dL Final   LDL Cholesterol (Calc)  Date Value Ref Range Status  06/09/2023 123 (H) mg/dL (calc) Final    Comment:    Reference range: <100 . Desirable range <100 mg/dL for primary prevention;   <70 mg/dL for patients with CHD or diabetic patients  with > or = 2 CHD risk factors. [name] LDL-C is now calculated using the Martin-Hopkins  calculation, which is a validated novel method providing  better accuracy than the Friedewald equation in the  estimation of LDL-C.  [name] et al. [name].  4742;595(63): 2061-2068  (http://education.[employer].com/faq/FAQ164)    HDL  Date Value Ref Range Status  06/09/2023 60 > OR = 50 mg/dL Final  87/56/4332 50 >95 mg/dL Final   Triglycerides  Date Value Ref Range Status  06/09/2023 148 <150 mg/dL Final         Passed - Patient is not pregnant

## 2024-05-11 ENCOUNTER — Other Ambulatory Visit
Admission: RE | Admit: 2024-05-11 | Discharge: 2024-05-11 | Disposition: A | Discharge status: Home / Self Care | Payer: Self-pay | Source: Ambulatory Visit | Attending: Medical Genetics | Admitting: Medical Genetics

## 2024-05-21 DIAGNOSIS — F330 Major depressive disorder, recurrent, mild: Secondary | ICD-10-CM | POA: Diagnosis not present

## 2024-05-21 DIAGNOSIS — F4312 Post-traumatic stress disorder, chronic: Secondary | ICD-10-CM | POA: Diagnosis not present

## 2024-05-21 DIAGNOSIS — F900 Attention-deficit hyperactivity disorder, predominantly inattentive type: Secondary | ICD-10-CM | POA: Diagnosis not present

## 2024-05-24 LAB — GENECONNECT MOLECULAR SCREEN: Genetic Analysis Overall Interpretation: NEGATIVE

## 2024-05-30 ENCOUNTER — Other Ambulatory Visit: Payer: Self-pay | Admitting: Internal Medicine

## 2024-06-01 NOTE — Telephone Encounter (Signed)
 Requested Prescriptions  Pending Prescriptions Disp Refills   fluticasone  (FLONASE ) 50 MCG/ACT nasal spray [Pharmacy Med Name: FLUTICASONE  PROPIONATE 50 MCG/ACT N] 16 g 0    Sig: PLACE 1 SPRAY INTO BOTH NOSTRILS ONCE DAILY     Ear, Nose, and Throat: Nasal Preparations - Corticosteroids Failed - 06/01/2024 10:50 AM      Failed - Valid encounter within last 12 months    Recent Outpatient Visits   None     Future Appointments             In 1 week Baity, Angeline ORN, NP  [facility]

## 2024-06-12 ENCOUNTER — Ambulatory Visit: Payer: Self-pay | Admitting: Internal Medicine

## 2024-06-13 ENCOUNTER — Ambulatory Visit: Admitting: Internal Medicine
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
## 2024-06-27 ENCOUNTER — Other Ambulatory Visit: Payer: Self-pay | Admitting: Internal Medicine

## 2024-06-27 ENCOUNTER — Other Ambulatory Visit: Payer: Self-pay | Admitting: Advanced Practice Midwife

## 2024-06-27 DIAGNOSIS — Z30019 Encounter for initial prescription of contraceptives, unspecified: Secondary | ICD-10-CM

## 2024-06-27 DIAGNOSIS — R0602 Shortness of breath: Secondary | ICD-10-CM

## 2024-07-01 NOTE — Telephone Encounter (Signed)
 OV 12/11/23 Requested Prescriptions  Pending Prescriptions Disp Refills   montelukast  (SINGULAIR ) 10 MG tablet [Pharmacy Med Name: MONTELUKAST  SODIUM 10 MG TAB] 90 tablet 1    Sig: TAKE 1 TABLET BY MOUTH ONCE DAILY AT BEDTIME     Pulmonology:  Leukotriene Inhibitors Failed - 07/01/2024  4:17 PM      Failed - Valid encounter within last 12 months    Recent Outpatient Visits   None             fluticasone  (FLONASE ) 50 MCG/ACT nasal spray [Pharmacy Med Name: FLUTICASONE  PROPIONATE 50 MCG/ACT N] 16 g 0    Sig: PLACE 1 SPRAY INTO BOTH NOSTRILS ONCE DAILY     Ear, Nose, and Throat: Nasal Preparations - Corticosteroids Failed - 07/01/2024  4:17 PM      Failed - Valid encounter within last 12 months    Recent Outpatient Visits   None

## 2024-07-26 ENCOUNTER — Other Ambulatory Visit: Payer: Self-pay | Admitting: Internal Medicine

## 2024-07-26 NOTE — Telephone Encounter (Signed)
 Requested Prescriptions  Pending Prescriptions Disp Refills   fluticasone  (FLONASE ) 50 MCG/ACT nasal spray [Pharmacy Med Name: FLUTICASONE  PROPIONATE 50 MCG/ACT N] 16 g 0    Sig: PLACE 1 SPRAY INTO BOTH NOSTRILS ONCE DAILY     Ear, Nose, and Throat: Nasal Preparations - Corticosteroids Failed - 07/26/2024  5:58 PM      Failed - Valid encounter within last 12 months    Recent Outpatient Visits   None

## 2024-08-30 DIAGNOSIS — F329 Major depressive disorder, single episode, unspecified: Secondary | ICD-10-CM | POA: Diagnosis not present

## 2024-08-30 DIAGNOSIS — F4312 Post-traumatic stress disorder, chronic: Secondary | ICD-10-CM | POA: Diagnosis not present

## 2024-08-30 DIAGNOSIS — F900 Attention-deficit hyperactivity disorder, predominantly inattentive type: Secondary | ICD-10-CM | POA: Diagnosis not present

## 2024-09-10 DIAGNOSIS — F4312 Post-traumatic stress disorder, chronic: Secondary | ICD-10-CM | POA: Diagnosis not present

## 2024-09-10 DIAGNOSIS — F900 Attention-deficit hyperactivity disorder, predominantly inattentive type: Secondary | ICD-10-CM | POA: Diagnosis not present

## 2024-09-20 ENCOUNTER — Other Ambulatory Visit: Payer: Self-pay | Admitting: Internal Medicine

## 2024-09-23 NOTE — Telephone Encounter (Signed)
 Unable to refill per protocol, appointment needed.   Requested Prescriptions  Pending Prescriptions Disp Refills   gabapentin  (NEURONTIN ) 300 MG capsule [Pharmacy Med Name: GABAPENTIN  300 MG CAP] 270 capsule 0    Sig: TAKE 1 CAPSULE BY MOUTH ONCE EVERY MORNING AND TAKE 2 CAPSULES ONCE EVERY EVENING     Neurology: Anticonvulsants - gabapentin  Failed - 09/23/2024 12:12 PM      Failed - Cr in normal range and within 360 days    Creat  Date Value Ref Range Status  06/09/2023 0.66 0.50 - 0.97 mg/dL Final         Failed - Valid encounter within last 12 months    Recent Outpatient Visits   None            Passed - Completed PHQ-2 or PHQ-9 in the last 360 days

## 2024-09-24 DIAGNOSIS — F4312 Post-traumatic stress disorder, chronic: Secondary | ICD-10-CM | POA: Diagnosis not present

## 2024-09-24 DIAGNOSIS — F900 Attention-deficit hyperactivity disorder, predominantly inattentive type: Secondary | ICD-10-CM | POA: Diagnosis not present

## 2024-10-06 DIAGNOSIS — M542 Cervicalgia: Secondary | ICD-10-CM | POA: Diagnosis not present

## 2024-10-06 DIAGNOSIS — M545 Low back pain, unspecified: Secondary | ICD-10-CM | POA: Diagnosis not present

## 2024-10-16 ENCOUNTER — Other Ambulatory Visit: Payer: Self-pay | Admitting: Advanced Practice Midwife

## 2024-10-16 DIAGNOSIS — Z30019 Encounter for initial prescription of contraceptives, unspecified: Secondary | ICD-10-CM

## 2024-10-21 ENCOUNTER — Telehealth: Payer: Self-pay | Admitting: Internal Medicine

## 2024-10-21 DIAGNOSIS — Z30019 Encounter for initial prescription of contraceptives, unspecified: Secondary | ICD-10-CM

## 2024-10-21 NOTE — Telephone Encounter (Unsigned)
 Copied from CRM #[id number]. Topic: Clinical - Medication Refill >> Oct 21, 2024  2:57 PM Ivette P wrote: Medication: norethindrone -ethinyl estradiol  1/35 (NORTREL  1/35, 21,) tablet   Has the patient contacted their pharmacy? Yes (Agent: If no, request that the patient contact the pharmacy for the refill. If patient does not wish to contact the pharmacy document the reason why and proceed with request.) (Agent: If yes, when and what did the pharmacy advise?)  This is the patient's preferred pharmacy:  [id number] [place], [place] - [address] AT [facility] [address] [place] [address] Phone: [phone] Fax: [phone]  Is this the correct pharmacy for this prescription? Yes If no, delete pharmacy and type the correct one.   Has the prescription been filled recently? Yes  Is the patient out of the medication? Yes, pt has already gone 1 day without.   Has the patient been seen for an appointment in the last year OR does the patient have an upcoming appointment? Yes  Can we respond through MyChart? Yes  Agent: Please be advised that Rx refills may take up to 3 business days. We ask that you follow-up with your pharmacy.

## 2024-10-22 ENCOUNTER — Encounter: Payer: Self-pay | Admitting: Internal Medicine

## 2024-10-22 DIAGNOSIS — F329 Major depressive disorder, single episode, unspecified: Secondary | ICD-10-CM | POA: Diagnosis not present

## 2024-10-22 DIAGNOSIS — Z30019 Encounter for initial prescription of contraceptives, unspecified: Secondary | ICD-10-CM

## 2024-10-22 MED ORDER — NORTREL 1/35 (21) 1-35 MG-MCG PO TABS: 1.0000 | ORAL_TABLET | Freq: Every day | ORAL | 0 refills | Status: DC

## 2024-10-22 NOTE — Addendum Note (Signed)
 Addended by: ANTONETTE ANGELINE ORN on: 10/22/2024 11:29 AM   Modules accepted: Orders

## 2024-10-24 NOTE — Telephone Encounter (Signed)
 Called [facility] pharmacy to verify if patient picked up Rx. Pharmacy staff reports patient picked up Rx on 10/22/24.  Current request is for Rx to be sent to [facility] pharmacy .

## 2024-10-24 NOTE — Telephone Encounter (Signed)
 Requested by patient . Patient already picked up Rx at [facility] pharmacy on 10/22/24. Patient requesting on 10/21/24 to be sent to different pharmacy [facility]. Duplicate request.  Requested Prescriptions  Refused Prescriptions Disp Refills   norethindrone -ethinyl estradiol  1/35 (NORTREL  1/35, 21,) tablet 28 tablet 0    Sig: Take 1 tablet by mouth daily.     OB/GYN:  Contraceptives Failed - 10/24/2024  2:49 PM      Failed - Valid encounter within last 12 months    Recent Outpatient Visits   None            Passed - Last BP in normal range    BP Readings from Last 1 Encounters:  12/11/23 108/64         Passed - Patient is not a smoker

## 2024-10-29 ENCOUNTER — Ambulatory Visit: Admitting: Internal Medicine

## 2024-10-29 ENCOUNTER — Other Ambulatory Visit (HOSPITAL_COMMUNITY)
Admission: RE | Admit: 2024-10-29 | Discharge: 2024-10-29 | Disposition: A | Discharge status: Home / Self Care | Source: Ambulatory Visit | Attending: Internal Medicine | Admitting: Internal Medicine

## 2024-10-29 ENCOUNTER — Encounter: Payer: Self-pay | Admitting: Internal Medicine

## 2024-10-29 VITALS — BP 112/74 | Ht 62.0 in | Wt 139.4 lb

## 2024-10-29 DIAGNOSIS — A6004 Herpesviral vulvovaginitis: Secondary | ICD-10-CM

## 2024-10-29 DIAGNOSIS — K219 Gastro-esophageal reflux disease without esophagitis: Secondary | ICD-10-CM

## 2024-10-29 DIAGNOSIS — M5412 Radiculopathy, cervical region: Secondary | ICD-10-CM

## 2024-10-29 DIAGNOSIS — Z113 Encounter for screening for infections with a predominantly sexual mode of transmission: Secondary | ICD-10-CM

## 2024-10-29 DIAGNOSIS — J4530 Mild persistent asthma, uncomplicated: Secondary | ICD-10-CM | POA: Diagnosis not present

## 2024-10-29 DIAGNOSIS — Z23 Encounter for immunization: Secondary | ICD-10-CM | POA: Diagnosis not present

## 2024-10-29 DIAGNOSIS — G43709 Chronic migraine without aura, not intractable, without status migrainosus: Secondary | ICD-10-CM | POA: Diagnosis not present

## 2024-10-29 DIAGNOSIS — F900 Attention-deficit hyperactivity disorder, predominantly inattentive type: Secondary | ICD-10-CM

## 2024-10-29 DIAGNOSIS — F5105 Insomnia due to other mental disorder: Secondary | ICD-10-CM

## 2024-10-29 DIAGNOSIS — E7800 Pure hypercholesterolemia, unspecified: Secondary | ICD-10-CM

## 2024-10-29 DIAGNOSIS — F339 Major depressive disorder, recurrent, unspecified: Secondary | ICD-10-CM

## 2024-10-29 DIAGNOSIS — F4310 Post-traumatic stress disorder, unspecified: Secondary | ICD-10-CM

## 2024-10-29 NOTE — Assessment & Plan Note (Signed)
 Not medicated Will monitor

## 2024-10-29 NOTE — Assessment & Plan Note (Signed)
 Continue adderall 20 mg daily as prescribed by psychiatry

## 2024-10-29 NOTE — Assessment & Plan Note (Signed)
 Stable on lamotrigine  150 mg daily Support offered She will continue to follow with psychiatry

## 2024-10-29 NOTE — Assessment & Plan Note (Signed)
 Avoid foods that trigger your reflux Continue esomeprazole  40 mg daily prn

## 2024-10-29 NOTE — Assessment & Plan Note (Signed)
 Will check lipid profile at annual exam Encouraged her to consume a low fat diet Noncompliant with simvastatin 

## 2024-10-29 NOTE — Assessment & Plan Note (Signed)
 Stable on lamotrigine  159 mg daily Support offered She will continue to follow with psychiatry

## 2024-10-29 NOTE — Patient Instructions (Signed)
 Preventing Sexually Transmitted Infections, Adult Sexually transmitted infections (STIs) are spread from person to person (are contagious). They are spread, or transmitted, during sex. The sex may be vaginal, anal, or oral. STIs can be passed during sexual contact with skin, genitals, mouth, or rectum. They may spread through body fluids, such as saliva, semen, blood, vaginal mucus, and urine. STIs are very common. They can happen in people of all ages. Some common STIs are: Herpes. Hepatitis B. Chlamydia. Gonorrhea. Syphilis. Trichomoniasis. Human papillomavirus (HPV). Human immunodeficiency virus (HIV). This can cause acquired immunodeficiency syndrome (AIDS). How can STIs affect me? You may not have symptoms with an STI. Even if you do not have symptoms, you can still spread the infection to others. You also still need treatment. STIs can be treated. Some STIs can be cured. Other STIs cannot be cured and will affect you for the rest of your life. Certain STIs may: Require you to take medicine for the rest of your life. Affect your ability to have children. Increase your risk for getting other STIs. Increase your risk of getting certain conditions. These may include: Cervical cancer. Pelvic inflammatory disease (PID). Organ damage or damage to other parts of your body. This can happen if the infection spreads. Cause problems during pregnancy. STIs may be spread to the baby during pregnancy or birth. Females tend to have more severe problems from STIs than males. What can increase my risk? You may be more at risk for an STI if: You do not use protection during sex. You have more than one sex partner. You have a sex partner who has other sex partners. You have sex with a person who has an STI. You have an STI, or you have had an STI before. You inject drugs or have a sex partner who injects drugs. What actions can I take to prevent STIs? The only way to fully prevent STIs is not to  have sex of any kind. This is called practicing abstinence. If you are sexually active, you can protect yourself and others by taking these actions to lower your risk of getting an STI: Lifestyle Have only one sex partner or limit the number of sex partners you have. Avoid having sex after you have alcohol or drugs. Alcohol and drugs can affect your ability to make good choices. This can lead to risky sexual behaviors. Go to prevention counseling. This can teach you how to avoid getting an STI. Barrier protection  Use methods to stop body fluids from being exchanged between partners during sex (barrier protection). These methods can be used during oral, vaginal, or anal sex. They include: External condom, for males. Internal condom, for females. Dental dam. Use a new barrier method for every sex act from start to finish. Know that a barrier method may not protect you from all STIs. Some STIs, such as herpes, are spread through skin-to-skin contact. Avoid all sexual contact if you or a partner has herpes and there is an active flare with open sores. Birth control pills, injections, implants, and intrauterine devices (IUDs) do not protect against STIs. To prevent both STIs and pregnancy, always use a condom with a second form of birth control. General information Ask your health care provider about taking pre-exposure prophylaxis (PrEP) to prevent HIV. Stay up to date on your vaccines. Some vaccines can lower your risk of getting certain STIs. These include: Hepatitis B vaccine. HPV vaccine. This is recommended for people up to age 66. Get tested for STIs. Have your  partners get tested, too. If you test positive for an STI, follow recommendations from your health care provider about treatment. Make sure your sex partners are tested and treated as well. Where to find more information Learn more about STIs from: Centers for Disease Control and Prevention (CDC): More information about certain  STIs: TonerPromos.no Places to get sexual health counseling and treatment for free or at a low cost: gettested.TonerPromos.no U.S. Department of Health and Human Services [facility]): TravelLesson.ca This information is not intended to replace advice given to you by your health care provider. Make sure you discuss any questions you have with your health care provider. Document Revised: 11/17/2022 Document Reviewed: 04/22/2022 Elsevier Patient Education  2024 [employer].

## 2024-10-29 NOTE — Assessment & Plan Note (Signed)
 Continue trazadone 50 mg at bedtime as prescribed by psychiatry Will monitor

## 2024-10-29 NOTE — Assessment & Plan Note (Signed)
 Will discontinue dulera  due to nonuse Continue montelukast  10 mg daily and albuterol  108 mcg/act every 4-6 hours as needed Will monitor

## 2024-10-29 NOTE — Assessment & Plan Note (Signed)
 Continue celocoxiv 100 mg BID, baclofen  10 mg at bedtime prn and gabapentin  300 mg in am 600 mg QHS Continue to follow with neurosurgery

## 2024-10-29 NOTE — Assessment & Plan Note (Signed)
 Continue qulipta 60 mg daily, eletriptan 10 md daily prn, baclofen  10 mg at bedtime prn and gabapentin  300 mg in am and 600 mg in pm as prescribed Continue to follow with neurology
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
## 2024-10-30 ENCOUNTER — Ambulatory Visit: Payer: Self-pay | Admitting: Internal Medicine

## 2024-10-30 LAB — HEPATITIS C ANTIBODY: Hepatitis C Ab: NONREACTIVE

## 2024-10-30 LAB — SYPHILIS: RPR W/REFLEX TO RPR TITER AND TREPONEMAL ANTIBODIES, TRADITIONAL SCREENING AND DIAGNOSIS ALGORITHM: RPR Ser Ql: NONREACTIVE

## 2024-10-30 LAB — HIV ANTIBODY (ROUTINE TESTING W REFLEX)
HIV 1&2 Ab, 4th Generation: NONREACTIVE
HIV FINAL INTERPRETATION: NEGATIVE

## 2024-11-04 LAB — CERVICOVAGINAL ANCILLARY ONLY
Chlamydia: NEGATIVE
Comment: NEGATIVE
Comment: NEGATIVE
Comment: NORMAL
Neisseria Gonorrhea: NEGATIVE
Trichomonas: NEGATIVE

## 2024-11-05 ENCOUNTER — Encounter: Payer: Self-pay | Admitting: Internal Medicine

## 2024-11-05 DIAGNOSIS — F329 Major depressive disorder, single episode, unspecified: Secondary | ICD-10-CM | POA: Diagnosis not present

## 2024-11-13 ENCOUNTER — Encounter: Payer: Self-pay | Admitting: Emergency Medicine

## 2024-11-13 ENCOUNTER — Ambulatory Visit
Admission: EM | Admit: 2024-11-13 | Discharge: 2024-11-13 | Disposition: A | Discharge status: Home / Self Care | Attending: Emergency Medicine | Admitting: Emergency Medicine

## 2024-11-13 DIAGNOSIS — R350 Frequency of micturition: Secondary | ICD-10-CM | POA: Insufficient documentation

## 2024-11-13 LAB — POCT URINE DIPSTICK
Bilirubin, UA: NEGATIVE
Glucose, UA: NEGATIVE mg/dL
Nitrite, UA: NEGATIVE
POC PROTEIN,UA: >=300 — AB
Spec Grav, UA: 1.025
Urobilinogen, UA: 0.2 U/dL
pH, UA: 6

## 2024-11-13 MED ORDER — NITROFURANTOIN MONOHYD MACRO 100 MG PO CAPS: 100.0000 mg | ORAL_CAPSULE | Freq: Two times a day (BID) | ORAL | 0 refills | Status: DC

## 2024-11-13 NOTE — ED Triage Notes (Signed)
 Patient reports urinary frequency, painful urination, and chill x 1 day. Patient took took AZO with mild relief.
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
## 2024-11-13 NOTE — Discharge Instructions (Addendum)
 Your urinalysis shows [name] blood cells but at this time does not show bacteria, your urine will be sent to the lab to determine exactly which bacteria is present, if any changes need to be made to your medications you will be notified  Begin use of Macrobid  twice daily for 5 days  You may use over-the-counter Azo to help minimize your symptoms until antibiotic removes bacteria, this medication will turn your urine orange  Increase your fluid intake through use of water  As always practice good hygiene, wiping front to back and avoidance of scented vaginal products to prevent further irritation  If symptoms continue to persist after use of medication or recur please follow-up with urgent care or your primary doctor as needed

## 2024-11-15 ENCOUNTER — Ambulatory Visit (HOSPITAL_COMMUNITY): Payer: Self-pay

## 2024-11-15 LAB — URINE CULTURE: Culture: 30000 — AB

## 2024-11-19 ENCOUNTER — Other Ambulatory Visit: Payer: Self-pay | Admitting: Internal Medicine

## 2024-11-19 ENCOUNTER — Other Ambulatory Visit: Payer: Self-pay | Admitting: Advanced Practice Midwife

## 2024-11-19 ENCOUNTER — Ambulatory Visit: Payer: Self-pay | Admitting: Internal Medicine

## 2024-11-19 DIAGNOSIS — Z30019 Encounter for initial prescription of contraceptives, unspecified: Secondary | ICD-10-CM

## 2024-11-19 MED ORDER — SIMVASTATIN 10 MG PO TABS: 10.0000 mg | ORAL_TABLET | Freq: Every day | ORAL | 1 refills | Status: AC

## 2024-11-19 MED ORDER — NORTREL 1/35 (21) 1-35 MG-MCG PO TABS: 1.0000 | ORAL_TABLET | Freq: Every day | ORAL | 1 refills | Status: DC

## 2024-11-19 MED ORDER — NORTREL 1/35 (21) 1-35 MG-MCG PO TABS: 1.0000 | ORAL_TABLET | Freq: Every day | ORAL | 1 refills | Status: AC

## 2024-11-19 NOTE — Telephone Encounter (Signed)
 FYI Only or Action Required?: FYI only for provider: appointment scheduled on 12.31.25.  Patient was last seen in primary care on 10/29/2024 by Antonette Angeline ORN, NP.  Called Nurse Triage reporting Fatigue and Cough.  Symptoms began several days ago.  Interventions attempted: OTC medications:  SABRA  Symptoms are: gradually worsening.  Triage Disposition: See Physician Within 24 Hours  Patient/caregiver understands and will follow disposition?: Yes   Pt is also requesting refill on BC medication.    Copied from CRM #[id number]. Topic: Clinical - Red Word Triage >> Nov 19, 2024 11:36 AM Rea ORN wrote: Red Word that prompted transfer to Nurse Triage: congestion, body aches, sweats, nausea, chills then hot, increased mucus. Sx began a couple days ago.  Pt request virtual visit. Reason for Disposition  [1] MODERATE weakness (e.g., interferes with work, school, normal activities) AND [2] persists > 3 days  Answer Assessment - Initial Assessment Questions Nasal congestion, sweats then cold (states temperature has been normal), diarrhea, body aches, shortness of breath with coughing and movement, headache, cough, sneezing, fatigue (states out of it). Rn asked pt to clarify, she states she is just excessively tired and that's why she is requesting an appt virtually. She is having nausea no vomiting. She states shortness of breath is with coughing, chest feels heavy with coughing.  States it has been going on for a couple of days and seems to be getting worse. States the fatigue is the most bothersome symptoms for her.     1. ONSET: When did the cough begin?      Couple of days ago 2. SEVERITY: How bad is the cough today?      Mild to mod 3. SPUTUM: Describe the color of your sputum (e.g., none, dry cough; clear, white, yellow, green)     States a little but unknown what color 4. HEMOPTYSIS: Are you coughing up any blood? If Yes, ask: How much? (e.g., flecks, streaks, tablespoons,  etc.)     denies 5. DIFFICULTY BREATHING: Are you having difficulty breathing? If Yes, ask: How bad is it? (e.g., mild, moderate, severe)      mild 6. FEVER: Do you have a fever? If Yes, ask: What is your temperature, how was it measured, and when did it start?     States hot and cold but temp measured normal 7. CARDIAC HISTORY: Do you have any history of heart disease? (e.g., heart attack, congestive heart failure)      No  8. LUNG HISTORY: Do you have any history of lung disease?  (e.g., pulmonary embolus, asthma, emphysema)     asthma 9. OTHER SYMPTOMS: Do you have any other symptoms? (e.g., runny nose, wheezing, chest pain)       Chest heaviness  Answer Assessment - Initial Assessment Questions 1. DESCRIPTION: Describe how you are feeling.     Fatigue, very tired 2. SEVERITY: How bad is it?  Can you stand and walk?     States she can walk 3. ONSET: When did these symptoms begin? (e.g., hours, days, weeks, months)     A couple of days ago 4. CAUSE: What do you think is causing the weakness or fatigue? (e.g., not drinking enough fluids, medical problem, trouble sleeping)     Thinks its what's causing rest of symptoms. States she has been drinking plenty of fluid 5. OTHER SYMPTOMS: Do you have any other symptoms? (e.g., chest pain, fever, cough, SOB, vomiting, diarrhea, bleeding, other areas of pain)     Heaviness, cough,  shortness of breath with coughing or exertion, diarrhea  Protocols used: Weakness (Generalized) and Fatigue-A-AH, Cough - Acute Non-Productive-A-AH
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
## 2024-11-19 NOTE — Telephone Encounter (Signed)
"  Will discuss at upcoming appt   "

## 2024-11-20 ENCOUNTER — Other Ambulatory Visit: Payer: Self-pay | Admitting: Advanced Practice Midwife

## 2024-11-20 ENCOUNTER — Encounter: Payer: Self-pay | Admitting: Internal Medicine

## 2024-11-20 ENCOUNTER — Other Ambulatory Visit: Payer: Self-pay | Admitting: Internal Medicine

## 2024-11-20 ENCOUNTER — Telehealth: Admitting: Internal Medicine

## 2024-11-20 DIAGNOSIS — J069 Acute upper respiratory infection, unspecified: Secondary | ICD-10-CM | POA: Diagnosis not present

## 2024-11-20 DIAGNOSIS — R6889 Other general symptoms and signs: Secondary | ICD-10-CM

## 2024-11-20 DIAGNOSIS — Z30019 Encounter for initial prescription of contraceptives, unspecified: Secondary | ICD-10-CM

## 2024-11-20 DIAGNOSIS — A084 Viral intestinal infection, unspecified: Secondary | ICD-10-CM

## 2024-11-20 MED ORDER — ONDANSETRON 4 MG PO TBDP: 4.0000 mg | ORAL_TABLET | Freq: Three times a day (TID) | ORAL | 0 refills | Status: AC | PRN

## 2024-11-20 NOTE — Patient Instructions (Signed)
 Viral Illness, Adult Viruses are tiny germs that can get into a person's body and cause illness. There are many different types of viruses. And they cause many types of illness. Viral illnesses can range from mild to severe. They can affect various parts of the body. Short-term conditions that are caused by a virus include colds and flu (influenza) and stomach viruses. Long-term conditions that are caused by a virus include herpes, shingles, and human immunodeficiency virus (HIV) infection. A few viruses have been linked to certain cancers. What are the causes? Many types of viruses can cause illness. Viruses get into cells in your body, multiply, and cause the infected cells to work differently or die. When these cells die, they release more of the virus. When this happens, you get symptoms of the illness and the virus spreads to other cells. If the virus takes over how the cell works, it can cause the cell to divide and grow out of control. This happens when a virus causes cancer. Different viruses get into the body in different ways. You can get a virus by: Swallowing food or water that has come in contact with the virus. Breathing in droplets that have been coughed or sneezed into the air by an infected person. Touching a surface that has the virus on it and then touching your eyes, nose, or mouth. Being bitten by an insect or animal that carries the virus. Having sexual contact with a person who is infected with the virus. Being exposed to blood or fluids that contain the virus, either through an open cut or during a transfusion. If a virus enters your body, your body's disease-fighting system (immune system) will try to fight the virus. You may be at higher risk for a viral illness if your immune system is weak. What are the signs or symptoms? Symptoms depend on the type of virus and the location of the cells that it gets into. Symptoms can include: For cold and flu  viruses: Fever. Headache. Sore throat. Muscle aches. Stuffy nose (nasal congestion). Cough. For stomach (gastrointestinal) viruses: Fever. Pain in the abdomen. Nausea or vomiting. Diarrhea. For liver viruses (hepatitis): Loss of appetite. Feeling tired. Skin or the white parts of your eyes turning yellow (jaundice). For brain and spinal cord viruses: Fever. Headache. Stiff neck. Nausea and vomiting. Confusion or being sleepy. For skin viruses: Warts. Itching. Rash. For sexually transmitted viruses: Discharge. Swelling. Redness. Rash. How is this diagnosed? This condition may be diagnosed based on one or more of these: Your symptoms and medical history. A physical exam. Tests, such as: Blood tests. Tests on a sample of mucus from your lungs (sputum sample). Tests on a poop (stool) sample. Tests on a swab of body fluids or a skin sore (lesion). How is this treated? Viruses can be hard to treat because they live within cells. Antibiotics do not treat viruses because these medicines do not get inside cells. Treatment for a viral illness may include: Resting and drinking a lot of fluids. Medicines to treat symptoms. These can include over-the-counter medicine for pain and fever, medicines for cough or congestion, and medicines for diarrhea. Antiviral medicines. These medicines are available only for certain types of viruses. Some viral illnesses can be prevented with vaccinations. A common example is the flu shot. Follow these instructions at home: Medicines Take over-the-counter and prescription medicines only as told by your health care provider. If you were prescribed an antiviral medicine, take it as told by your provider. Do not stop  taking the antiviral even if you start to feel better. Know when antibiotics are needed and when they are not needed. Antibiotics do not treat viruses. You may get an antibiotic if your provider thinks that you may have, or are at risk  for, a bacterial infection and you have a viral infection. Do not ask for an antibiotic prescription if you have been diagnosed with a viral illness. Antibiotics will not make your illness go away faster. Taking antibiotics when they are not needed can lead to antibiotic resistance. When this develops, the medicine no longer works against the bacteria that it normally fights. General instructions Drink enough fluids to keep your pee (urine) pale yellow. Rest as much as possible. Return to your normal activities as told by your provider. Ask your provider what activities are safe for you. How is this prevented? To lower your risk of getting another viral illness: Wash your hands often with soap and water for at least 20 seconds. If soap and water are not available, use hand sanitizer. Avoid touching your nose, eyes, and mouth, especially if you have not washed your hands recently. If anyone in your household has a viral infection, clean all household surfaces that may have been in contact with the virus. Use soap and hot water. You may also use a commercially prepared, bleach-containing solution. Stay away from people who are sick with symptoms of a viral infection. Do not share items such as toothbrushes and water bottles with other people. Keep your vaccinations up to date. This includes getting a yearly flu shot. Eat a healthy diet and get plenty of rest. Contact a health care provider if: You have symptoms of a viral illness that do not go away. Your symptoms come back after going away. Your symptoms get worse. Get help right away if: You have trouble breathing. You have a severe headache or a stiff neck. You have severe vomiting or pain in your abdomen. These symptoms may be an emergency. Get help right away. Call 911. Do not wait to see if the symptoms will go away. Do not drive yourself to the hospital. This information is not intended to replace advice given to you by your health  care provider. Make sure you discuss any questions you have with your health care provider. Document Revised: 11/23/2022 Document Reviewed: 09/07/2022 Elsevier Patient Education  2024 [employer].

## 2024-11-21 NOTE — Telephone Encounter (Signed)
 Already refilled on 11/19/24 in a separate refill encounter.Will refuse this request due to this.  Requested Prescriptions  Pending Prescriptions Disp Refills   NORTREL  1/35, 28, tablet [Pharmacy Med Name: NORTREL  1/35 (28) 1-35 MG-MCG TAB] 28 tablet     Sig: TAKE 1 TABLET BY MOUTH ONCE DAILY     OB/GYN:  Contraceptives Passed - 11/21/2024  8:50 AM      Passed - Last BP in normal range    BP Readings from Last 1 Encounters:  11/13/24 127/83         Passed - Valid encounter within last 12 months    Recent Outpatient Visits           Yesterday Viral URI with cough   [place] [facility] [place], Angeline ORN, NP   3 weeks ago Screen for STD (sexually transmitted disease)   [facility] [place], Angeline ORN, [place]              Passed - Patient is not a smoker

## 2024-11-21 NOTE — Telephone Encounter (Signed)
 Requested Prescriptions  Pending Prescriptions Disp Refills   gabapentin  (NEURONTIN ) 300 MG capsule [Pharmacy Med Name: GABAPENTIN  300 MG CAP] 270 capsule 1    Sig: TAKE 1 CAPSULE BY MOUTH ONCE EVERY MORNING AND TAKE 2 CAPSULES ONCE EVERY EVENING     Neurology: Anticonvulsants - gabapentin  Failed - 11/21/2024  8:52 AM      Failed - Cr in normal range and within 360 days    Creat  Date Value Ref Range Status  06/09/2023 0.66 0.50 - 0.97 mg/dL Final         Passed - Completed PHQ-2 or PHQ-9 in the last 360 days      Passed - Valid encounter within last 12 months    Recent Outpatient Visits           Yesterday Viral URI with cough   [place] [facility] [place], Angeline ORN, NP   3 weeks ago Screen for STD (sexually transmitted disease)   [place] [facility] [place], Angeline ORN, NP

## 2024-11-26 ENCOUNTER — Encounter: Payer: Self-pay | Admitting: Internal Medicine

## 2024-11-26 VITALS — BP 120/74 | Ht 62.0 in | Wt 134.0 lb

## 2024-11-26 DIAGNOSIS — R1013 Epigastric pain: Secondary | ICD-10-CM | POA: Diagnosis not present

## 2024-11-26 DIAGNOSIS — R1031 Right lower quadrant pain: Secondary | ICD-10-CM

## 2024-11-26 DIAGNOSIS — R110 Nausea: Secondary | ICD-10-CM | POA: Diagnosis not present

## 2024-11-26 DIAGNOSIS — K219 Gastro-esophageal reflux disease without esophagitis: Secondary | ICD-10-CM | POA: Diagnosis not present

## 2024-11-26 DIAGNOSIS — J00 Acute nasopharyngitis [common cold]: Secondary | ICD-10-CM

## 2024-11-26 MED ORDER — PROMETHAZINE HCL 12.5 MG PO TABS: 12.5000 mg | ORAL_TABLET | Freq: Three times a day (TID) | ORAL | 0 refills | Status: AC | PRN

## 2024-11-26 MED ORDER — AZITHROMYCIN 250 MG PO TABS: ORAL_TABLET | ORAL | 0 refills | Status: AC
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
## 2024-11-26 NOTE — Patient Instructions (Signed)
 Abdominal Pain, Adult    Many things can cause belly (abdominal) pain. In most cases, belly pain is not a serious problem and can be watched and treated at home. But in some cases, it can be serious.  Your doctor will try to find the cause of your belly pain.  Follow these instructions at home:  Medicines  Take over-the-counter and prescription medicines only as told by your doctor.  Do not take medicines that help you poop (laxatives) unless told by your doctor.  General instructions  Watch your belly pain for any changes. Tell your doctor if the pain gets worse.  Drink enough fluid to keep your pee (urine) pale yellow.  Contact a doctor if:  Your belly pain changes or gets worse.  You have very bad cramping or bloating in your belly.  You vomit.  Your pain gets worse with meals, after eating, or with certain foods.  You have trouble pooping or have watery poop for more than 2-3 days.  You are not hungry, or you lose weight without trying.  You have signs of not getting enough fluid or water (dehydration). These may include:  Dark pee, very Chastain pee, or no pee.  Cracked lips or dry mouth.  Feeling sleepy or weak.  You have pain when you pee or poop.  Your belly pain wakes you up at night.  You have blood in your pee.  You have a fever.  Get help right away if:  You cannot stop vomiting.  Your pain is only in one part of your belly, like on the right side.  You have bloody or black poop, or poop that looks like tar.  You have trouble breathing.  You have chest pain.  These symptoms may be an emergency. Get help right away. Call 911.  Do not wait to see if the symptoms will go away.  Do not drive yourself to the hospital.  This information is not intended to replace advice given to you by your health care provider. Make sure you discuss any questions you have with your health care provider.  Document Revised: 08/24/2022 Document Reviewed: 08/24/2022  Elsevier Patient Education  2024 [employer].

## 2024-11-27 LAB — COMPREHENSIVE METABOLIC PANEL WITH GFR
AG Ratio: 1.7 (calc) (ref 1.0–2.5)
ALT: 67 U/L — ABNORMAL HIGH (ref 6–29)
AST: 22 U/L (ref 10–30)
Albumin: 3.9 g/dL (ref 3.6–5.1)
Alkaline phosphatase (APISO): 59 U/L (ref 31–125)
BUN: 13 mg/dL (ref 7–25)
CO2: 27 mmol/L (ref 20–32)
Calcium: 8.9 mg/dL (ref 8.6–10.2)
Chloride: 105 mmol/L (ref 98–110)
Creat: 0.71 mg/dL (ref 0.50–0.97)
Globulin: 2.3 g/dL (ref 1.9–3.7)
Glucose, Bld: 103 mg/dL (ref 65–139)
Potassium: 4 mmol/L (ref 3.5–5.3)
Sodium: 141 mmol/L (ref 135–146)
Total Bilirubin: 0.8 mg/dL (ref 0.2–1.2)
Total Protein: 6.2 g/dL (ref 6.1–8.1)
eGFR: 113 mL/min/1.73m2

## 2024-11-27 LAB — CBC
HCT: 36 % (ref 35.9–46.0)
Hemoglobin: 12.1 g/dL (ref 11.7–15.5)
MCH: 31 pg (ref 27.0–33.0)
MCHC: 33.6 g/dL (ref 31.6–35.4)
MCV: 92.3 fL (ref 81.4–101.7)
MPV: 10.7 fL (ref 7.5–12.5)
Platelets: 334 Thousand/uL (ref 140–400)
RBC: 3.9 Million/uL (ref 3.80–5.10)
RDW: 12.8 % (ref 11.0–15.0)
WBC: 7.3 Thousand/uL (ref 3.8–10.8)

## 2024-11-28 ENCOUNTER — Ambulatory Visit: Payer: Self-pay | Admitting: Internal Medicine

## 2024-12-14 ENCOUNTER — Other Ambulatory Visit: Payer: Self-pay | Admitting: Internal Medicine

## 2024-12-14 ENCOUNTER — Other Ambulatory Visit: Payer: Self-pay | Admitting: Advanced Practice Midwife

## 2024-12-14 DIAGNOSIS — Z30019 Encounter for initial prescription of contraceptives, unspecified: Secondary | ICD-10-CM

## 2024-12-16 NOTE — Telephone Encounter (Signed)
 Requested Prescriptions  Pending Prescriptions Disp Refills   esomeprazole  (NEXIUM ) 40 MG capsule [Pharmacy Med Name: ESOMEPRAZOLE  MAGNESIUM  40 MG CAP] 90 capsule 0    Sig: TAKE 1 CAPSULE BY MOUTH ONCE DAILY     Gastroenterology: Proton Pump Inhibitors 2 Failed - 12/16/2024 12:56 PM      Failed - ALT in normal range and within 360 days    ALT  Date Value Ref Range Status  11/26/2024 67 (H) 6 - 29 U/L Final         Passed - AST in normal range and within 360 days    AST  Date Value Ref Range Status  11/26/2024 22 10 - 30 U/L Final         Passed - Valid encounter within last 12 months    Recent Outpatient Visits           2 weeks ago RLQ abdominal pain   [place] [facility] [place], Angeline ORN, NP   3 weeks ago Viral URI with cough   [place] [facility] [place], Angeline ORN, NP   1 month ago Screen for STD (sexually transmitted disease)    [place] [facility] [place], Angeline ORN, NP

## 2024-12-25 ENCOUNTER — Other Ambulatory Visit: Payer: Self-pay | Admitting: Advanced Practice Midwife

## 2024-12-25 ENCOUNTER — Other Ambulatory Visit: Payer: Self-pay | Admitting: Internal Medicine

## 2024-12-25 DIAGNOSIS — Z30019 Encounter for initial prescription of contraceptives, unspecified: Secondary | ICD-10-CM

## 2024-12-27 NOTE — Telephone Encounter (Signed)
 Requested medications are due for refill today.  unsure  Requested medications are on the active medications list.  yes  Last refill. 11/26/2024  Future visit scheduled.   yes  Notes to clinic.  Medication is historical    Requested Prescriptions  Pending Prescriptions Disp Refills   fluticasone  (FLONASE ) 50 MCG/ACT nasal spray [Pharmacy Med Name: FLUTICASONE  PROPIONATE 50 MCG/ACT N] 16 g     Sig: PLACE 1 SPRAY INTO BOTH NOSTRILS ONCE DAILY     Ear, Nose, and Throat: Nasal Preparations - Corticosteroids Passed - 12/27/2024 12:32 PM      Passed - Valid encounter within last 12 months    Recent Outpatient Visits           1 month ago RLQ abdominal pain   [place] [facility] [place], Angeline ORN, NP   1 month ago Viral URI with cough   [place] [facility] [place], Angeline ORN, NP   1 month ago Screen for STD (sexually transmitted disease)   [facility] [place], Angeline ORN, NP

## 2025-01-24 ENCOUNTER — Encounter: Admitting: Internal Medicine
